# Patient Record
Sex: Male | Born: 1935 | Race: White | Hispanic: No | Smoking: Former smoker
Health system: Southern US, Community
[De-identification: ages and names within clinical notes are randomized; demographics above are authoritative.]

## PROBLEM LIST (undated history)

## (undated) DIAGNOSIS — I714 Abdominal aortic aneurysm, without rupture, unspecified: Secondary | ICD-10-CM

## (undated) DIAGNOSIS — I2510 Atherosclerotic heart disease of native coronary artery without angina pectoris: Secondary | ICD-10-CM

## (undated) DIAGNOSIS — M1990 Unspecified osteoarthritis, unspecified site: Secondary | ICD-10-CM

## (undated) DIAGNOSIS — E785 Hyperlipidemia, unspecified: Secondary | ICD-10-CM

## (undated) DIAGNOSIS — H3530 Unspecified macular degeneration: Secondary | ICD-10-CM

## (undated) DIAGNOSIS — K449 Diaphragmatic hernia without obstruction or gangrene: Secondary | ICD-10-CM

## (undated) DIAGNOSIS — I10 Essential (primary) hypertension: Secondary | ICD-10-CM

## (undated) DIAGNOSIS — D649 Anemia, unspecified: Secondary | ICD-10-CM

## (undated) DIAGNOSIS — R1310 Dysphagia, unspecified: Secondary | ICD-10-CM

## (undated) DIAGNOSIS — I739 Peripheral vascular disease, unspecified: Secondary | ICD-10-CM

## (undated) DIAGNOSIS — C801 Malignant (primary) neoplasm, unspecified: Secondary | ICD-10-CM

## (undated) DIAGNOSIS — K219 Gastro-esophageal reflux disease without esophagitis: Secondary | ICD-10-CM

## (undated) HISTORY — DX: Abdominal aortic aneurysm, without rupture, unspecified: I71.40

## (undated) HISTORY — DX: Atherosclerotic heart disease of native coronary artery without angina pectoris: I25.10

## (undated) HISTORY — DX: Peripheral vascular disease, unspecified: I73.9

## (undated) HISTORY — PX: SPINE SURGERY: SHX786

## (undated) HISTORY — DX: Unspecified osteoarthritis, unspecified site: M19.90

## (undated) HISTORY — DX: Unspecified macular degeneration: H35.30

## (undated) HISTORY — DX: Anemia, unspecified: D64.9

## (undated) HISTORY — DX: Gastro-esophageal reflux disease without esophagitis: K21.9

## (undated) HISTORY — PX: CORONARY ANGIOPLASTY WITH STENT PLACEMENT: SHX49

## (undated) HISTORY — DX: Essential (primary) hypertension: I10

## (undated) HISTORY — PX: TONSILLECTOMY: SUR1361

## (undated) HISTORY — DX: Diaphragmatic hernia without obstruction or gangrene: K44.9

## (undated) HISTORY — DX: Dysphagia, unspecified: R13.10

## (undated) HISTORY — DX: Abdominal aortic aneurysm, without rupture: I71.4

## (undated) HISTORY — DX: Malignant (primary) neoplasm, unspecified: C80.1

## (undated) HISTORY — PX: EUS: SHX5427

## (undated) HISTORY — DX: Hyperlipidemia, unspecified: E78.5

---

## 1999-04-23 ENCOUNTER — Encounter: Payer: Self-pay | Admitting: Emergency Medicine

## 1999-04-23 ENCOUNTER — Emergency Department (HOSPITAL_COMMUNITY): Admission: EM | Admit: 1999-04-23 | Discharge: 1999-04-23 | Payer: Self-pay | Admitting: Emergency Medicine

## 1999-11-07 ENCOUNTER — Encounter: Payer: Self-pay | Admitting: Internal Medicine

## 1999-11-07 ENCOUNTER — Inpatient Hospital Stay (HOSPITAL_COMMUNITY): Admission: EM | Admit: 1999-11-07 | Discharge: 1999-11-08 | Payer: Self-pay | Admitting: Emergency Medicine

## 2000-01-06 ENCOUNTER — Encounter: Admission: RE | Admit: 2000-01-06 | Discharge: 2000-01-06 | Payer: Self-pay | Admitting: Urology

## 2000-01-06 ENCOUNTER — Encounter: Payer: Self-pay | Admitting: Urology

## 2001-07-01 ENCOUNTER — Encounter: Admission: RE | Admit: 2001-07-01 | Discharge: 2001-07-01 | Payer: Self-pay | Admitting: Urology

## 2001-07-01 ENCOUNTER — Encounter: Payer: Self-pay | Admitting: Urology

## 2001-08-05 ENCOUNTER — Ambulatory Visit (HOSPITAL_COMMUNITY): Admission: RE | Admit: 2001-08-05 | Discharge: 2001-08-05 | Payer: Self-pay | Admitting: *Deleted

## 2001-11-30 ENCOUNTER — Encounter: Payer: Self-pay | Admitting: Emergency Medicine

## 2001-11-30 ENCOUNTER — Encounter: Payer: Self-pay | Admitting: Internal Medicine

## 2001-11-30 ENCOUNTER — Inpatient Hospital Stay (HOSPITAL_COMMUNITY): Admission: EM | Admit: 2001-11-30 | Discharge: 2001-12-06 | Payer: Self-pay | Admitting: Emergency Medicine

## 2001-12-01 ENCOUNTER — Encounter: Payer: Self-pay | Admitting: Internal Medicine

## 2001-12-02 ENCOUNTER — Encounter: Payer: Self-pay | Admitting: Internal Medicine

## 2003-04-20 ENCOUNTER — Inpatient Hospital Stay (HOSPITAL_COMMUNITY): Admission: EM | Admit: 2003-04-20 | Discharge: 2003-04-22 | Payer: Self-pay | Admitting: Emergency Medicine

## 2003-08-05 ENCOUNTER — Emergency Department (HOSPITAL_COMMUNITY): Admission: EM | Admit: 2003-08-05 | Discharge: 2003-08-05 | Payer: Self-pay | Admitting: Emergency Medicine

## 2003-08-05 ENCOUNTER — Encounter: Payer: Self-pay | Admitting: Emergency Medicine

## 2004-04-20 ENCOUNTER — Emergency Department (HOSPITAL_COMMUNITY): Admission: EM | Admit: 2004-04-20 | Discharge: 2004-04-20 | Payer: Self-pay | Admitting: Emergency Medicine

## 2004-08-30 ENCOUNTER — Ambulatory Visit (HOSPITAL_COMMUNITY): Admission: RE | Admit: 2004-08-30 | Discharge: 2004-08-30 | Payer: Self-pay | Admitting: Orthopedic Surgery

## 2004-09-28 ENCOUNTER — Emergency Department (HOSPITAL_COMMUNITY): Admission: EM | Admit: 2004-09-28 | Discharge: 2004-09-28 | Payer: Self-pay | Admitting: *Deleted

## 2005-02-01 ENCOUNTER — Emergency Department (HOSPITAL_COMMUNITY): Admission: EM | Admit: 2005-02-01 | Discharge: 2005-02-01 | Payer: Self-pay | Admitting: Emergency Medicine

## 2005-05-29 ENCOUNTER — Inpatient Hospital Stay (HOSPITAL_COMMUNITY): Admission: AD | Admit: 2005-05-29 | Discharge: 2005-06-01 | Payer: Self-pay | Admitting: Cardiovascular Disease

## 2005-05-29 ENCOUNTER — Emergency Department (HOSPITAL_COMMUNITY): Admission: EM | Admit: 2005-05-29 | Discharge: 2005-05-29 | Payer: Self-pay | Admitting: Emergency Medicine

## 2005-06-05 ENCOUNTER — Ambulatory Visit: Payer: Self-pay | Admitting: Internal Medicine

## 2005-06-07 HISTORY — PX: ESOPHAGOGASTRODUODENOSCOPY: SHX1529

## 2005-06-25 ENCOUNTER — Ambulatory Visit: Payer: Self-pay | Admitting: Internal Medicine

## 2005-06-25 ENCOUNTER — Encounter: Payer: Self-pay | Admitting: Internal Medicine

## 2005-06-25 ENCOUNTER — Ambulatory Visit (HOSPITAL_COMMUNITY): Admission: RE | Admit: 2005-06-25 | Discharge: 2005-06-25 | Payer: Self-pay | Admitting: Internal Medicine

## 2005-09-07 HISTORY — PX: ESOPHAGOGASTRODUODENOSCOPY: SHX1529

## 2005-09-08 ENCOUNTER — Ambulatory Visit (HOSPITAL_COMMUNITY): Admission: RE | Admit: 2005-09-08 | Discharge: 2005-09-08 | Payer: Self-pay | Admitting: Internal Medicine

## 2005-09-08 ENCOUNTER — Ambulatory Visit: Payer: Self-pay | Admitting: Internal Medicine

## 2006-12-08 HISTORY — PX: ESOPHAGOGASTRODUODENOSCOPY: SHX1529

## 2007-02-12 ENCOUNTER — Ambulatory Visit (HOSPITAL_COMMUNITY): Admission: RE | Admit: 2007-02-12 | Discharge: 2007-02-12 | Payer: Self-pay | Admitting: Cardiovascular Disease

## 2007-06-29 ENCOUNTER — Inpatient Hospital Stay (HOSPITAL_COMMUNITY): Admission: EM | Admit: 2007-06-29 | Discharge: 2007-07-02 | Payer: Self-pay | Admitting: Emergency Medicine

## 2007-07-07 ENCOUNTER — Encounter: Admission: RE | Admit: 2007-07-07 | Discharge: 2007-07-07 | Payer: Self-pay | Admitting: Internal Medicine

## 2007-07-15 ENCOUNTER — Ambulatory Visit: Payer: Self-pay | Admitting: Internal Medicine

## 2007-07-26 ENCOUNTER — Ambulatory Visit: Payer: Self-pay | Admitting: Internal Medicine

## 2007-07-26 ENCOUNTER — Ambulatory Visit (HOSPITAL_COMMUNITY): Admission: RE | Admit: 2007-07-26 | Discharge: 2007-07-26 | Payer: Self-pay | Admitting: Internal Medicine

## 2008-04-02 ENCOUNTER — Emergency Department (HOSPITAL_COMMUNITY): Admission: EM | Admit: 2008-04-02 | Discharge: 2008-04-02 | Payer: Self-pay | Admitting: Emergency Medicine

## 2008-08-16 ENCOUNTER — Emergency Department (HOSPITAL_COMMUNITY): Admission: EM | Admit: 2008-08-16 | Discharge: 2008-08-16 | Payer: Self-pay | Admitting: Emergency Medicine

## 2009-04-22 ENCOUNTER — Emergency Department (HOSPITAL_COMMUNITY): Admission: EM | Admit: 2009-04-22 | Discharge: 2009-04-22 | Payer: Self-pay | Admitting: Emergency Medicine

## 2009-07-30 ENCOUNTER — Encounter (INDEPENDENT_AMBULATORY_CARE_PROVIDER_SITE_OTHER): Payer: Self-pay | Admitting: *Deleted

## 2009-08-08 HISTORY — PX: COLONOSCOPY: SHX5424

## 2009-08-09 DIAGNOSIS — G459 Transient cerebral ischemic attack, unspecified: Secondary | ICD-10-CM | POA: Insufficient documentation

## 2009-08-09 DIAGNOSIS — Z9861 Coronary angioplasty status: Secondary | ICD-10-CM

## 2009-08-09 DIAGNOSIS — H3530 Unspecified macular degeneration: Secondary | ICD-10-CM | POA: Insufficient documentation

## 2009-08-09 DIAGNOSIS — E7800 Pure hypercholesterolemia, unspecified: Secondary | ICD-10-CM

## 2009-08-09 DIAGNOSIS — Z87898 Personal history of other specified conditions: Secondary | ICD-10-CM | POA: Insufficient documentation

## 2009-08-09 DIAGNOSIS — I2510 Atherosclerotic heart disease of native coronary artery without angina pectoris: Secondary | ICD-10-CM

## 2009-08-09 DIAGNOSIS — M129 Arthropathy, unspecified: Secondary | ICD-10-CM | POA: Insufficient documentation

## 2009-08-09 DIAGNOSIS — K219 Gastro-esophageal reflux disease without esophagitis: Secondary | ICD-10-CM | POA: Insufficient documentation

## 2009-08-09 DIAGNOSIS — I10 Essential (primary) hypertension: Secondary | ICD-10-CM

## 2009-08-10 ENCOUNTER — Ambulatory Visit: Payer: Self-pay | Admitting: Internal Medicine

## 2009-08-10 DIAGNOSIS — K5909 Other constipation: Secondary | ICD-10-CM | POA: Insufficient documentation

## 2009-08-10 DIAGNOSIS — K921 Melena: Secondary | ICD-10-CM | POA: Insufficient documentation

## 2009-08-14 ENCOUNTER — Encounter: Payer: Self-pay | Admitting: Internal Medicine

## 2009-08-17 ENCOUNTER — Ambulatory Visit: Payer: Self-pay | Admitting: Internal Medicine

## 2009-08-17 ENCOUNTER — Encounter: Payer: Self-pay | Admitting: Internal Medicine

## 2009-08-17 ENCOUNTER — Ambulatory Visit (HOSPITAL_COMMUNITY): Admission: RE | Admit: 2009-08-17 | Discharge: 2009-08-17 | Payer: Self-pay | Admitting: Internal Medicine

## 2009-08-20 ENCOUNTER — Encounter: Payer: Self-pay | Admitting: Internal Medicine

## 2009-11-07 HISTORY — PX: ESOPHAGOGASTRODUODENOSCOPY: SHX1529

## 2009-11-12 ENCOUNTER — Emergency Department (HOSPITAL_COMMUNITY): Admission: EM | Admit: 2009-11-12 | Discharge: 2009-11-12 | Payer: Self-pay | Admitting: Emergency Medicine

## 2009-11-13 ENCOUNTER — Ambulatory Visit: Payer: Self-pay | Admitting: Internal Medicine

## 2009-11-14 ENCOUNTER — Encounter: Payer: Self-pay | Admitting: Internal Medicine

## 2009-11-15 DIAGNOSIS — R1319 Other dysphagia: Secondary | ICD-10-CM | POA: Insufficient documentation

## 2009-11-21 ENCOUNTER — Ambulatory Visit (HOSPITAL_COMMUNITY): Admission: RE | Admit: 2009-11-21 | Discharge: 2009-11-21 | Payer: Self-pay | Admitting: Internal Medicine

## 2009-11-21 ENCOUNTER — Ambulatory Visit: Payer: Self-pay | Admitting: Internal Medicine

## 2010-04-05 ENCOUNTER — Emergency Department (HOSPITAL_COMMUNITY): Admission: EM | Admit: 2010-04-05 | Discharge: 2010-04-05 | Payer: Self-pay | Admitting: Emergency Medicine

## 2010-08-20 ENCOUNTER — Inpatient Hospital Stay (HOSPITAL_COMMUNITY): Admission: EM | Admit: 2010-08-20 | Discharge: 2010-08-23 | Payer: Self-pay | Admitting: Emergency Medicine

## 2010-08-26 ENCOUNTER — Emergency Department (HOSPITAL_COMMUNITY): Admission: EM | Admit: 2010-08-26 | Discharge: 2010-08-26 | Payer: Self-pay | Admitting: Emergency Medicine

## 2010-12-03 ENCOUNTER — Emergency Department (HOSPITAL_COMMUNITY)
Admission: EM | Admit: 2010-12-03 | Discharge: 2010-12-03 | Payer: Self-pay | Source: Home / Self Care | Admitting: Emergency Medicine

## 2010-12-17 ENCOUNTER — Ambulatory Visit
Admission: RE | Admit: 2010-12-17 | Discharge: 2010-12-17 | Payer: Self-pay | Source: Home / Self Care | Attending: Vascular Surgery | Admitting: Vascular Surgery

## 2010-12-29 ENCOUNTER — Encounter: Payer: Self-pay | Admitting: Internal Medicine

## 2010-12-29 ENCOUNTER — Encounter: Payer: Self-pay | Admitting: Cardiovascular Disease

## 2011-02-17 LAB — CBC
HCT: 34 % — ABNORMAL LOW (ref 39.0–52.0)
MCHC: 35 g/dL (ref 30.0–36.0)
Platelets: 125 10*3/uL — ABNORMAL LOW (ref 150–400)
RBC: 3.81 MIL/uL — ABNORMAL LOW (ref 4.22–5.81)
WBC: 5 10*3/uL (ref 4.0–10.5)

## 2011-02-17 LAB — BASIC METABOLIC PANEL
GFR calc Af Amer: >60 mL/min (ref >60)
Glucose, Bld: 115 mg/dL — ABNORMAL HIGH (ref 70–99)
Potassium: 4.4 mEq/L (ref 3.5–5.1)

## 2011-02-17 LAB — DIFFERENTIAL
Basophils Absolute: 0 10*3/uL (ref 0.0–0.1)
Basophils Relative: 1 % (ref 0–1)
Eosinophils Absolute: 0.1 10*3/uL (ref 0.0–0.7)
Neutrophils Relative %: 66 % (ref 43–77)

## 2011-02-20 ENCOUNTER — Inpatient Hospital Stay (HOSPITAL_COMMUNITY)
Admission: RE | Admit: 2011-02-20 | Discharge: 2011-02-22 | DRG: 238 | Disposition: A | Discharge status: Home / Self Care | Payer: Medicare Other | Source: Ambulatory Visit | Attending: Vascular Surgery | Admitting: Vascular Surgery

## 2011-02-20 ENCOUNTER — Ambulatory Visit (HOSPITAL_COMMUNITY): Payer: Medicare Other

## 2011-02-20 DIAGNOSIS — I739 Peripheral vascular disease, unspecified: Secondary | ICD-10-CM | POA: Diagnosis present

## 2011-02-20 DIAGNOSIS — Y9210 Unspecified residential institution as the place of occurrence of the external cause: Secondary | ICD-10-CM | POA: Diagnosis not present

## 2011-02-20 DIAGNOSIS — R339 Retention of urine, unspecified: Secondary | ICD-10-CM | POA: Diagnosis not present

## 2011-02-20 DIAGNOSIS — N401 Enlarged prostate with lower urinary tract symptoms: Secondary | ICD-10-CM | POA: Diagnosis not present

## 2011-02-20 DIAGNOSIS — I2510 Atherosclerotic heart disease of native coronary artery without angina pectoris: Secondary | ICD-10-CM | POA: Diagnosis present

## 2011-02-20 DIAGNOSIS — I723 Aneurysm of iliac artery: Principal | ICD-10-CM | POA: Diagnosis present

## 2011-02-20 DIAGNOSIS — Z7902 Long term (current) use of antithrombotics/antiplatelets: Secondary | ICD-10-CM

## 2011-02-20 DIAGNOSIS — Z7982 Long term (current) use of aspirin: Secondary | ICD-10-CM

## 2011-02-20 DIAGNOSIS — I714 Abdominal aortic aneurysm, without rupture: Secondary | ICD-10-CM

## 2011-02-20 DIAGNOSIS — T81509A Unspecified complication of foreign body accidentally left in body following unspecified procedure, initial encounter: Secondary | ICD-10-CM

## 2011-02-20 DIAGNOSIS — K219 Gastro-esophageal reflux disease without esophagitis: Secondary | ICD-10-CM | POA: Diagnosis present

## 2011-02-20 DIAGNOSIS — Y831 Surgical operation with implant of artificial internal device as the cause of abnormal reaction of the patient, or of later complication, without mention of misadventure at the time of the procedure: Secondary | ICD-10-CM | POA: Diagnosis not present

## 2011-02-20 DIAGNOSIS — IMO0002 Reserved for concepts with insufficient information to code with codable children: Secondary | ICD-10-CM | POA: Diagnosis not present

## 2011-02-20 DIAGNOSIS — N138 Other obstructive and reflux uropathy: Secondary | ICD-10-CM | POA: Diagnosis not present

## 2011-02-20 DIAGNOSIS — T82598A Other mechanical complication of other cardiac and vascular devices and implants, initial encounter: Secondary | ICD-10-CM | POA: Diagnosis not present

## 2011-02-20 LAB — BASIC METABOLIC PANEL
BUN: 10 mg/dL (ref 6–23)
BUN: 9 mg/dL (ref 6–23)
CO2: 27 mEq/L (ref 19–32)
Calcium: 8.3 mg/dL — ABNORMAL LOW (ref 8.4–10.5)
Chloride: 99 mEq/L (ref 96–112)
Chloride: 94 mEq/L — ABNORMAL LOW (ref 96–112)
Creatinine, Ser: 0.79 mg/dL (ref 0.4–1.5)
Creatinine, Ser: 0.94 mg/dL (ref 0.4–1.5)
GFR calc Af Amer: >60 mL/min (ref >60)
GFR calc Af Amer: >60 mL/min (ref >60)
GFR calc non Af Amer: >60 mL/min (ref >60)
Glucose, Bld: 102 mg/dL — ABNORMAL HIGH (ref 70–99)
Glucose, Bld: 115 mg/dL — ABNORMAL HIGH (ref 70–99)
Potassium: 3.7 mEq/L (ref 3.5–5.1)
Potassium: 3.8 mEq/L (ref 3.5–5.1)
Potassium: 4 mEq/L (ref 3.5–5.1)
Sodium: 129 mEq/L — ABNORMAL LOW (ref 135–145)

## 2011-02-20 LAB — CBC
HCT: 31.7 % — ABNORMAL LOW (ref 39.0–52.0)
HCT: 32.3 % — ABNORMAL LOW (ref 39.0–52.0)
Hemoglobin: 11.6 g/dL — ABNORMAL LOW (ref 13.0–17.0)
MCH: 32.7 pg (ref 26.0–34.0)
MCH: 32.8 pg (ref 26.0–34.0)
MCHC: 34.6 g/dL (ref 30.0–36.0)
MCV: 93.4 fL (ref 78.0–100.0)
Platelets: 146 10*3/uL — ABNORMAL LOW (ref 150–400)
Platelets: 173 10*3/uL (ref 150–400)
RBC: 3.46 MIL/uL — ABNORMAL LOW (ref 4.22–5.81)
RBC: 3.5 MIL/uL — ABNORMAL LOW (ref 4.22–5.81)
RDW: 14.1 % (ref 11.5–15.5)
WBC: 6.7 10*3/uL (ref 4.0–10.5)
WBC: 9.2 10*3/uL (ref 4.0–10.5)

## 2011-02-20 LAB — DIFFERENTIAL
Basophils Absolute: 0 10*3/uL (ref 0.0–0.1)
Basophils Relative: 0 % (ref 0–1)
Eosinophils Absolute: 0.1 10*3/uL (ref 0.0–0.7)
Eosinophils Absolute: 0 10*3/uL (ref 0.0–0.7)
Eosinophils Relative: 2 % (ref 0–5)
Eosinophils Relative: 0 % (ref 0–5)
Lymphocytes Relative: 16 % (ref 12–46)
Lymphocytes Relative: 17 % (ref 12–46)
Lymphs Abs: 1.1 10*3/uL (ref 0.7–4.0)
Lymphs Abs: 0.6 10*3/uL — ABNORMAL LOW (ref 0.7–4.0)
Monocytes Absolute: 0.9 10*3/uL (ref 0.1–1.0)
Monocytes Relative: 10 % (ref 3–12)
Monocytes Relative: 10 % (ref 3–12)
Monocytes Relative: 9 % (ref 3–12)
Neutro Abs: 4.8 10*3/uL (ref 1.7–7.7)
Neutro Abs: 6.6 10*3/uL (ref 1.7–7.7)
Neutrophils Relative %: 71 % (ref 43–77)

## 2011-02-20 LAB — POCT I-STAT, CHEM 8
Calcium, Ion: 1.15 mmol/L (ref 1.12–1.32)
Chloride: 96 mEq/L (ref 96–112)
Creatinine, Ser: 0.9 mg/dL (ref 0.4–1.5)
Glucose, Bld: 91 mg/dL (ref 70–99)
Potassium: 3.9 mEq/L (ref 3.5–5.1)
Sodium: 131 mEq/L — ABNORMAL LOW (ref 135–145)

## 2011-02-20 LAB — URINE CULTURE

## 2011-02-20 LAB — URINE MICROSCOPIC-ADD ON

## 2011-02-20 LAB — LIPID PANEL
Cholesterol: 118 mg/dL (ref 0–200)
Total CHOL/HDL Ratio: 2.5 RATIO
VLDL: 8 mg/dL (ref 0–40)

## 2011-02-20 LAB — URINALYSIS, ROUTINE W REFLEX MICROSCOPIC: Glucose, UA: NEGATIVE mg/dL

## 2011-02-21 LAB — BASIC METABOLIC PANEL
BUN: 9 mg/dL (ref 6–23)
CO2: 26 mEq/L (ref 19–32)
CO2: 25 mEq/L (ref 19–32)
Calcium: 8.2 mg/dL — ABNORMAL LOW (ref 8.4–10.5)
GFR calc non Af Amer: >60 mL/min (ref >60)
Glucose, Bld: 106 mg/dL — ABNORMAL HIGH (ref 70–99)
Glucose, Bld: 120 mg/dL — ABNORMAL HIGH (ref 70–99)
Potassium: 3.9 mEq/L (ref 3.5–5.1)
Sodium: 125 mEq/L — ABNORMAL LOW (ref 135–145)

## 2011-02-21 LAB — CBC
HCT: 33.6 % — ABNORMAL LOW (ref 39.0–52.0)
Hemoglobin: 11.6 g/dL — ABNORMAL LOW (ref 13.0–17.0)
MCH: 31 pg (ref 26.0–34.0)
MCHC: 34.5 g/dL (ref 30.0–36.0)

## 2011-02-22 LAB — URINALYSIS, ROUTINE W REFLEX MICROSCOPIC
Bilirubin Urine: NEGATIVE
Glucose, UA: NEGATIVE mg/dL
Ketones, ur: NEGATIVE mg/dL
Nitrite: NEGATIVE
pH: 6.5 (ref 5.0–8.0)

## 2011-02-23 LAB — URINE CULTURE
Colony Count: NO GROWTH
Culture  Setup Time: 201203171746
Culture: NO GROWTH

## 2011-02-23 NOTE — Consult Note (Addendum)
  [name], [name] NO.:  [id number]  MEDICAL RECORD NO.:  [id number]           PATIENT TYPE:  LOCATION:                                 FACILITY:  PHYSICIAN:  Jeremy Purpura, MD      DATE OF BIRTH:  09-29-1918  DATE OF CONSULTATION:  02/21/2011 DATE OF DISCHARGE:                                CONSULTATION   REASON FOR CONSULTATION:  Urinary retention.  PHYSICIAN REQUESTING CONSULTATION:  Jeremy Hertz, MD  HISTORY:  [name] is a 77 year old gentleman who has been followed by [name] for benign prostatic hyperplasia.  He chronically takes Uroxatral and finasteride for benign prostatic hyperplasia.  On February 20, 2011, he underwent a right internal iliac arterial embolization and subsequently underwent removal of a migrated coil which had migrated into the right common femoral artery.  He subsequently underwent a voiding trial which he failed.  His Foley catheter was replaced, and he was given a second voiding trial which he also failed requiring Foley catheter placement again.  PAST MEDICAL HISTORY: 1. Hypertension. 2. Peripheral vascular disease. 3. Dyslipidemia. 4. Coronary artery disease status post cardiac stent and angioplasty. 5. Gastroesophageal reflux disease. 6. Benign prostatic hyperplasia. 7. Macular degeneration. 8. History of transient ischemic attack.  PAST SURGICAL HISTORY: 1. Tonsillectomy. 2. History of cardiac stent placement.  MEDICATIONS:  Current medications include Uroxatral, Norvasc, aspirin, Plavix, ferrous sulfate, finasteride, metoprolol, omega-3 fatty acid, Protonix, Crestor.  ALLERGIES:  HYDROCODONE results in GI upset but no true drug allergy.  FAMILY HISTORY:  There is a maternal history of congestive heart failure.  The patient's father died at age 76 secondary to pneumonia.  SOCIAL HISTORY:  He is widowed.  He is retired.  He has a remote history of smoking.  He denies alcohol use.  REVIEW OF  SYSTEMS:  Complete review of systems was reviewed.  Pertinent positives are as included in the history of present illness.  PHYSICAL EXAMINATION:  CONSTITUTIONAL:  A well-nourished, well- developed, age-appropriate male, in no acute distress. VITAL SIGNS:  Temperature 99.5, heart rate 79, blood pressure 154/75. NECK:  Supple without JVD. CARDIOVASCULAR:  Regular rate and rhythm. LUNGS:  Normal respiratory effort. ABDOMEN:  No suprapubic distention. GENITOURINARY:  The patient has an indwelling Foley catheter draining grossly clear urine. EXTREMITIES:  Nontender. NEUROLOGIC:  Grossly intact.  IMPRESSION:  Postoperative urinary retention.  PLAN:  I think it would be appropriate to give him one more chance of voiding while hospitalized tomorrow morning.  If he fails this voiding trial, I would then recommend replacement of his Foley catheter and discharge home with an indwelling Foley catheter.  He should continue both his Uroxatral and finasteride.  If he does not pass his voiding trial, he should then follow up in the next 1-2 weeks for an outpatient voiding trial.     Jeremy Purpura, MD     LB/MEDQ  D:  02/21/2011  T:  02/22/2011  Job:  [id number]  cc:   Jeremy Hertz, MD  Electronically Signed by Jeremy Purpura MD on 02/23/2011 04:44:39 PM

## 2011-02-25 ENCOUNTER — Ambulatory Visit (INDEPENDENT_AMBULATORY_CARE_PROVIDER_SITE_OTHER): Payer: Medicare Other | Admitting: Vascular Surgery

## 2011-02-25 DIAGNOSIS — R109 Unspecified abdominal pain: Secondary | ICD-10-CM

## 2011-02-25 NOTE — Op Note (Signed)
[name], [name] NO.:  [id number]  MEDICAL RECORD NO.:  [id number]           PATIENT TYPE:  I  LOCATION:  2030                         FACILITY:  [facility]  PHYSICIAN:  Jeremy Hertz, MD       DATE OF BIRTH:  [birth year]  DATE OF PROCEDURE:  02/20/2011 DATE OF DISCHARGE:                              OPERATIVE REPORT   PROCEDURE:  Right common femoral artery exploration and removal of [name] coil from right common femoral artery.  PREOPERATIVE DIAGNOSIS:  Right common femoral artery coil migration.  POSTOPERATIVE DIAGNOSIS:  Right common femoral artery coil migration.  SURGEON:  Jeremy John L. Imogene Burn, MD  ASSISTANT:  Jeremy Earthly, MD  ANESTHESIA:  General.  FINDINGS:  [name] coil in right common femoral artery which was removed in its entirety and posterior plaquing in the right common femoral artery with an intact common femoral artery lumen.  SPECIMEN:  [name] coil.  Disposition of the specimen was to put pathology.  ESTIMATED BLOOD LOSS:  Minimal.  INDICATIONS:  This is a 77 year old gentleman that had undergone a right internal iliac artery embolization earlier today with inadvertent migration of a coil down into the right common femoral artery.  The patient was brought back to the operating room with a plan to remove this inadvertently migrant [name] coil to maintain perfusion in his right leg.  The patient is aware of the risks of this procedure including bleeding, infection, and possible need for additional surgical procedures depending on the findings intraoperatively.  DESCRIPTION OF OPERATION:  After full informed written consent had been obtained from the patient's family, the patient was brought back to the operating room and placed supine upon the operating table.  Prior to induction, he had received IV antibiotics.  He was then prepped and draped in standard fashion for right femoral artery cutdown.  I made a longitudinal incision over the  right common femoral artery and dissected down to the artery using blunt dissection with electrocautery.  Note, there was an extensive amount of inflammatory changes around the right common femoral artery.  I was able to dissect out a segment at the level of the [name] coil proximally and distally and gained control for vascular clamp placement.  I gave 5000 units of heparin intravenously and  then after waiting 3 minutes, I then made an arteriotomy with a #11 blade, extending it slightly with a Potts scissor.  Immediately, I could see the [name] coil.  The Potts scissor had cut part of the coil but I was able to extract all 3 pieces.  Under fluoroscopic guidance, we re- interrogated the right groin and there was no coil fragment present.  At this point, we also looked further down in the leg and there was no evidence of any embolized coil material.  At this point, I irrigated out this artery and then it was closed with a running stitch of 6-0 Prolene. The groin was then irrigated out.  I then repaired the femoral sheath with  a 2-0 Vicryl and then an additional layer of 2-0 Vicryl in the deep subcutaneous  tissue was placed.  Then the subcutaneous tissue was further reapproximated with  a double layer of 3-0 Vicryl and then the skin was cleaned, dried, and a 4-0 Monocryl  was used to reapproximate the skin.  The skin closure was then reinforced with Dermabond.  At this point, the patient was allowed to awaken without any difficulties.  The plan is to admit him for observation overnight and  then discharge in the morning.  There were no complications in this case.  The patient was in stable condition.       Jeremy Hertz, MD     BLC/MEDQ  D:  02/20/2011  T:  02/21/2011  Job:  [id number]  Electronically Signed by Jeremy Sake MD on 02/25/2011 10:55:07 AM

## 2011-02-25 NOTE — Op Note (Signed)
[name], [name] NO.:  [id number]  MEDICAL RECORD NO.:  [id number]           PATIENT TYPE:  I  LOCATION:  2030                         FACILITY:  [facility]  PHYSICIAN:  Fransisco Hertz, MD       DATE OF BIRTH:  Dec 25, 1917  DATE OF PROCEDURE:  02/20/2011 DATE OF DISCHARGE:                              OPERATIVE REPORT   PROCEDURES: 1. Left common femoral artery cannulation under ultrasound guidance. 2. Aortogram. 3. Right internal iliac artery embolization with Nestor coils x4.  PREOPERATIVE DIAGNOSIS:  Right common iliac artery aneurysm.  POSTOPERATIVE DIAGNOSIS:  Right common iliac artery aneurysm.  SURGEON:  Fransisco Hertz, MD  ANESTHESIA:  Conscious sedation.  ESTIMATED BLOOD LOSS:  Minimal.  CONTRAST:  175 mL.  FINDINGS:  The findings in this case included: 1. Patent aorta. 2. Patent superior mesenteric artery and iliac artery. 3. No inferior mesenteric artery was visualized. 4. The right common iliac artery aneurysm was visualized about 2 cm in     diameter intraluminally. 5. The right internal iliac artery was embolized with four Nestor     coils. 6. There was accidental migration, the last Nestor coil into the right     common femoral artery with the delivery catheter recoiled and     inadvertently pulled the coil back into the external iliac artery     dislodging the corneal and allowing it to drop down into the right     common femoral artery. 7. At the end of the case, there was decreased, but persistent flow in     the right internal iliac artery at the point of termination of this     case.  INDICATIONS:  This is a 77 year old gentleman with a greater than 4-cm right common iliac artery aneurysm that is in process of being scheduled for endovascular repair.  In order to facilitate an EVAR repair of his aneurysm, he needed the right internal iliac artery coiling procedure completed.  We discussed the risks of this procedure included  access, complications, bleeding, infection, possible embolization, possible need for emergent surgical intervention and possible renal failure due to dye load.  He is aware of these risks and agreed to proceed forward such.  DESCRIPTION OF THE OPERATION:  After full informed written consent was obtained, he was brought back to the angio suite and placed supine upon the angio table.  Prior to induction, he was connected to monitoring equipment and he was given conscious sedation in amounts of which were as documented in his electronic chart.  He was then prepped and draped in standard fashion for possible bilateral femoral access.  I turned my attention first to his left groin.  Under ultrasound guidance, I interrogated the artery on the left side.  There was a good bit of posterior plaque noted, but the common femoral artery appeared to be intact.  I then cannulated this artery with micropuncture needle and passed the microwire up into the aorta.  The needle was exchanged for a micro-sheath, through the micro-sheath a Bentson wire was advanced up into the aorta.  At  this point, the sheath was exchanged for regular 5-French sheath, which was advanced into the common femoral artery.  The dilator was removed and then a pigtail catheter was loaded and advanced to the level of L1.  Power injections were completed via this catheter in both anterior-posterior projection and also lateral projections, this demonstrated patency of the SMA and celiac artery, also identified the location of the renals and then demonstrated no inferior mesenteric artery, and then I pulled down the catheter to the level of the bifurcation and did oblique views on the pelvis to try to rotate the internal iliac artery of the common iliac artery.  In the deep right anterior oblique view, we were able visualize the internal.  I then exchanged the catheter out for a Kumpe catheter.  Using this, I was able to cross the  bifurcation and advanced my wire down into the internal iliac artery without any difficulties.  I was able to track the catheter down into the internal iliac artery.  I then started delivering 10-mm Nestor coils into this artery.  I placed total of 3 Nestors and then did another hand injection.  At this point, I noted that the flow within this internal iliac artery orifice was already decreased based on decreased ability to aspirate, but on the hand injections, there was decreased but persistent flow in this internal iliac artery, so at this point, I felt that an additional coil was going to be necessary.  We started delivering a 10 mm Nestor in the usual fashion as the previous three.  I tried to get better delivery of the coil by manipulating the direction of delivery of the coil.  With manipulation of the catheter, the catheter recoiled superiorly up toward the aortic bifurcation about 2-3 cm.  In this process, this pulled the coil that was being delivered out of the internal iliac artery.  At this point, the coil was already in the common iliac artery and upon releasing the coil, migrated down into the right common femoral artery.  At this point I felt that the priority would be to maintain profusion of his right leg and that there was already some evidence that the left internal iliac artery was in the process of clotting.  So at this point, I terminated the case, and pulled out the catheter and wire and then the left femoral sheath was pulled and after about a 15-minute pressure hold to this artery, the patient was then brought to the operating room emergently for exploration in the right common femoral artery and extraction of this migrant Nestor coil.  Complication was inadvertent migration of coil to right common femoral artery. The condition of the patient was stable.     Fransisco Hertz, MD     BLC/MEDQ  D:  02/20/2011  T:  02/21/2011  Job:  [id number]  Electronically  Signed by Leonides Sake MD on 02/25/2011 10:51:50 AM

## 2011-02-26 NOTE — Assessment & Plan Note (Signed)
OFFICE VISIT  [name], REEP DOB:  06/07/1918                                       02/25/2011 ZOXWR#:[id number]  The patient presents today for followup of recent coiling of his right internal iliac and removal of a coil from his right groin.  He had undergone coiling in preparation for stent graft repair of large right common iliac artery aneurysm.  He had an interim procedural complication of dislodgement of coil and this lodged in his common femoral artery. He was taken to the operating room by Dr. Leonides Sake and had uneventful removal of the coil from the common femoral artery.  He was discharged to home on postop day #2 following this.  He presents today for followup.  He does report some soreness in his groin incision but otherwise is doing fine.  We did discuss the procedure for stent graft repair of his aneurysm and have tentatively scheduled this for 2 weeks on April 2nd.  We will see him one further time between now and then to confirm that he is prepared for surgery.    [name], M.D. Electronically Signed  TFE/MEDQ  D:  02/25/2011  T:  02/26/2011  Job:  [id number]  cc:   Fransisco Hertz, MD

## 2011-03-06 ENCOUNTER — Encounter (HOSPITAL_COMMUNITY)
Admission: RE | Admit: 2011-03-06 | Discharge: 2011-03-06 | Disposition: A | Discharge status: Home / Self Care | Payer: Medicare Other | Source: Ambulatory Visit | Attending: Vascular Surgery | Admitting: Vascular Surgery

## 2011-03-06 ENCOUNTER — Other Ambulatory Visit (HOSPITAL_COMMUNITY): Payer: Medicare Other

## 2011-03-06 ENCOUNTER — Other Ambulatory Visit: Payer: Self-pay | Admitting: Vascular Surgery

## 2011-03-06 ENCOUNTER — Ambulatory Visit (HOSPITAL_COMMUNITY)
Admission: RE | Admit: 2011-03-06 | Discharge: 2011-03-06 | Disposition: A | Discharge status: Home / Self Care | Payer: Medicare Other | Source: Ambulatory Visit | Attending: Vascular Surgery | Admitting: Vascular Surgery

## 2011-03-06 DIAGNOSIS — I714 Abdominal aortic aneurysm, without rupture, unspecified: Secondary | ICD-10-CM | POA: Insufficient documentation

## 2011-03-06 DIAGNOSIS — J4489 Other specified chronic obstructive pulmonary disease: Secondary | ICD-10-CM | POA: Insufficient documentation

## 2011-03-06 DIAGNOSIS — Z01818 Encounter for other preprocedural examination: Secondary | ICD-10-CM | POA: Insufficient documentation

## 2011-03-06 DIAGNOSIS — J449 Chronic obstructive pulmonary disease, unspecified: Secondary | ICD-10-CM | POA: Insufficient documentation

## 2011-03-06 DIAGNOSIS — Z01811 Encounter for preprocedural respiratory examination: Secondary | ICD-10-CM | POA: Insufficient documentation

## 2011-03-06 DIAGNOSIS — Z01812 Encounter for preprocedural laboratory examination: Secondary | ICD-10-CM | POA: Insufficient documentation

## 2011-03-06 LAB — CBC
HCT: 33.3 % — ABNORMAL LOW (ref 39.0–52.0)
Hemoglobin: 11.6 g/dL — ABNORMAL LOW (ref 13.0–17.0)
MCH: 31.4 pg (ref 26.0–34.0)
MCHC: 34.8 g/dL (ref 30.0–36.0)
RBC: 3.7 MIL/uL — ABNORMAL LOW (ref 4.22–5.81)

## 2011-03-06 LAB — BLOOD GAS, ARTERIAL
Acid-Base Excess: 1.4 mmol/L (ref 0.0–2.0)
Bicarbonate: 25.3 mEq/L — ABNORMAL HIGH (ref 20.0–24.0)
O2 Saturation: 97.4 %
Patient temperature: 98.6
TCO2: 26.4 mmol/L (ref 0–100)

## 2011-03-06 LAB — TYPE AND SCREEN: ABO/RH(D): A NEG

## 2011-03-06 LAB — URINALYSIS, ROUTINE W REFLEX MICROSCOPIC
Bilirubin Urine: NEGATIVE
Glucose, UA: NEGATIVE mg/dL
Ketones, ur: NEGATIVE mg/dL
Specific Gravity, Urine: 1.013 (ref 1.005–1.030)
pH: 7 (ref 5.0–8.0)

## 2011-03-06 LAB — SURGICAL PCR SCREEN
MRSA, PCR: NEGATIVE
Staphylococcus aureus: NEGATIVE

## 2011-03-06 LAB — COMPREHENSIVE METABOLIC PANEL
ALT: 19 U/L (ref 0–53)
AST: 26 U/L (ref 0–37)
Alkaline Phosphatase: 63 U/L (ref 39–117)
Calcium: 8.8 mg/dL (ref 8.4–10.5)
GFR calc Af Amer: >60 mL/min (ref >60)
Glucose, Bld: 110 mg/dL — ABNORMAL HIGH (ref 70–99)
Potassium: 4.5 mEq/L (ref 3.5–5.1)
Sodium: 126 mEq/L — ABNORMAL LOW (ref 135–145)
Total Protein: 6.2 g/dL (ref 6.0–8.3)

## 2011-03-06 LAB — ABO/RH: ABO/RH(D): A NEG

## 2011-03-06 LAB — PROTIME-INR
INR: 1.03 (ref 0.00–1.49)
Prothrombin Time: 13.7 seconds (ref 11.6–15.2)

## 2011-03-09 HISTORY — PX: ANGIOPLASTY / STENTING ILIAC: SUR31

## 2011-03-10 ENCOUNTER — Inpatient Hospital Stay (HOSPITAL_COMMUNITY)
Admission: RE | Admit: 2011-03-10 | Discharge: 2011-03-12 | DRG: 238 | Disposition: A | Discharge status: Skilled Nursing Facility | Payer: Medicare Other | Source: Ambulatory Visit | Attending: Vascular Surgery | Admitting: Vascular Surgery

## 2011-03-10 ENCOUNTER — Inpatient Hospital Stay (HOSPITAL_COMMUNITY): Payer: Medicare Other

## 2011-03-10 DIAGNOSIS — Z7982 Long term (current) use of aspirin: Secondary | ICD-10-CM

## 2011-03-10 DIAGNOSIS — I714 Abdominal aortic aneurysm, without rupture, unspecified: Secondary | ICD-10-CM | POA: Diagnosis present

## 2011-03-10 DIAGNOSIS — I723 Aneurysm of iliac artery: Secondary | ICD-10-CM

## 2011-03-10 DIAGNOSIS — I10 Essential (primary) hypertension: Secondary | ICD-10-CM | POA: Diagnosis present

## 2011-03-10 DIAGNOSIS — N400 Enlarged prostate without lower urinary tract symptoms: Secondary | ICD-10-CM | POA: Diagnosis present

## 2011-03-10 DIAGNOSIS — Z8673 Personal history of transient ischemic attack (TIA), and cerebral infarction without residual deficits: Secondary | ICD-10-CM

## 2011-03-10 DIAGNOSIS — E785 Hyperlipidemia, unspecified: Secondary | ICD-10-CM | POA: Diagnosis present

## 2011-03-10 DIAGNOSIS — H3530 Unspecified macular degeneration: Secondary | ICD-10-CM | POA: Diagnosis present

## 2011-03-10 DIAGNOSIS — K219 Gastro-esophageal reflux disease without esophagitis: Secondary | ICD-10-CM | POA: Diagnosis present

## 2011-03-10 LAB — BASIC METABOLIC PANEL
Calcium: 8.7 mg/dL (ref 8.4–10.5)
GFR calc Af Amer: >60 mL/min (ref >60)
GFR calc non Af Amer: >60 mL/min (ref >60)
Potassium: 3.9 mEq/L (ref 3.5–5.1)
Sodium: 130 mEq/L — ABNORMAL LOW (ref 135–145)

## 2011-03-11 LAB — CBC
MCH: 31.3 pg (ref 26.0–34.0)
MCHC: 34.3 g/dL (ref 30.0–36.0)
MCV: 91.5 fL (ref 78.0–100.0)
Platelets: 143 10*3/uL — ABNORMAL LOW (ref 150–400)
RBC: 3.16 MIL/uL — ABNORMAL LOW (ref 4.22–5.81)
RDW: 14.4 % (ref 11.5–15.5)

## 2011-03-11 LAB — BASIC METABOLIC PANEL
BUN: 9 mg/dL (ref 6–23)
Calcium: 8.2 mg/dL — ABNORMAL LOW (ref 8.4–10.5)
Chloride: 97 mEq/L (ref 96–112)
Creatinine, Ser: 0.8 mg/dL (ref 0.4–1.5)
GFR calc Af Amer: >60 mL/min (ref >60)

## 2011-03-12 LAB — CBC
MCV: 89.7 fL (ref 78.0–100.0)
Platelets: 119 10*3/uL — ABNORMAL LOW (ref 150–400)
RDW: 14.4 % (ref 11.5–15.5)
WBC: 6.1 10*3/uL (ref 4.0–10.5)

## 2011-03-12 LAB — BASIC METABOLIC PANEL
BUN: 9 mg/dL (ref 6–23)
Creatinine, Ser: 0.73 mg/dL (ref 0.4–1.5)
GFR calc Af Amer: >60 mL/min (ref >60)
GFR calc non Af Amer: >60 mL/min (ref >60)
Potassium: 3.9 mEq/L (ref 3.5–5.1)

## 2011-03-14 NOTE — Discharge Summary (Addendum)
NAMELAVOY, Jeremy NO.:  [id number]  MEDICAL RECORD NO.:  [id number]           PATIENT TYPE:  I  LOCATION:  2034                         FACILITY:  [facility]  PHYSICIAN:  [name], M.D.    DATE OF BIRTH:  1918-01-17  DATE OF ADMISSION:  03/10/2011 DATE OF DISCHARGE:  03/12/2011                              DISCHARGE SUMMARY   CHIEF COMPLAINT:  Right common iliac artery aneurysm.  HISTORY OF PRESENT ILLNESS:  [name] is a 77 year old gentleman with greater than 4-cm right common iliac artery aneurysm as well as an abdominal aortic aneurysm.  He is being scheduled for an endovascular repair and previous to this he had an right internal iliac artery coil procedure.  The patient was admitted for endovascular repair of these aneurysms.  PAST MEDICAL HISTORY.: 1. Hypertension. 2. Hyperlipidemia. 3. Large right common iliac artery aneurysm status post right internal     iliac artery embolization with coils and right common femoral     artery exploration with removal of [name] coil. 4. BPH. 5. Gastroesophageal reflex 6. Macular degeneration 7. History of TIA.  HOSPITAL COURSE:  The patient was taken to the operating room on March 10, 2011 for a Gore stent graft of right iliac artery aneurysm and coiling of right internal iliac artery.  The patient's main body device was 31 x 14 x 15 cm.  He had 3 Nestor coils, 14 x 10 mm coils, in the internal iliac artery.  Contralateral limb of the graft was 20 x 9.5 cm and an extension on the other side was 10 x 7 cm.  Postoperatively, the patient did well.  Vital signs were stable.  His hemoglobin and hematocrit were 10 and 29.  He was begun on ambulation.  He had some issues with urinary retention but was able to void in 100 mL amounts. He will be discharged on March 12, 2011.  FINAL DIAGNOSIS: 1. Large right iliac artery aneurysm with abdominal aortic aneurysm     status post Gore Excluder graft. 2. Benign  prostatic hypertrophy.  Some retention issues which resolved     prior to discharge. All his other medical issues were stable while in-house.  DISCHARGE MEDICATIONS: 1. Oxycodone 5 mg 1-2 tablets every 6 hours as needed for pain. 2. Amlodipine 5 mg daily. 3. Aspirin 81 mg daily. 4. Finasteride 5 mg daily. 5. Fish oil 1000 mg daily. 6. Eye vitamins twice daily. 7. Iron 65 mg daily. 8. Lipitor 20 mg daily. 9. Metamucil powder every morning. 10.Metoprolol 12.5 mg twice daily. 11.MiraLax 1 scoop at bedtime. 12.Multivitamins daily. 13.Plavix 75 mg daily. 14.Prevacid 30 mg daily. 15.Tylenol Extra Strength as needed for headache or fever. 16.Alfuzosin 10 mg daily bedtime.  DISPOSITION:  The patient will be discharged to home.  He will follow up with Dr. Arbie Cookey in 4 weeks with a CTA of the abdomen and pelvis.     [name], PA-C   ______________________________ [name], M.D.    RR/MEDQ  D:  03/12/2011  T:  03/12/2011  Job:  [id number]  Electronically Signed by [name]  PA on 03/14/2011 09:41:43 AM Electronically Signed by [name] M.D. on 03/17/2011 12:55:39 PM

## 2011-03-14 NOTE — Discharge Summary (Addendum)
NAMEGERRITT, GALENTINE NO.:  [id number]  MEDICAL RECORD NO.:  [id number]           PATIENT TYPE:  O  LOCATION:  XRAY                         FACILITY:  [facility]  PHYSICIAN:  Fransisco Hertz, MD       DATE OF BIRTH:  04-18-1918  DATE OF ADMISSION:  02/20/2011 DATE OF DISCHARGE:  02/22/2011                              DISCHARGE SUMMARY   DIAGNOSIS:  Infrarenal abdominal aortic aneurysm and large right common iliac artery aneurysm.  PAST MEDICAL HISTORY AND DISCHARGE DIAGNOSES: 1. Hypertension. 2. Hyperlipidemia. 3. Large right common iliac artery aneurysm status post right internal     iliac artery embolization with Nestor coils and status post right     common femoral artery exploration, removal of Nestor coils. 4. Postoperative urinary retention resolved. 5. BPH  BRIEF HISTORY:  The patient is a 77 year old male with a greater than 4 cm right common iliac artery aneurysm as well as abdominal aortic aneurysm that is in the process of being scheduled for endovascular repair.  In order to facilitate the repair of his aneurysm, he had to proceed with a right internal iliac artery coil procedure before.  HOSPITAL COURSE:  The patient was admitted and taken to peripheral vascular lab on February 22, 2011 for a left common femoral artery cannulation under ultrasound guidance, aortogram, and right internal iliac artery embolization with Nestor coils x4.  There was accidental migration of the last Nestor coil into the right common femoral artery with the delivery catheter recoiled and inadvertently pulling the coil back into the external iliac artery.  The coil could not be removed in the peripheral vascular lab.  Therefore, the patient was taken to the OR on February 22, 2011 for right common femoral artery exploration and removal of the Nestor coil from the right common femoral artery.  The patient tolerated both procedures well and was hemodynamically stable immediately  postoperatively.  The patient was transferred from the OR to the Post Anesthesia Care Unit in stable condition.  The patient was extubated without complication and woke up from anesthesia neurologically intact.  The patient's postoperative course was initially complicated by some nausea and vomiting.  This was controlled with medications and subsequently resolved.  He was then able to tolerate regular diet without difficulty.  He also developed a right groin hematoma that appeared to evolve secondary to straining secondary to urinary retention.  The patient's BPH medications were restarted.  The right groin hematoma stabilized and presented no further issue.  The patient did have acute urinary retention after the Foley was discontinued.  His BPH medications were restarted as previously stated, however, he had a Foley replaced twice.  On February 21, 2011, a urology consult was obtained.  Dr. Laverle Patter suggested that the patient undergo an additional void trial and if he is still unable to void, he can be sent home with Foley catheter in place for an outpatient void trial.   On February 22, 2011 the patient was without complaint.  He had a low-grade fever during the night which had resolved.  A UA was checked which was  negative.  On physical exam, cardiac is regular rate and rhythm.  Lungs are clear to auscultation.  The abdomen was benign.  The right groin was soft and ecchymotic and the bilateral lower extremities were warm and well perfused.  The patient passed a void trial and was able to urinate without difficulty.  His fevers resolved and he was ambulating also without difficulty.  The patient continued to do well and was felt stable for discharge home at that time.  LABORATORY DATA:  CBC on February 21, 2011, white count 7.1, hemoglobin 11.6, hematocrit 33.6, platelets 143,000.  BMP on February 21, 2011 sodium 129, potassium 3.9, BUN 9, creatinine 0.74.  DISCHARGE INSTRUCTIONS:  The patient  was given specific written discharge instructions regarding diet, activity, wound care. He was to follow  up with Dr. Imogene Burn in approx 2 weeks.  DISCHARGE MEDICATIONS: 1. Oxycodone 5 mg 1 p.o. q.4 h. p.r.n. pain. 2. Amlodipine 5 mg daily. 3. Aspirin 81 mg daily. 4. Benazepril 5 mg at bedtime. 5. Fish old daily. 6. ICaps, vitamin, and mineral supplements OTC b.i.d. 7. Iron 65 mg OTC daily. 8. Lipitor 20 mg daily. 9. Metamucil once q.a.m. 10.Metoprolol 12.5 mg b.i.d. 11.MiraLax 1 p.o. at bedtime. 12.Multivitamin daily. 13.Plavix 75 mg daily. 14.Pepcid 30 mg today. 15.Tylenol Extra Strength 500 mg 1 p.o. q.4 h. p.r.n. pain. 16.Uroxatral 10 mg at bedtime.     Pecola Leisure, PA   ______________________________ Fransisco Hertz, MD    AY/MEDQ  D:  03/07/2011  T:  03/08/2011  Job:  [id number]  Electronically Signed by Pecola Leisure PA on 03/25/2011 10:31:38 AM Electronically Signed by Leonides Sake MD on 03/31/2011 10:12:19 AM

## 2011-03-17 NOTE — Op Note (Signed)
[name], [name] NO.:  [id number]  MEDICAL RECORD NO.:  [id number]           PATIENT TYPE:  I  LOCATION:  3307                         FACILITY:  [facility]  PHYSICIAN:  [name], M.D.    DATE OF BIRTH:  11-24-1918  DATE OF PROCEDURE:  03/10/2011 DATE OF DISCHARGE:                              OPERATIVE REPORT   PREOPERATIVE DIAGNOSIS:  Right common iliac artery aneurysm.  POSTOPERATIVE DIAGNOSIS:  Right common iliac artery aneurysm.  PROCEDURE: 1. Gore stent graft repair of right iliac artery aneurysm. 2. Coiling of right internal iliac artery.  SURGEON:  [name], MD  ASSISTANT:  Di Kindle. Edilia Bo, MD and Pecola Leisure, PA-C  ANESTHESIA:  General endotracheal.  COMPLICATIONS:  None.  DISPOSITION:  To recovery room stable.  PROCEDURE IN DETAIL:  The patient was taken to the operating room, placed in a supine position where the right and left groin was prepped and draped in usual sterile fashion.  Abdomen was prepped as well.  The patient had surgery on his right groin on February 20, 2011, when he had dislodgement of coil, was being placed to coil as internal iliac artery on the right preoperatively.  This incision was reopened and carried down to isolate the common femoral artery.  The left common femoral artery was exposed through an oblique incision just above the inguinal crease.  Using Seldinger technique, an 18-gauge needle, guidewire was passed through the right groin up to the level of the suprarenal aorta. An 8-French sheath was passed over this.  Next, a pigtail catheter was positioned over the J-wire, the J-wire was removed and Amplatz super stiff wire was placed through this artery.  Next, the left common femoral artery was accessed with Seldinger technique and a guidewire was passed up the level of suprarenal aorta.  There was some tortuosity in the left iliac.  An 8-French sheath and a pigtail catheter was positioned.   The 22-French sheath was then exchanged in the right groin and positioned up the level of the renal arteries.  The main body device of the graft was a 31 x 14 x 15 cm device.  This was positioned through the 22-French sheath at the level of the renal artery.  Contrast injection at the site revealed the level of renal arteries.  There was persistent flow in the hypogastric artery on the right despite the preoperative coiling.  Decision was made to recoil this.  The pigtail catheter to the left groin was exchanged for a crossover catheter and a guidewire was passed down to the level of the internal iliac artery. With a Comfy guiding catheter and angled glide catheter, the proximal portion of the hypogastric artery on the right was accessed.  The wire continued to roll a proximal branch of this.  The Comfy catheter was exchanged for an end-hole catheter and it was successful in getting the wire down into the internal iliac artery where the prior coils have been placed.  Three Nestor coils, 14 cm x 10 mm coils were placed in the internal iliac artery.  Repeat injection showed markedly  diminished flow through this.  The catheter was removed and the Amplatz wire was replaced up to the level of suprarenal aorta.  The sheath was then placed in the left groin for preparation of placement in the contralateral limb.  The main body of the device was again positioned with the proximal portion just below the takeoff of the renal arteries and the main body was deployed.  Next using a Comfy catheter and angled Glidewire, the contralateral gate was cannulated and this was confirmed by rotating a pigtail catheter in the main body of the device. Retrograde injection through the sheath showed the level of the hypogastric artery.  The contralateral limb was a 20 x 9.5 cm length and this was deployed just above the level of the hypogastric artery.  Next, the iliac extender was positioned in the right to give  covers past the level of the takeoff of the hypogastric artery to exclude the right common femoral artery aneurysm.  The excluder was a 10 x 7 cm length extension.  After placement of these, the dilating balloon was positioned at the level of the proximal and distal attachment in all overlapping extension zones.  Pigtail catheter was repositioned above the level of the renal arteries and a completion catheter injection showed excellent positioning with good flow into the left internal iliac artery and no evidence of endoleak.  The guidewires and sheaths were all removed, the common femoral arteries were occluded and were controlled bilaterally and closed with 5-0 Prolene sutures.  The patient had been given 5000 units of intravenous heparin prior to placement of the large sheath.  This was reversed with 50 mg of protamine.  The wounds were irrigated with saline.  Hemostasis with electrocautery.  Wounds were closed with 3-0 Vicryl in the subcutaneous tissue.  Skin was closed with 3-0 subcuticular Vicryl stitch.  Sterile dressing was applied.  The patient was taken to the recovery room in stable condition.     [name], M.D.     TFE/MEDQ  D:  03/10/2011  T:  03/11/2011  Job:  [id number]  cc:   Gerlene Burdock A. Alanda Amass, M.D.  Electronically Signed by Tawanna Cooler Lum Stillinger M.D. on 03/17/2011 12:55:35 PM

## 2011-03-18 LAB — BASIC METABOLIC PANEL
BUN: 20 mg/dL (ref 6–23)
CO2: 27 mEq/L (ref 19–32)
Chloride: 98 mEq/L (ref 96–112)
Creatinine, Ser: 0.99 mg/dL (ref 0.4–1.5)
Glucose, Bld: 120 mg/dL — ABNORMAL HIGH (ref 70–99)

## 2011-03-18 LAB — URINALYSIS, ROUTINE W REFLEX MICROSCOPIC
Ketones, ur: NEGATIVE mg/dL
Leukocytes, UA: NEGATIVE
Nitrite: NEGATIVE
Protein, ur: NEGATIVE mg/dL
pH: 6 (ref 5.0–8.0)

## 2011-03-18 LAB — DIFFERENTIAL
Basophils Relative: 0 % (ref 0–1)
Eosinophils Absolute: 0 10*3/uL (ref 0.0–0.7)
Monocytes Relative: 6 % (ref 3–12)
Neutrophils Relative %: 88 % — ABNORMAL HIGH (ref 43–77)

## 2011-03-18 LAB — URINE CULTURE

## 2011-03-18 LAB — CBC
MCHC: 35.4 g/dL (ref 30.0–36.0)
MCV: 89.6 fL (ref 78.0–100.0)
Platelets: 153 10*3/uL (ref 150–400)
RDW: 14.9 % (ref 11.5–15.5)

## 2011-03-20 ENCOUNTER — Other Ambulatory Visit: Payer: Self-pay | Admitting: Vascular Surgery

## 2011-03-20 ENCOUNTER — Ambulatory Visit: Payer: Medicare Other

## 2011-03-20 DIAGNOSIS — I714 Abdominal aortic aneurysm, without rupture: Secondary | ICD-10-CM

## 2011-04-06 ENCOUNTER — Emergency Department (HOSPITAL_COMMUNITY): Payer: Medicare Other

## 2011-04-06 ENCOUNTER — Emergency Department (HOSPITAL_COMMUNITY)
Admission: EM | Admit: 2011-04-06 | Discharge: 2011-04-06 | Disposition: A | Discharge status: Home / Self Care | Payer: Medicare Other | Attending: Emergency Medicine | Admitting: Emergency Medicine

## 2011-04-06 DIAGNOSIS — Y839 Surgical procedure, unspecified as the cause of abnormal reaction of the patient, or of later complication, without mention of misadventure at the time of the procedure: Secondary | ICD-10-CM | POA: Insufficient documentation

## 2011-04-06 DIAGNOSIS — K219 Gastro-esophageal reflux disease without esophagitis: Secondary | ICD-10-CM | POA: Insufficient documentation

## 2011-04-06 DIAGNOSIS — I2510 Atherosclerotic heart disease of native coronary artery without angina pectoris: Secondary | ICD-10-CM | POA: Insufficient documentation

## 2011-04-06 DIAGNOSIS — IMO0002 Reserved for concepts with insufficient information to code with codable children: Secondary | ICD-10-CM | POA: Insufficient documentation

## 2011-04-06 DIAGNOSIS — I739 Peripheral vascular disease, unspecified: Secondary | ICD-10-CM | POA: Insufficient documentation

## 2011-04-06 DIAGNOSIS — I10 Essential (primary) hypertension: Secondary | ICD-10-CM | POA: Insufficient documentation

## 2011-04-06 LAB — URINALYSIS, ROUTINE W REFLEX MICROSCOPIC
Bilirubin Urine: NEGATIVE
Leukocytes, UA: NEGATIVE
Nitrite: NEGATIVE
Specific Gravity, Urine: 1.01 (ref 1.005–1.030)
pH: 6.5 (ref 5.0–8.0)

## 2011-04-06 LAB — POCT CARDIAC MARKERS
CKMB, poc: <1 ng/mL — ABNORMAL LOW (ref 1.0–8.0)
Myoglobin, poc: 75.4 ng/mL (ref 12–200)
Troponin i, poc: <0.05 ng/mL (ref 0.00–0.09)

## 2011-04-06 LAB — CBC
HCT: 34 % — ABNORMAL LOW (ref 39.0–52.0)
Hemoglobin: 11.6 g/dL — ABNORMAL LOW (ref 13.0–17.0)
MCH: 31.4 pg (ref 26.0–34.0)
MCHC: 34.1 g/dL (ref 30.0–36.0)
MCV: 92.1 fL (ref 78.0–100.0)
Platelets: 142 K/uL — ABNORMAL LOW (ref 150–400)
RBC: 3.69 MIL/uL — ABNORMAL LOW (ref 4.22–5.81)
RDW: 14.1 % (ref 11.5–15.5)
WBC: 4.8 K/uL (ref 4.0–10.5)

## 2011-04-06 LAB — COMPREHENSIVE METABOLIC PANEL
AST: 28 U/L (ref 0–37)
Albumin: 3.7 g/dL (ref 3.5–5.2)
Alkaline Phosphatase: 67 U/L (ref 39–117)
Chloride: 93 mEq/L — ABNORMAL LOW (ref 96–112)
GFR calc Af Amer: >60 mL/min (ref >60)
Potassium: 4.6 mEq/L (ref 3.5–5.1)
Sodium: 128 mEq/L — ABNORMAL LOW (ref 135–145)
Total Bilirubin: 0.9 mg/dL (ref 0.3–1.2)

## 2011-04-06 LAB — PROTIME-INR
INR: 0.99 (ref 0.00–1.49)
Prothrombin Time: 13.3 s (ref 11.6–15.2)

## 2011-04-06 LAB — URINE MICROSCOPIC-ADD ON

## 2011-04-06 LAB — LACTIC ACID, PLASMA: Lactic Acid, Venous: 0.8 mmol/L (ref 0.5–2.2)

## 2011-04-06 LAB — DIFFERENTIAL
Eosinophils Absolute: 0.2 10*3/uL (ref 0.0–0.7)
Lymphs Abs: 1.3 10*3/uL (ref 0.7–4.0)
Monocytes Relative: 10 % (ref 3–12)
Neutrophils Relative %: 60 % (ref 43–77)

## 2011-04-06 MED ORDER — IOHEXOL 300 MG/ML  SOLN
100.0000 mL | Freq: Once | INTRAMUSCULAR | Status: AC | PRN
  Administered 2011-04-06: 100 mL via INTRAVENOUS

## 2011-04-08 ENCOUNTER — Encounter (INDEPENDENT_AMBULATORY_CARE_PROVIDER_SITE_OTHER): Payer: Medicare Other

## 2011-04-08 ENCOUNTER — Ambulatory Visit (INDEPENDENT_AMBULATORY_CARE_PROVIDER_SITE_OTHER): Payer: Medicare Other | Admitting: Vascular Surgery

## 2011-04-08 ENCOUNTER — Other Ambulatory Visit: Payer: Medicare Other

## 2011-04-08 DIAGNOSIS — Z48812 Encounter for surgical aftercare following surgery on the circulatory system: Secondary | ICD-10-CM

## 2011-04-08 DIAGNOSIS — I714 Abdominal aortic aneurysm, without rupture, unspecified: Secondary | ICD-10-CM

## 2011-04-08 LAB — URINE CULTURE
Colony Count: NO GROWTH
Culture: NO GROWTH

## 2011-04-09 NOTE — Assessment & Plan Note (Signed)
OFFICE VISIT  GIL, INGWERSEN DOB:  06/29/1918                                       04/08/2011 YQMVH#:[id number]  Patient presents today for follow-up of stent graft repair of his large right common iliac artery aneurysm.  He underwent aortobiiliac stent grafting with a Gore graft and coiling of his right internal iliac artery on 03/10/11.  He has done quite well since discharge.  He did have some inguinal pain and presented to [facility] ER, where he underwent a CT scan within the last week.  I have reviewed his scan.  This does show excellent positioning of his aortoiliac stent with no evidence of endoleak.  He has good flow through both limbs.  He does have some thickening in the spermatic cord by CT scan.  He reports this abdominal pain has continued to improve.  His groin incisions are well healed bilaterally.  He has palpable femoral and palpable posterior tibial pulses bilaterally.  He underwent noninvasive vascular laboratory studies in our office, and this reveals triphasic waveforms bilaterally with normal ankle-arm indices.  I am quite pleased with his early result, as is patient. Plan to see him again in 6 months with repeat CT scan.    [name], M.D. Electronically Signed  TFE/MEDQ  D:  04/08/2011  T:  04/09/2011  Job:  [id number]  cc:   Gerlene Burdock A. Alanda Amass, M.D.

## 2011-04-22 NOTE — Consult Note (Signed)
NEW PATIENT CONSULTATION   DEKKER, VERGA  DOB:  [birth year]                                       12/17/2010  EAVWU#:[id number]   The patient presents today for evaluation of infrarenal abdominal aortic  aneurysm and large right common iliac artery aneurysm.  This has been  followed for a number of years and has progressed in size.  I have  ultrasound studies for review from Dr. Kandis Cocking office dating back  several years.  Maximum diameter in June of 4.1 cm.  He does have  history of prior coronary artery stenting in the right coronary system.  He does have asymptomatic carotid disease which has been documented in  the past with ultrasound as well.  He has no symptoms referable to his  aneurysm and is extremely active at his age of 20.  He is retired from  the [employer].   PAST MEDICAL HISTORY:  Significant for hypertension, elevated  cholesterol.   SOCIAL HISTORY:  He is widowed with 1 child.  He is retired.  He is here  today with his daughter and son-in-law.  He quit smoking 20 years ago.  Does not drink alcohol.   FAMILY HISTORY:  Negative for premature atherosclerotic disease.   REVIEW OF SYSTEMS:  His weight is reported at 163 pounds.  He is 5 feet  10 inches tall.  He denies weight loss or gain.  VASCULAR:  Negative.  CARDIAC:  Negative except for HPI.  GI:  Positive reflux, hiatal hernia, difficulty swallowing,  constipation.  NEUROLOGIC:  Negative.  PULMONARY:  Negative.  HEMATOLOGIC:  Positive for anemia.  URINARY:  Positive for frequent urinary difficulties with urination.  ENT:  Positive change in eyesight and hearing.  MUSCULOSKELETAL:  Positive arthritis and joint pain.  PSYCHIATRIC:  Negative.  SKIN:  Negative.   PHYSICAL EXAMINATION:  General:  Well-developed, well-nourished white  male appearing younger than stated age of 66.  Vital signs:  Blood  pressure of 124/73, pulse 68, respirations 18, in no acute distress.  HEENT:  Normal.  Chest:  Clear bilaterally without rales, rhonchi or  wheezes.  Heart:  Regular rate and rhythm.  I do not appreciate a  murmur.  His radial, femoral, popliteal, and posterior tibial pulses are  2+ bilaterally.  I do not palpate any peripheral aneurysms.  Abdomen:  Soft, nontender.  He does have palpable aortic aneurysm and no  tenderness over the aneurysm.  Musculoskeletal:  Shows no major  deformities or cyanosis.  Neurologic:  No focal weaknesses or  paresthesias.  Skin:  Without ulcers or rashes.   He did undergo a CT scan at [facility].  I have independently  reviewed his films and discussed them with the patient and his family  present.  This actually shows a proximal right iliac artery aneurysm at  the bifurcation with a maximal diameter of 4.6 cm.  This extends down to  the iliac bifurcation on the right, left iliac maximal diameter is 1.7  cm.   I discussed this at length with the patient and his family present.  I  explained that with the large iliac artery aneurysm this certainly puts  him at significantly increased risk for rupture, particularly with the  increase in size over the past 6 months.  I discussed options to include  open resection  of his aneurysm and stent graft repair.  I feel that he  is a potential candidate for stent grafting.  He would require coiling  of his right internal iliac artery and extension of the stent graft into  the right external iliac artery.  I will discuss this with Dr.  Alanda Amass.  He reports that he is to have a cardiac stress test by Dr.  Alanda Amass later in the month.  Assuming this is negative, I would  recommend that we proceed with arteriography and coiling of his right  internal iliac artery followed by stent graft repair.  We will discuss  this further with the patient after I have had an opportunity to discuss  this with Dr. Alanda Amass.     [name], M.D.  Electronically Signed   TFE/MEDQ  D:   12/17/2010  T:  12/17/2010  Job:  [id number]   cc:   Gerlene Burdock A. Alanda Amass, M.D.  Dr. Amalia Greenhouse. _________

## 2011-04-22 NOTE — H&P (Signed)
NAMEREINHART, SAULTERS                  ACCOUNT NO.:  [id number]   MEDICAL RECORD NO.:  [id number]          PATIENT TYPE:  AMB   LOCATION:                                FACILITY:  [facility]   PHYSICIAN:  R. Roetta Sessions, M.D. DATE OF BIRTH:  1918/10/13   DATE OF ADMISSION:  07/15/2007  DATE OF DISCHARGE:  LH                              HISTORY & PHYSICAL   CHIEF COMPLAINT:  Difficulty swallowing.   HISTORY OF PRESENT ILLNESS:  The patient is an 77 year old Caucasian  gentleman well known to our practice with prior history of dysphagia.  He presents stating he is having difficulty swallowing food again.  His  history is significant for abnormal EGD in 2006.  Initial EGD on June 25, 2005 revealed a furrowed area of esophageal mucosa, a solitary  lesion in the mid esophagus which still could be nothing more than  erosive reflux esophagitis.  However, it looked  usual.  There was also  a biopsy.  Biopsy came back low-grade squamous dysplasia.  He also had a  prominent Schatzki ring with some eroded overlying mucosa straddling the  EG junction.  He had a large hiatal hernia.  He had a followup EGD in  October 2006 which revealed an S-shaped distal esophageal erosion  (middle of the distal third) with an unusual appearance which was  biopsied multiple times.  Biopsy came back negative with no dysplasia  identified.  Because of previous biopsy results, he was sent for an EUS  for further evaluation of this lesion.  EUS revealed benign-appearing  wall layers of esophagus and benign mediastinal adenopathy.  Biopsies  revealed atypical squamous cells of undetermined significance favoring  reactive changes due to inflammation.   The patient states he has had dysphagia which has progressively worsened  over the last month or so.  He had difficulty swallowing his food.  According to his daughter, he is chewing tobacco again.  He denies any  abdominal pain.  He does have postprandial heartburn.  He is  taking his  Prevacid rarely.  Bowel movements are regular.  No melena or rectal  bleeding.  He was recently hospitalized for a hyponatremia, according to  the daughter it was due to vomiting.   CURRENT MEDICATIONS:  1. Toprol 20 mg daily.  2. ICaps b.i.d.  3. Plavix 75 mg daily.  4. Prevacid 30 mg p.r.n.  5. Aspirin 81 mg daily.  6. Uroxatral once daily.  7. Altace 10 mg daily.  8. Lipitor 20 mg daily.  9. Tylenol p.r.n.  10.Zyrtec p.r.n.  11.Darvocet-N 100 p.r.n.  12.Finasteride daily.   ALLERGIES:  No known drug allergies.   PAST MEDICAL HISTORY:  1. Hypertension.  2. Hypercholesterolemia.  3. Coronary artery disease status post stent and angioplasty.  4. Remote TIA.  5. Arthritis.  6. Macular degeneration.  7. Multiple skin cancers.  8. Gastroesophageal reflux disease.  9. Benign prostatic hypertrophy.  10.Previous tonsillectomy.  11.He reports an unremarkable colonoscopy around 2005.   FAMILY HISTORY:  Mother deceased age 43, possible CHF.  Father deceased  age  29 due to pneumonia.  Mother also had renal failure.   SOCIAL HISTORY:  He is married and has one daughter.  He is retired from  [employer].  He quit smoking 10-12  years ago.  He intermittently chews  tobacco; the last time was about a month ago.  No alcohol use.   REVIEW OF SYSTEMS:  See HPI for GI.  CONSTITUTIONAL:  With no weight  loss.  CARDIOPULMONARY: No chest pain or shortness of breath.   PHYSICAL EXAMINATION:  VITAL SIGNS:  Weight 153.  Height 5 feet 10  inches.  Temperature 98.4.  Blood pressure 120/70.  Pulse 60.  GENERAL:  Pleasant elderly, somewhat hard of hearing Caucasian male in  no acute distress.  SKIN:  Warm and dry.  No jaundice.  HEENT:  Sclerae nonicteric.  Oropharynx:  Mucosa is moist and pink.  No  lesions, erythema, or exudate.  No lymphadenopathy.  CHEST:  Lungs are clear to auscultation.  CARDIAC:  Exam reveals regular rate and rhythm; no murmurs, rubs, or  gallops.  ABDOMEN:   Positive bowel sounds; soft, nontender,  nondistended; no organomegaly or masses; no rebound tenderness or  guarding; no abdominal bruits or hernias.  EXTREMITIES:  No edema.   IMPRESSION:  Mr. Lech is an 77 year old gentleman with recurrent  dysphagia.  He has had abnormal esophagogastroduodenoscopy in 2006 as  outlined above.  Final consensus of the endoscopic ultrasonography  revealed no esophageal tumor or cancer.  Given recurrent dysphagia, he  needs further evaluation not only to exclude carcinoma but for  therapeutic measures.   PLAN:  1. EGD in the near future with Dr. Jena Gauss.  2. We will hold his Plavix and aspirin for 4 days prior to procedure.  3. I encouraged him to take Prevacid 30 mg daily.      Tana Coast, P.AJonathon Bellows, M.D.  Electronically Signed    LL/MEDQ  D:  07/15/2007  T:  07/16/2007  Job:  [id number]

## 2011-04-22 NOTE — H&P (Signed)
NAMEERIQ, HUFFORD NO.:  [id number]   MEDICAL RECORD NO.:  [id number]          PATIENT TYPE:  INP   LOCATION:  6727                         FACILITY:  [facility]   PHYSICIAN:  Fleet Contras, M.D.    DATE OF BIRTH:  Apr 01, 1918   DATE OF ADMISSION:  06/29/2007  DATE OF DISCHARGE:                              HISTORY & PHYSICAL   PRESENTING COMPLAINTS:  Weakness and vomiting.   HISTORY OF PRESENT ILLNESS:  Mr. Kallal is an 77 year old Caucasian  gentleman with multiple medical problems including coronary artery  disease, hypertension, dyslipidemia and benign prostatic hypertrophy.  He was admitted via the emergency room at [facility] after he  presented with a two-day history of progressive weakness and fatigue.  He stated that over the weekend he had some soreness in his throat and  he was seen at an urgent care center where he was treated for a possible  sinus infection with amoxicillin.  Since he started taking the  medication, he started having nausea and vomiting and upset stomach.  He  could not keep his food or drinks down.  He progressively became more  weak and fatigued and, therefore, came to the emergency room.  He denied  having any chest pain, shortness of breath, orthopnea, PND or  palpitations.  He had some lightheadedness.  He did not have any  syncope, seizures or falls.  He had no weakness of his extremities.  He  had been passing urine okay and had normal bowel movements without any  blood.  The vomitus did not have any blood in it.  He did have some  discomfort in his throat and some nasal congestion and postnasal drip.  He had no fevers an no chills.  No headaches.  He also had a slight  cough with no sputum production.  He had no shortness of breath, chest  tightness or dyspnea on exertion.   In the emergency room, he was noted to be dehydrated and lethargic.  He  is vital signs essentially were stable.  Initial laboratory data showed  sodium of 111 and chloride of 82.  He was, therefore, rehydrated in the  emergency room and admitted for further therapy.   PAST MEDICAL HISTORY:  Significant for:  1. Coronary artery disease status post angioplasty.  2. 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  3. Hypertension.  4. Dyslipidemia.  5. Gastroesophageal reflux disease and hiatal hernia.  6. Benign prostatic hypertrophy.  7. Macular degeneration of the eyes.   MEDICATION HISTORY:  1. Toprol XL 25 mg daily.  2. Aspirin 325 mg daily.  3. Prevacid 30 mg daily.  4. Uroxatral 10 mg daily.  5. Altace 5 mg daily.  6. Lipitor 20 mg daily.  7. Darvocet-N 100 one p.o. every 12 p.r.n.  8. Proscar 5 mg daily.  9. Multivitamin one daily.   ALLERGIES:  He has no known drug allergies.   FAMILY HISTORY AND SOCIAL HISTORY:  He is married.  He lives with his  elderly  wife, who is actually chronically ill, and he more or less cares  for her.  He has one daughter, who is living nearby and checks regularly  on both of them.  He quit smoking about 15 years ago.  He intermittently  chews tobacco.  He denies any use of alcohol or illicit drugs.   REVIEW OF SYSTEMS:  Essentially as above.   PHYSICAL EXAMINATION:  GENERAL APPEARANCE:  He is lying comfortably on  the hospital bed not in acute respiratory or painful distress.  VITAL SIGNS:  His vital signs shows a blood pressure of 164/77, heart  rate of 67 and regular, respiratory rate of 24, temperature 98 and O2  sats on room air is 99%.  HEENT:  He is not pale.  He is not icteric.  He is not cyanosed.  He has  dry oral tongue and mucosa.  NECK:  Supple with no elevated JVD.  No cervical lymphadenopathy.  No  carotid bruit.  CHEST:  Shows good air entry bilaterally with no rales, rhonchi or  wheezes.  ABDOMEN:  Soft and nontender.  No masses.  Bowel sounds are present.  There are no hernias or bruits.  EXTREMITIES:  Shows no edema and no  calf tenderness or swelling.  CNS: He is alert and oriented x3 with no focal neurological deficits.   INITIAL LABORATORY DATA:  Sodium 111, chloride 80, potassium 4.1,  bicarbonate of 24, BUN is 12, creatinine 0.75 and glucose of 110.  His  hemoglobin on the i-STAT is 12.6 and hematocrit is 37.  Chest x-ray  shows COPD changes with no acute infiltrates.  EKG shows normal sinus  rhythm with sinus bradycardia at 53 beats per minute.  There are no  acute ST-T wave changes.  Urinalysis is negative for leukocytes or  nitrites.   ASSESSMENT:  Mr. Brandonlee Navis knees is an 77 year old Caucasian gentleman  with multiple medical problems who presented to the emergency room with  weakness and fatigue after a couple of days of nausea and vomiting and  upset stomach following the use of oral antibiotics for a possible upper  respiratory tract infection.  He is slightly dehydrated and he will be  admitted for correction of his electrolyte abnormalities and close  monitoring.   ADMISSION DIAGNOSES:  1. Hyponatremia.  2. Hypochloremia.  3. Dehydration.  4. Upper respiratory tract infection, likely viral.  5. History of degenerative joint disease of the lumbar spine, cervical      spine, hips and knees.  6. History of coronary artery disease, currently stable.  7. History of hypertension, poorly controlled.   PLAN OF CARE:  He will be admitted to a telemetry bed.  He will be on a  2 gm sodium diet.  Initially, clear liquids and then advance as  tolerated.  Vital signs every 4 hours.  His input and output will be  monitored closely.  He will be started on IV Protonix 40 mg daily, IV  Zofran 4 mg every 4 hours p.r.n., IV fluids with normal saline bolus 150  mL STAT followed by 75 mL an hour.  His BMET will be monitored every 6  hours until stable.  An MRI scan of  the lumbar spine will be performed to evaluate his degenerative joint  disease of the lumbar spine as he has been having severe pain as  an  outpatient.  No further antibiotics are indicated unless he runs a fever  or develops leukocytosis.  This plan of care  has been discussed with him  and his questions were answered.      Fleet Contras, M.D.  Electronically Signed     EA/MEDQ  D:  06/30/2007  T:  06/30/2007  Job:  [id number]

## 2011-04-22 NOTE — Op Note (Signed)
NAMEKAREM, TOMASO                  ACCOUNT NO.:  [id number]   MEDICAL RECORD NO.:  [id number]          PATIENT TYPE:  AMB   LOCATION:  DAY                           FACILITY:  [facility]   PHYSICIAN:  R. Roetta Sessions, M.D. DATE OF BIRTH:  10-19-1918   DATE OF PROCEDURE:  07/26/2007  DATE OF DISCHARGE:                               OPERATIVE REPORT   PROCEDURE:  EGD with Elease Hashimoto dilation.   INDICATIONS FOR PROCEDURE:  An 77 year old gentleman with recurrent  esophageal dysphagia.  He has a history of a benign lesion in his  esophagus for which he ultimately underwent serial biopsies and  endoscopic ultrasound without ever having a significant diagnosis being  made. He has gastroesophageal reflux disease and takes Prevacid 30 mg  orally daily only intermittently. EGD is now being done with plans for  esophageal dilation. He has a history of a Schatzki's ring dilated  previously. This  approach has been discussed with the patient at  length. The potential risks, benefits and alternatives have been  reviewed, questions answered, he is agreeable.  Please see documentation  in the medical record.   PROCEDURE NOTE:  O2 saturation, blood pressure, pulse, and respirations  were monitored throughout the entire procedure.  Conscious sedation  Versed 4 mg IV, Demerol 100 mg IV in divided doses.   INSTRUMENT:  Pentax video chip system.   FINDINGS:  Examination of the tubular esophagus revealed a rather tight  Schatzki's ring that initially would not admit the scope but with some  gentle pressure the EG junction was traversed.  The stomach was entered,  the ring was partially traumatically dilated. The esophageal mucosa  otherwise appeared normal.   STOMACH:  The gastric cavity was empty and insufflated well with air. A  thorough examination of the gastric mucosa including a retroflexed view  of the proximal stomach and esophagogastric junction demonstrated only a  small hiatal hernia.  The  pylorus was patent and easily traversed.  Examination of the bulb and second portion revealed no abnormalities.   THERAPEUTIC/DIAGNOSTIC MANEUVERS PERFORMED:  The scope was withdrawn.  A  56-French Maloney dilator was passed to full insertion. Look back  revealed the ring had been ruptured without apparent complication.  The  patient tolerated the procedure well and was reacted in endoscopy.   IMPRESSION:  Critical Schatzki's ring, otherwise normal esophagus status  post dilation disruption as described above.  Small hiatal hernia  otherwise normal stomach, D1, S2.   RECOMMENDATIONS:  1. Gastroesophageal reflux disease literature provided to Mr. Schwinn.      He is to take Prevacid 30 mg orally every      day.  2. He is to call if he has any future difficulties with swallowing.  3. He is to resume his Plavix and aspirin tomorrow.      Jonathon Bellows, M.D.  Electronically Signed     RMR/MEDQ  D:  07/26/2007  T:  07/26/2007  Job:  [id number]   cc:   Fleet Contras, M.D.  Fax: [phone]

## 2011-04-25 NOTE — Consult Note (Signed)
. [facility]  Patient:    [name], [name] Visit Number: [id number] MRN: [id number]          Service Type: MED Location: [phone] Attending Physician:  Phifer, Trinna Post Dictated by:   Kelli Hope, M.D. Proc. Date: 12/02/01 Admit Date:  11/30/2001                            Consultation Report  DATE OF BIRTH:  10-Jul-1918  REQUESTING PHYSICIAN:  Alvester Morin, M.D.  REASON FOR EVALUATION:  Altered mental status.  HISTORY OF PRESENT ILLNESS:  This is the initial inpatient consultation evaluation of this 77 year old man with little past medical history who on the morning of November 24 developed an acute alteration in his mental status. The patient reports that he got up that morning feeling fine, had a cup of coffee, and went to [employer] to get a biscuit.  He subsequently remembers feeling "bad" later that morning.  He notes he had a headache, but just cannot describe his feelings any further, although it sounds like he had some malaise.  His wife notes that as soon as he came home from [employer] he came to the bed to lie down claiming that he did not feel good and that he had a headache.  He also mentioned that he might need to go to the emergency room. About a half hour later after his daughter had arrived he was noted to be acting confused, trying to drink directly out of the coffee pot, and seeming not to understand how to take a drink from a glass placed in his hand.  He was subsequently taken to the emergency room for evaluation where on admission he was afebrile and was found to have a serum sodium of 123.  CT of the head was negative at that time.  Patient was admitted and subsequently became febrile and his confusion persisted.  He underwent a lumbar puncture yesterday which in tube one revealed 21 white cells and 920 red blood cells with 38% polys and in tube four revealed 24 white blood cells, 25 red blood cells with  44% polys. He was subsequently treated with ampicillin, vancomycin, and Rocephin.  Serum sodium remained low yesterday at 122, but this morning is better at 131.  The patient defervesced yesterday evening and is much better by all accounts today.  He says that he has some memory of yesterday, but not really of anything prior to that in his hospitalization.  His only complaint at present is that his chest feels a little bit sore.  PAST MEDICAL HISTORY:  He had a coronary angioplasty by Dr. Alanda Amass about 10 years ago.  Has had no further problems with that.  He also had BPH.  FAMILY HISTORY:  Noncontributory.  SOCIAL HISTORY:  He is married and lives with his wife.  He is normally independent in his activities of daily living.  He is a 50 pack year smoking history.  Quit smoking 10 years ago.  Denies any history of alcohol use.  MEDICATIONS:  Prior to admission he was taking aspirin, Toprol, and Hytrin. Presently, he is also on Protonix, ampicillin, Rocephin, and vancomycin.  PHYSICAL EXAMINATION  VITAL SIGNS:  Temperature 98.5, blood pressure 130/60, pulse 65, respirations 14.  GENERAL/MENTAL STATUS:  He is awake, alert, and in no evident distress.  He is completely oriented to place except that he misses the exact date.  He identifies the President as Danae [place], the immediate past President as Danae [place], and the President prior to that as [place]  He has a poor short-term memory recalling one of three objects after distraction and seems to have some difficulty with concentration, although could perform arithmetic adequately. He has no defects to confrontational naming.  Mood is euthymic and affect appropriate.  NECK:  Supple and painless.  Full range of motion.  HEART:  Regular rate and rhythm without murmurs.  NEUROLOGIC:  Cranial nerves:  Pupils are equal and briskly reactive. Extraocular movements are normal without nystagmus.  Visual fields full to confrontation.  Face, tongue,  and palate move normally and symmetrically.  He is a little hard of hearing.  Motor examination:  Normal bulk and tone. Normal strength in all tested extremity muscles.  Sensation is intact to light touch and vibration in all extremities.  Reflexes are 2+ throughout.  Toes are downgoing.  Finger-to-nose is performed well.  He has a little difficulty understanding heel-to-shin, but can perform the maneuver.  Gait examination is deferred.  LABORATORIES:  CT and LP as above.  MRI of the brain with MRA Is personally reviewed today with Dr. Constance Goltz.  It demonstrates no abnormalities in the brain. There is some mild sinusitis and right mastoiditis, but no clear parameningeal focus.  Serum sodium is improved as above.  IMPRESSION: 1. Altered mental status with headache.  On the basis of his studies I would    favor the diagnosis of a partially treated bacterial meningitis due to the    presence of PMNs in the CSF and improvement on antibiotics.  Viral    meningitis is possible, but explains neither the PMNs nor his alteration in    mental status as well, although the alteration in mental status could    possibly be due to hyponatremia.  I doubt that he has a subarachnoid    hemorrhage here basically on the basis of normal initial CT scan, absence    of xanthochromia in the CSF, and negative MRA.  This, however, cannot be    absolutely excluded without cerebral angiogram. 2. Hyponatremia.  I am not sure how or if this is related to his CNS event.    It is possible, though unlikely, that this also compromised his mental    status.  RECOMMENDATIONS: 1. Would encourage ambulation. 2. ______ infectious disease consult.  I think that he is probably committed    to a course of empiric antibiotics but some opinion should be given on how    long this should last. 3. I do not favor angiography at this time, although if he has any recurrent    symptoms this should probably be undertaken.  Thank you  for the consult. Dictated by:   Kelli Hope, M.D. Attending Physician:  Phifer, Trinna Post DD:  12/02/01 TD:  12/03/01  Job: [id number]

## 2011-04-25 NOTE — Op Note (Signed)
[name], [name]                  ACCOUNT NO.:  [id number]   MEDICAL RECORD NO.:  [id number]          PATIENT TYPE:  AMB   LOCATION:  DAY                           FACILITY:  [facility]   PHYSICIAN:  R. Roetta Sessions, M.D. DATE OF BIRTH:  Oct 16, 1918   DATE OF PROCEDURE:  09/08/2005  DATE OF DISCHARGE:                                 OPERATIVE REPORT   PROCEDURE:  Esophagogastroduodenoscopy with biopsy.   INDICATIONS FOR PROCEDURE:  The patient is an 77 year old gentleman with  history of esophageal dysphagia to solids, long standing oral tobacco  exposure who was seen on June 25, 2005 by me at which time he underwent EGD.  EGD revealed ____________ esophageal mucosa mid of distal third of the  esophagus. Biopsies revealed low-grade squamous dysplasia. Schatzki's ring  was dilated. I asked him to bump his Prevacid up to 30 mg orally b.i.d. He  returns now for repeat biopsy. He is having no dysphagia or other upper GI  symptoms. Potential risks, benefits, and alternatives have been reviewed and  questions answered. He is agreeable. Please see documentation in the medical  record.   PROCEDURE NOTE:  O2 saturation, blood pressure, pulse, and respirations were  monitored throughout the entire procedure. Conscious sedation with Versed 2  mg IV and Demerol 50 mg IV. Cetacaine spray for topical oropharyngeal  anesthesia.   INSTRUMENT:  Olympus video chip system.   FINDINGS:  Examination of tubular esophagus again revealed S-shaped somewhat  wide erosion in the middle of the distal third of the esophagus. There was  at least 3 to 4 cm of intervening normal appearing esophageal mucosa from  the distal aspect of this eroded area to the EG junction. This did not  clearly appear to be a neoplastic lesion although it did not at all appear  to be a typical erosion seen with acid reflux. He had a very noncritical  Schatzki's ring. EG junction was easily traversed.   Stomach:  Gastric cavity was  empty and insufflated well with air. Thorough  examination of gastric mucosa including retroflexed view of the proximal  stomach and esophagogastric junction demonstrated only a hiatal hernia.  Mucosa appeared normal otherwise. Pylorus patent and easily traversed.  Examination of bulb and second portion revealed no abnormalities.   THERAPEUTIC/DIAGNOSTIC MANEUVERS:  The area in question of the esophagus was  biopsied multiple times for histology. The patient tolerated the procedure  well and was reactive to endoscopy.   IMPRESSION:  1.  S-shaped distal esophageal erosion (middle of distal third), unusual      appearance, biopsied multiple times. Noncritical Schatzki's ring not      manipulated.  2.  Hiatal hernia. Otherwise normal gastric mucosa. Normal D1 and D2.   [name] is devoid of any upper GI tract symptoms. Would agree dysphagia  previously concerning. We will see what the pathologist has to say about the  latest tissues submitted for histology.   I suspect this could be a pill-induced injury, but again it had an unusual  appearance for that entity as well. Will make further recommendations  in the  very near future.      Jonathon Bellows, M.D.  Electronically Signed     RMR/MEDQ  D:  09/08/2005  T:  09/08/2005  Job:  [id number]   cc:   Gerlene Burdock A. Alanda Amass, M.D.  Fax: [phone]   Fleet Contras, M.D.  Fax: [phone]

## 2011-04-25 NOTE — Op Note (Signed)
[name], [name]                  ACCOUNT NO.:  [id number]   MEDICAL RECORD NO.:  [id number]          PATIENT TYPE:  AMB   LOCATION:  DAY                           FACILITY:  [facility]   PHYSICIAN:  R. Roetta Sessions, M.D. DATE OF BIRTH:  [birth year]   DATE OF PROCEDURE:  06/25/2005  DATE OF DISCHARGE:                                 OPERATIVE REPORT   PROCEDURE:  Esophagogastroduodenoscopy with Elease Hashimoto dilation followed by  biopsy.   INDICATIONS FOR PROCEDURE:  The patient is an 77 year old gentleman with  several-month history of esophageal dysphagia to solids.  He does use  chewing tobacco.  He takes Prevacid, but only on a p.r.n. basis.  He really  denies any significant reflux symptoms.  EGD is now being done.  This  approach has been discussed with the patient at length.  The potential  risks, benefits and alternatives have been reviewed, and questions answered.  He was agreeable to proceed.  Please see documentation in medical record.   PROCEDURE NOTE:  O2 saturation, blood pressure and pulse and respirations  were monitored throughout the entire procedure.   CONSCIOUS SEDATION:  Versed 3 mg IV, Demerol 50 mg IV in divided doses.   INSTRUMENT:  Olympus video chip system.   FINDINGS:  Examination of the tubular esophagus revealed a furrowed, wide, S-  shaped area of eroded mucosa up in the mid body of the esophagus.  Please  see photos.  Between this area and the esophagogastric junction, the  esophageal mucosa looked good.  At the EG junction was a prominent  Schatzki's ring.  No evidence of Barrett's esophagus.  There was a little  eroded mucosa overlying the Schatzki's ring.  The EG junction was easily  traversed with a scope.   Stomach:  The gastric cavity was empty and insufflated well with air.  A  thorough examination of the gastric mucosa including a retroflexed view of  the proximal stomach and esophagogastric junction demonstrated a large  hiatal hernia, otherwise  gastric mucosa appeared normal.  Pylorus patent and  easily traversed. Examination of the bulb and second portion revealed no  abnormalities.   Therapeutic/diagnostic maneuvers performed:  1. A 56 French Maloney dilator  was passed fully with ease.  A look back revealed that the ring had been  ruptured without apparent complications.  The area of furrowed mucosa at the  mid esophagus was biopsied for histologic study.  The patient tolerated the  procedure well, was reacted endoscopy.   IMPRESSION:  1.  Furrowed area of esophageal mucosa, solitary lesion mid esophagus, may      represent nothing more than erosive reflux esophagitis; however, it was      notably higher up than we usually see with normal intervening mucosa all      the way down to the esophagogastric junction, biopsied.  2.  Prominent Schatzki's ring with some eroded overlying mucosa straddling      the esophagogastric junction.  Otherwise normal-appearing esophagus,      status post Maloney dilation as described above.  3.  Large hiatal hernia,  otherwise normal stomach, normal D1, D2.   RECOMMENDATIONS:  1.  The patient was urged to stop tobacco use entirely.  2.  Begin Prevacid 30 mg, one capsule each and every morning.  3.  Antireflux literature provided to [name].  4.  Followup on pathology.  5.  Further recommendations to follow.       RMR/MEDQ  D:  06/25/2005  T:  06/25/2005  Job:  [id number]   cc:   Gerlene Burdock A. Alanda Amass, M.D.  [phone] [address], [address]  [place]  [place] [address]  Fax: [phone]

## 2011-04-25 NOTE — Consult Note (Signed)
NAMEKIRSTEN, MCKONE                  ACCOUNT NO.:  [id number]   MEDICAL RECORD NO.:  [id number]          PATIENT TYPE:  AMB   LOCATION:  DAY                           FACILITY:  [facility]   PHYSICIAN:  R. Roetta Sessions, M.D. DATE OF BIRTH:  1918-02-02   DATE OF CONSULTATION:  06/05/2005  DATE OF DISCHARGE:                                   CONSULTATION   CHIEF COMPLAINT:  Problem swallowing.   HISTORY OF PRESENT ILLNESS:  The patient is an 77 year old Caucasian  gentleman who presents today for further evaluation of dysphagia at the  request of Dr. Alanda Amass.  Over the past several months he has had  difficulty swallowing solid foods and occasionally liquids.  He feels the  food gets stuck in his lower esophagus and sometimes he is able to wash it  down with hot coffee or other fluids.  If he coughs the food might come back  up.  He denies any choking or coughing related to these symptoms.  He has  only occasional heartburn symptoms and takes Prevacid on a p.r.n. basis.  Denies any odynophagia, nausea or vomiting, abdominal pain, melena or rectal  bleeding, diarrhea.  Occasionally he has some constipation, but never goes  more than a couple of days without a bowel movement.  He was hospitalized  from May 29, 2005 to June 01, 2005, at [facility].  He had  unstable angina and underwent cardiac catheterization and was found to have  three-vessel coronary artery disease, the culprit lesion being a high-grade  mid right coronary artery stenosis which was treated with initially an  angioplasty and then stenting.  He was started on Plavix and aspirin.  He  denies any further chest pain.  No shortness of breath.   CURRENT MEDICATIONS:  1.  Toprol 20 mg daily.  2.  E-Caps daily.  3.  Plavix 75 mg daily.  4.  Prevacid 30 mg p.r.n.  5.  Aspirin 325 mg daily.  6.  UroXatral 1 daily.  7.  Altace 5 mg b.i.d.  8.  Lipitor 20 mg daily.  9.  Tylenol p.r.n.  10. Zyrtec p.r.n.   ALLERGIES:   NO KNOWN DRUG ALLERGIES.   PAST MEDICAL HISTORY:  1.  Coronary artery disease status post stenting as outlined above.  He has      had angioplasty at least twice in the remote past as well.  He has never      had an MI.  2.  History of arthritis.  3.  Macular degeneration.  4.  Multiple skin cancers.  5.  Hypertension.  6.  Gastroesophageal reflux disease.  7.  Hypercholesterolemia.  8.  Benign prostatic hypertrophy.  9.  Tonsillectomy.  10. He had a colonoscopy about a year and a half ago.  Reports it to be      unremarkable.   FAMILY HISTORY:  Mother had renal failure died at age 86, father died of  pneumonia at age 56.   SOCIAL HISTORY:  He is married and one daughter, he is retired from  [employer].  He quit smoking 10-12 years ago.  He intermittently chews  tobacco.   REVIEW OF SYSTEMS:  See HPI for GI and Cardiopulmonary.  Constitutional  denies any weight loss.   PHYSICAL EXAMINATION:  Weight 158 pounds, height 5 foot 10 inches,  temperature 98.3, blood pressure 132/58, pulse 64.  GENERAL:  Pleasant, well-nourished, well-developed Caucasian male in no  acute distress.  He appears younger than his stated age.  SKIN:  Warm and dry and no jaundice.  HEENT:  Conjunctivae are pink, sclerae are nonicteric, oropharyngeal mucosa  moist and pink.  No lesions, erythema, or exudate.  No lymphadenopathy,  thyromegaly.  CHEST:  Lungs are clear to auscultation.  CARDIAC:  Exam reveals a regular rate and rhythm, normal S1, S2, no murmurs,  rubs or gallops.  ABDOMEN:  Positive bowel sounds, soft, nontender, nondistended, no  organomegaly or masses, no rebound tenderness or guarding.  No abdominal  bruits or hernias.  EXTREMITIES:  No edema.   IMPRESSION:  Mr. Imran is an 77 year old gentleman with several months  history of dysphagia primarily to solid foods and sometimes liquids.  He has  a long history of intermittent use of chewing tobacco.  He needs to undergo  an upper  endoscopy to rule out esophageal cancer as well as to evaluate for  esophageal ring, web or stricture.  I discussed risks, alternatives, and  benefits of the procedure with the patient and he is agreeable to proceed.  I have also discussed this case with Dr. Jena Gauss.  The patient will remain on  his Plavix and aspirin as recommended by his Cardiologist.   PLAN:  1.  Esophagogastroduodenoscopy with esophageal dilatation in approximately 2-      3 weeks.  2.  Prevacid 30 mg daily, #20 samples given.   I would like to thank Dr. Susa Griffins for allowing [place] to take part in  the care of this patient.       LL/MEDQ  D:  06/05/2005  T:  06/05/2005  Job:  [id number]   cc:   Gerlene Burdock A. Alanda Amass, M.D.  [phone] [address]  [place]  [place] [address]  Fax: [phone]   R. Roetta Sessions, M.D.  [address]  [place]  [place] [address]

## 2011-04-25 NOTE — Discharge Summary (Signed)
NAMEWILLIARD, KELLER NO.:  [id number]   MEDICAL RECORD NO.:  [id number]          PATIENT TYPE:  INP   LOCATION:  6727                         FACILITY:  [facility]   PHYSICIAN:  Fleet Contras, M.D.    DATE OF BIRTH:  21-Nov-1918   DATE OF ADMISSION:  06/29/2007  DATE OF DISCHARGE:  07/02/2007                               DISCHARGE SUMMARY   HISTORY OF PRESENTING ILLNESS:  Mr. Goodley is an 77 year old Caucasian  gentleman with multiple medical problems including coronary artery  disease status post angioplasty, hypertension, dyslipidemia,  gastroesophageal reflux disease, benign prostatic hypertrophy, macular  degeneration to the retina, who presented to the emergency room at [facility] with a 2-day history of progressive weakness and fatigue.  He said that he has swollen throat at the [facility] care [facility],  possiblesinus infection and after that he developed nausea, upset  stomach and vomiting.  He could not keep food or drinks down.  He  progressively became more weak and fatigued he came to the emergency  room.  No lightheadedness, no falls, seizures, or syncope.  He had no  weakness of his extremities, some slurring of speech.  He vomitus did  not contain any blood.  He has been passing urine okay.  He had no  diarrhea or constipation.  In the emergency room, he was noted to be  dehydrated, lethargic.  Vital signs were stable, but his laboratory data  showed a sodium of 111 and chloride of 82.   HOSPITAL COURSE:  On admission, his blood pressure was 164/77, heart  rate was 67, respiratory rate of 24, temperature 98, O2 sats of 99%.  He  was not pale, was not icteric, was not cyanosed.  He was mildly  dehydrated.  His abdomen was benign.   LABORATORY DATA:  Showed sodium of 111, chloride 80, potassium 4.0,  bicarbonate 24, BUN 12, creatinine 0.75, and glucose of 110.  Hemoglobin  was 12.6, hematocrit 37.  Chest x-ray showed COPD changes without acute  infiltrates.  EKG showed sinus rhythm, bradycardia of 53 beats per  minute.  There were no acute ST-T-wave changes.  Urinalysis was  negative.  He was started on IV fluids, normal saline, IV Protonix, IV  Zofran, as well as a 2-g sodium diet.  He was able to tolerate clear  liquids, and this was advanced as tolerated.  An MRI scan of the lumbar  spine was performed, and this showed significant degenerative joint  disease, multiple levels of the lumbar spine.  Sodium was monitored  closely and improved on a daily basis up to  July 02, 2007, he was  feeling much better.  He was eating foods without trouble swallowing.  No further nausea or vomiting.  Blood pressure was 150/74, heart rate  was 68, temperature 98.1, respiratory rate of 18.  His abdomen was  benign.  His laboratory showed a sodium of 127, chloride of 92.  He was,  therefore, considered stable for discharge home.   DISCHARGE DIAGNOSES:  1. Hyponatremia.  2. Dehydration.  3. Hypertension.  4.  Degenerative joint disease of the lumbar spine.   He is to follow up with me in 1 week in our office, as well as to follow  up with his orthopedist, Dr. Jena Gauss, as an outpatient for his lumbar  spine disease.   MEDICATIONS ON DISCHARGE:  1. Toprol XL 25 mg daily.  2. Aspirin 325 mg daily.  3. Prevacid 30 mg daily.  4. Uroxatral 10 mg daily.  5. Altace 10 mg daily.  6. Lipitor 10 mg daily.  7. Darvocet-N 100 one p.o. every 12 p.r.n.  8. Proscar 5 mg daily.  9. Multivitamin once a day.   DISPOSITION:  Home   CONDITION ON DISCHARGE:  Stable.      Fleet Contras, M.D.  Electronically Signed     EA/MEDQ  D:  08/12/2007  T:  08/12/2007  Job:  [id number]

## 2011-04-25 NOTE — Discharge Summary (Signed)
NAMEQUINTIN, HJORT NO.:  [id number]   MEDICAL RECORD NO.:  [id number]          PATIENT TYPE:  INP   LOCATION:  2003                         FACILITY:  [facility]   PHYSICIAN:  Richard A. Alanda Amass, M.D.DATE OF BIRTH:  1918-01-29   DATE OF ADMISSION:  05/29/2005  DATE OF DISCHARGE:  06/01/2005                                 DISCHARGE SUMMARY   DISCHARGE DIAGNOSES:  1.  Unstable angina pectoris.  2.  Coronary artery disease, status post intervention during this admission.  3.  Hypertension.  4.  Hyperlipidemia.  5.  Remote cerebrovascular accident.  6.  Dysphagia. Outpatient evaluation with Dr. Karilyn Cota in [place] is      pending.   HISTORY OF PRESENT ILLNESS AND HOSPITAL COURSE:  This is an 77 year old  Caucasian gentleman who developed an episode of chest pressure while walking  that started around 6:30 or 7:00 on the morning of his presentation to the  emergency room at [facility]. He was on his way to his house when  he developed squeezing pressure in the chest like a sensation of something  caught in his chest and thought that he had a tightness and pressure that  was off and on. He still continued his walk and symptoms were intermittent,  but when he came back home he was still not feeling all right. There were  some problems with intermittent shortness of breath. He decided that he  would be better off he came to the [facility] emergency room to be checked.   After visiting [facility] emergency room and assessment there he was  transferred for [facility] for full cardiac evaluation.   We admitted him to a telemetry unit, cycled his enzymes, and three sets of  cardiac enzymes were normal. He was scheduled for cardiac catheterization on  the next day of his admission.   HOSPITAL PROCEDURES:  Coronary angiography performed by Dr. Alanda Amass on  May 30, 2005, which showed three-vessel coronary artery disease. The  culprit lesion was in  the mid RCA. This stenosis was 85%. Dr. Alanda Amass  implanted a [place] stent after angioplasty with reduction of the lesion from  85% to 0%. He was also given __________ for 18 hours. The patient tolerated  the procedure well and was transferred to the unit in stable condition.   HOSPITAL COMPLICATIONS:  None.   HOSPITAL LABORATORIES:  His CBC showed hemoglobin 12.12, hematocrit 35.3,  white blood cell count 5.4, platelet count 175,000. Sodium 135, potassium  4.2, chloride 99, CO2 30, BUN 14, creatinine 1.1, glucose 105. Cholesterol  was 146, triglycerides 59, LDL 93, HDL 41.   On the morning of his discharge he was assessed by Dr. Jenne Campus. His groin  site was stable without any complications of bleeding, oozing, or  ecchymosis. He had stable vital signs. No complaints of shortness of breath  or chest pain. The patient was discharged home in stable condition.   DISCHARGE INSTRUCTIONS:  No driving. No lifting greater than 5 pounds. No  strenuous activities for three days post catheterization. He was advised to  continue with a low-fat, low-cholesterol diet. The office will contact the  patient to schedule him for an appointment with Dr. Alanda Amass to be seen in  two to three weeks.   DISCHARGE MEDICATIONS:  1.  Lipitor 20 mg daily.  2.  Aspirin 825 mg daily.  3.  Protonix 40 mg b.i.d.  4.  Plavix 75 mg daily.  5.  Toprol-XL 25 mg daily.  6.  UroXatral 5 mg daily.       MK/MEDQ  D:  06/01/2005  T:  06/01/2005  Job:  [id number]

## 2011-04-25 NOTE — Cardiovascular Report (Signed)
[name], [name] NO.:  [id number]   MEDICAL RECORD NO.:  [id number]          PATIENT TYPE:  INP   LOCATION:  2931                         FACILITY:  [facility]   PHYSICIAN:  Richard A. Alanda Amass, M.D.DATE OF BIRTH:  09-Nov-1918   DATE OF PROCEDURE:  05/30/2005  DATE OF DISCHARGE:                              CARDIAC CATHETERIZATION   PROCEDURE:  Retrograde central aortic catheterization, selective coronary  angiography, pre and post intracoronary nitroglycerin administration, left  ventricular angiogram, right anterior oblique/left anterior oblique  projection, abdominal angiogram, midstream posteroanterior projection,  subselective left internal mammary artery/right internal mammary artery,  weight-adjusted heparin, Plavix 300 mg p.o. additional, Aggrastat bolus plus  infusion, heparin, total of 2500 units, intravascular ultrasound  interrogation, mid right coronary artery, pre and post percutaneous coronary  intervention, intracoronary nitroglycerin administration, cutting balloon  atherectomy, mid right coronary artery, DES Scimed TAXUS 3.5/16 stent, high-  pressure inflation, mid right coronary artery, subsequent intravascular  ultrasound.   BRIEF HISTORY:  [name] is a long-term patient of mine who is  married,  lives with his wife (she was recently hospitalized), has known coronary  disease with remote PTCA (POBA) in the late 1980s or early '90s.  He has 1  child and no grandchildren, is a nonsmoker and a retired Visual merchandiser.  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  He has done well long-term  for many years.  He appears much younger than his stated age of 37 and he  remains very active.  He was last seen as an outpatient May '06 and is doing  well clinically, had a negative Cardiolite for ischemia, 2004, and has known  asymptomatic carotid disease followed on surveillance and abdominal aortic  aneurysm, 3 cm, on ultrasound October  '05 with  followup pending.  He was  admitted to the hospital on May 29, 2005 with new-onset exertional angina,  severe substernal chest pain with radiation with exertion and at rest.  Myocardial infarction was ruled out by serial enzymes and EKGs on medical  therapy in the hospital and the patient is referred for catheterization and  possible PCI.  He was in stable condition.  Informed consent was obtained.  Renal function was normal.  He was hydrated preoperatively and premedicated  with 5 mg of Valium p.o. premedication.  During the procedure, he was given  Versed 1 mg, fentanyl 25 mcg IV for sedation.   The RCFA was entered with a single anterior puncture using 18 thin-wall  needle and a 6-French short Daig sidearm sheath was inserted without  difficulty.  Diagnostic coronary angiography was done 6-French 4-cm tapered  preformed coronary and pigtail catheters.  Subselective LIMA and RIMA were  done with the right coronary catheter.  LV angiogram was done in the RAO and  LAO projections at 25 mL, 14 mL per second, 20 mL, 12 mL per second.  Pullback pressure in the CA was performed and it showed no gradient across  the aortic valve.  Abdominal angiogram was done above the level of the renal  arteries in the  PA projection at 25 mL, 20 mL per second, with visualization  to the mid-ilium.  A second injection, because of aneurysmal iliac  dilatation, was done above the iliac bifurcation at 20 mL, 20 mL per second.  Catheter was remove, sidearm sheath was flushed.  The patient tolerated the  diagnostic procedure well.  He was given 200 mcg of IC NTG into the RCA with  repeat injections obtained.   PRESSURES:  LV: 190/0; LVEDP 18 mmHg.   CA: 190/90 mmHg.   Pressure came down to 154-160 after IC nitroglycerin.  There was no gradient  across the aortic valve on catheter pullback.   LV angiogram demonstrated a vigorously and normally contracting LV with no  segmental wall motion abnormality, EF  approximately 60% and angiographic  LVH.  There was no significant MR present.   The renal arteries were single with no significant stenosis bilaterally.  SMA and celiac access were patent.  IMA was patent.  There was fusiform  dilatation of the distal third of the abdominal aorta extending into the  iliacs bilaterally.  There was aneurysmal dilatation, particularly of the  right common iliac to least a moderate degree and a mild-to-moderate degree  in the left common iliac.  The hypogastrics were intact.  There was no  significant common or external iliac stenosis and SFA-profunda junctions  were patent bilaterally.  The iliacs were tortuous and calcific.   The right brachiocephalic was patent.  The RIMA was patent.  The left  subclavian had calcification with no significant stenosis or gradient, was  patent and vertebrals were antegrade bilaterally.  There was approximately  70% left vertebral narrowing, but good antegrade flow.   Fluoroscopy showed 3 to 4+ calcification of the proximal LAD circumflex and  3+ calcification of the proximal and mid RCA.   The main left coronary artery was short, but there was no significant  stenosis.   The LAD had less than 30% narrowing proximally with good flow across the  apex of the heart, where it bifurcated.  There was irregularity with about  30% segmental narrowing in the proximal third; the remainder of the vessel  had no significant stenosis with good flow.  There was a small DX-1 from the  proximal third that was normal and moderate-sized DX-2 from the mid-portion  that was normal and a small-to-moderate DX-3 from the distal third that had  no significant stenosis.   The circumflex was nondominant, but gave off a large second marginal branch.  The first marginal branch was small and normal, followed by a PAVG branch.  The circumflex at the posterior bend had smooth 50% to 60% narrowing with good residual lumen and flow.  Beyond this, the  marginal branch bifurcated  and another PAVG branch was given off with no significant stenosis.   The right coronary is a large dominant vessel.  There were irregularities  with 20% to 30% narrowing throughout the proximal third and mid-portion with  good residual lumen.  There was calcium and high-grade focal web-like  hypodense stenosis at the acute margin estimated to be approximately 80% to  85% with sounding 50% to 70% narrowing, mildly segmental.  Beyond this, it  was fairly small, but lesser caliber and about 30% narrowing.  The PDA was  large and normal.  The PLA bifurcated and was normal.   IMPRESSION:  The patient's history is as outlined above.  Angiographically,  he has a problem with high-grade stenosis, fairly focal, in the right  coronary artery at the acute margin.   I went ahead and proceeded with IVUS interrogation in this setting.  This  was done with an Atlantis Pro Scimed IVUS.  The patient given an additional  300 mg of Plavix, Aggrastat bolus plus infusion, heparin 2500 units,  monitoring ACTs.  IC NTG 200 was administered.  The right coronary was  intubated with a Sci-Med JR4 guiding catheter and the lesion was crossed  with an ASAHI 0.014-inch soft guidewire.  An Atlantis Pro IVUS was then  positioned across the stenosis and pullback was recorded.   There was high-grade calcification and stenosis in the area visualized  fluoroscopically and the lumen was approximately 1.5 x 1.7 mm, eccentric and  hazy.  The reference vessel from media-to-media was 3.5 to 4.0 mm proximal  to this and slightly less distal to this.  It was elected to proceed with  PCI in this setting.  The IVUS was pulled back and the lesion was crossed  with a 3.0/10 Scimed cutting balloon.  The lesion was dilated at 7-30 and 8-  32 for cutting balloon atherectomy.  There was good balloon expansion and  the balloon was then exchanged for a 3.5/16 DES Scimed TAXUS stent, which  was positioned  fluoroscopically, deployed a 14-33 and post-dilated at 18-42.  The balloon was pulled back.  Angiography showed good result.  The balloon  was then exchanged for the IVUS catheter and final IVUS interrogation was  done.  There was circumferential calcium and approximately 30% to 40%  narrowing beyond the stented area with a residual lumen of 2.7 x 2.6 mm with  good flow and no dissection.  The stented area had a stented lumen of 3.5 x  3.4 mm with good stent approximation.  The IVUS was removed, final injection  done in the RAO projection, confirming good result.  Dilatation system was  removed.  Final ACT was 288.  Sidearm sheath was flushed, secured to the  skin and the patient was brought to the holding area for postoperative care  in stable condition.  We plan to continue Aggrastat for 18 hours, aspirin,  Plavix, statin, ACE inhibitor for his hypertension.  He will also be put on a PPI.  We will get followup abdominal and iliac ultrasound to assess his  aneurysmal dilatation of the iliacs and distal abdominal fusiform  dilatation.  He will also be on surveillance for his known asymptomatic  carotid disease.   CATHETERIZATION DIAGNOSES:  1.  Arteriosclerotic heart disease, remote POBA in 1980s, medical therapy      subsequent, negative Cardiolite, 2004.  2.  New-onset unstable angina without myocardial infarction.  3.  Culprit lesion high-grade mid right coronary artery stenosis treated      with cutting balloon atherectomy and subsequent DES 3.5/16 high-pressure      TAXUS stenting with intravascular ultrasound interrogation, pre and post      percutaneous coronary intervention.  4.  Noncritical left anterior descending disease.  5.  Fifty percent to 60% smooth mid-circumflex disease.  6.  Normal systolic function, left ventricular hypertrophy, ejection      fraction 60%.  7.  Hypertension with patent renal arteries.  8.  Hyperlipidemia.       RAW/MEDQ  D:  05/30/2005  T:   05/31/2005  Job:  [id number]   cc:   Fleet Contras, M.D.  [address]  [place]  [place] [address]  Fax: [phone]  Email: [email or web address] .[facility]

## 2011-04-25 NOTE — Discharge Summary (Signed)
. [facility]  Patient:    [name], [name] Visit Number: [id number] MRN: [id number]          Service Type: MED Location: [phone] Attending Physician:  Edwyna Perfect Dictated by:   Iline Oven. McKenzie, M.D. Admit Date:  11/30/2001 Discharge Date: 12/06/2001   CC:         [name], M.D.  Kelli Hope, M.D.  Fransisco Hertz, M.D.  Richard A. Alanda Amass, M.D.   Discharge Summary  DISCHARGE DIAGNOSES: 1. Acute mental status changes, cause unknown. 2. Hyponatremia. 3. Hypokalemia. 4. History of coronary artery disease, status post angioplasty 10 years ago. 5. Gastroesophageal reflux disease. 6. Benign prostatic hypertrophy, history of transurethral resection of    prostate greater than 10 years ago.  DISCHARGE MEDICATIONS: 1. Lopressor 25 mg b.i.d. 2. Aspirin 325 mg q.d. 3. Hytrin 1 mg q.h.s.  FOLLOW-UP:  With [name], M.D. in one to two weeks.  PROCEDURE:  Lumbar puncture performed on December 25.  CONSULTING PHYSICIANS:  Neurology with Kelli Hope, M.D., IP with Fransisco Hertz, M.D.  HISTORY OF PRESENT ILLNESS:  [name] is an 77 year old white male with past history of coronary artery disease who complains of confusion, nausea and vomiting, and headache.  His family said that he was fine the morning of admission. He then started having a headache and vomited three times.  He never complained of any abdominal pain.  On arrival to the ED, he was drowsy and not responding to commands.  He had complaints of decreased urine output this morning, but no dysuria.  He was treated two weeks ago for UTI with Cipro per his urologist.  He denies any fever, cough, or trauma.  At baseline, he was able to perform all of his ADLs.  MEDICATIONS: 1. Aspirin 325 mg q.d. 2. Toprol. 3. Hytrin.  ALLERGIES:  No known drug allergies.  FAMILY HISTORY:  His mother died, not sure what age.  She had a history of MI in the  past.  SOCIAL HISTORY:  He is retired.  He worked in a tobacco company.  He smoked tobacco up until 10 years ago.  He has one-pack-per-day x50 years history. Denies alcohol or drugs.  PHYSICAL EXAMINATION:  VITAL SIGNS: Temperature 98.7, blood pressure 149/64, heart rate 67, respirations 30, saturation 98% on 2 liters.  HEENT: Normocephalic and atraumatic.  Pupils are equally round and reactive to light and accommodation.  No dolls eyes.   Oropharynx and nasopharynx dry without lesion.  NEUROLOGICAL: No JVD, bruits, or lymphadenopathy.  CHEST: Clear to auscultation bilaterally.  HEART: Regular rate and rhythm with no murmurs, rubs, or gallops.  ABDOMEN: Soft.  Mild tenderness in the right upper quadrant.  No rebound or guarding.  Normal active bowel sounds.  EXTREMITIES: No edema, cyanosis, or clubbing.  NEUROLOGICAL: No focal neurologic deficits.  LABORATORY DATA:  WBC 6.6, hemoglobin 13.6, platelets 143, sodium 124, potassium 3.1, chloride 88, CO2 33, BUN 13, creatinine 1.1, glucose 132. Calcium 8.8.  Liver enzymes are all normal.  CT of the head showed no acute changes.  Right upper quadrant ultrasound shows no gallstones.  Chest x-ray shows no acute disease.  HOSPITAL COURSE:  #1 - Acute mental status changes.  [name] story was suspicious for an acute bleed.  CT of the head showed no bleed, but since his story was still quite concerning for a bleed, a lumbar puncture was performed.  The lumbar puncture showed some blood  in the CSF on the first two, but a significant decrease in blood by the time the last tube was drawn, signifying that the blood present was probably from traumatic tap.  It also showed 24 white blood cells in it.  Since these results were equivocal, neurology consult was called and they brought up the possibility of a partially treated meningitis.  This was considered because [name] had had recent antibiotic exposure.  Of note, is that he also spiked a  fever of 101.8 at one point during his hospitalization.  An ID consult was called to follow up this line of reasoning. The ID consult did not think that this was the case and asked if migraine headaches may have been the cause.  A neurology consult did not have a very high suspicion for migraines being the cause as [name] had not had a migraine in several decades.  A Harold bleed in the subarachnoid space could not be completely ruled out even with the LP and the CT of the head.  It was decided that [name] would require angiography if he had anymore episodes like this, but angiography was not considered necessary at this time.  His mental status recovered quite quickly upon admission to the hospital and at the time of discharge, he was thinking clearly again and able to perform all of his ADLs.  #2 - Hyponatremia.  [name] had a sodium level of 124 at the time of admission.  His urine sodium at the same time was greater than 100.  Of note, he has had a history of hyponatremia in the past that has never been satisfactorily explained.  He was not currently on diuretics.  Low sodium can also be associated with subarachnoid hemorrhage, although, this would not explain why he had had low sodium in the past.  [name] was given normal saline to try to replace his sodium and at the time of discharge, his sodium was at a level of 131.  It was considered that his low sodium may have been the cause of his mental status changes, although, his value we saw was not normally low enough to do this.  DISCHARGE LABORATORY DATA:  Sodium 131, potassium 4.0, chloride 95, CO2 30, glucose 98, BUN 14, creatinine 1.1, calcium 8.8, WBC 3.9, hemoglobin 12.8, platelets 139. Dictated by:   Iline Oven. McKenzie, M.D.  Attending Physician:  Edwyna Perfect DD:  03/05/02 TD:  03/06/02 Job: [id number]

## 2011-04-25 NOTE — Discharge Summary (Signed)
NAME:  [name], [name]                            ACCOUNT NO.:  [id number]   MEDICAL RECORD NO.:  [id number]                   PATIENT TYPE:  INP   LOCATION:  0444                                 FACILITY:  [facility]   PHYSICIAN:  Fleet Contras, M.D.                 DATE OF BIRTH:  [birth year]   DATE OF ADMISSION:  04/20/2003  DATE OF DISCHARGE:  04/22/2003                                 DISCHARGE SUMMARY   PRESENTING COMPLAINTS:  1. Nausea and vomiting.  2. Lower abdominal pain.  3. Dribbling of the urine.   HISTORY OF PRESENT ILLNESS:  The patient is an 77 year old Caucasian  gentleman who was admitted via the emergency room at [facility]  where he presented with progressively worsening urinary strain for the last  few weeks.  This was associated with dribbling of urine with lower abdominal  pain.  On the day of admission, the patient was seen in the office by the  urologist, Dr. Earlene Plater, where apparently he was having some nausea and  vomiting while there.  The patient was therefore transferred to the  emergency room at [facility] for further evaluation.  In relation  to the emergency room, the patient was noted not to have any diarrhea or  constipation, no dysuria or hematuria.  He had no shortness of breath, chest  pain, palpitations, or dizziness.  He had no fevers or chills.  He did have  some mild dehydration with a distended bladder.  Foley catheterization was  performed and this released about 500 mL of urine.  Initial laboratory  studies showed sodium of 119.  The patient was felt to have urinary  retention and hyponatremia and therefore was admitted to the hospital for  further evaluation and appropriate therapy.   HOSPITAL COURSE:  On admission to the hospital, the patient was started on  intravenous fluids with normal saline for gentle hydration as well as sodium  replenishment.  Sodium levels were monitored and input and output also  monitored.  His  indwelling Foley catheter was left in place.  Twenty-four  hours after admission, the patient continued to feel much better, had no  further nausea or vomiting, and no fevers.  His vital signs were stable.  His sodium had improved to 130.  The case was discussed with Dr. Earlene Plater, his  urologist, who felt that the patient was fairly stable for a discharge home  and will follow up with him in the office the following week.  On Apr 22, 2003, the patient was seen with no new complaints, no nausea or vomiting,  was afebrile with normal vital signs.  His abdomen was soft and nontender.  His Foley catheter was draining clear urine.  The patient was thought to  have had the possibility of acute viral syndrome and that the hyponatremia  was due to excessive water intake __________  improve his urinary function.  Was therefore advised to stop hydrochlorothiazide which he was on for his  blood pressure.   ADMISSION DIAGNOSES:  1. Hyponatremia.  2. Urinary retention.  3. History of hypertension.  4. History of hyperlipidemia.  5. History of coronary artery disease.  6. Acute viral syndrome.   DISCHARGE DIAGNOSES:  1. Hyponatremia.  2. Urinary retention.  3. History of hypertension.  4. History of hyperlipidemia.  5. History of coronary artery disease.  6. Acute viral syndrome.   DISCHARGE MEDICATIONS:  1. Lipitor 40 mg once a day.  2. Zantac 150 mg b.i.d.  3. Hytrin 5 mg daily.  4. Toprol-XL 25 mg daily.  5. Altace 2.5 mg daily.  6. Hydrochlorothiazide was put on hold.   FOLLOW UP:  The patient was to follow up with Dr. Earlene Plater Apr 24, 2003 and  with Dr. Concepcion Elk in two weeks.                                               Fleet Contras, M.D.    EA/MEDQ  D:  04/30/2003  T:  04/30/2003  Job:  [id number]

## 2011-08-25 ENCOUNTER — Encounter: Payer: Self-pay | Admitting: Vascular Surgery

## 2011-08-29 ENCOUNTER — Encounter (HOSPITAL_COMMUNITY)
Admission: RE | Admit: 2011-08-29 | Discharge: 2011-08-29 | Disposition: A | Discharge status: Home / Self Care | Payer: Medicare Other | Source: Ambulatory Visit | Attending: Specialist | Admitting: Specialist

## 2011-08-29 LAB — CBC
HCT: 35.4 % — ABNORMAL LOW (ref 39.0–52.0)
Hemoglobin: 12.3 g/dL — ABNORMAL LOW (ref 13.0–17.0)
RDW: 13.7 % (ref 11.5–15.5)
WBC: 5.6 10*3/uL (ref 4.0–10.5)

## 2011-08-29 LAB — URINALYSIS, ROUTINE W REFLEX MICROSCOPIC
Hgb urine dipstick: NEGATIVE
Leukocytes, UA: NEGATIVE
Nitrite: NEGATIVE
Specific Gravity, Urine: 1.017 (ref 1.005–1.030)
Urobilinogen, UA: 0.2 mg/dL (ref 0.0–1.0)

## 2011-08-29 LAB — SURGICAL PCR SCREEN
MRSA, PCR: NEGATIVE
Staphylococcus aureus: NEGATIVE

## 2011-08-29 LAB — COMPREHENSIVE METABOLIC PANEL
Albumin: 3.4 g/dL — ABNORMAL LOW (ref 3.5–5.2)
BUN: 18 mg/dL (ref 6–23)
Calcium: 9.2 mg/dL (ref 8.4–10.5)
Creatinine, Ser: 0.8 mg/dL (ref 0.50–1.35)
GFR calc Af Amer: >60 mL/min (ref >60)
Glucose, Bld: 89 mg/dL (ref 70–99)
Potassium: 4 mEq/L (ref 3.5–5.1)
Total Protein: 6.3 g/dL (ref 6.0–8.3)

## 2011-08-29 LAB — PROTIME-INR
INR: 1.07 (ref 0.00–1.49)
Prothrombin Time: 14.1 seconds (ref 11.6–15.2)

## 2011-08-29 LAB — DIFFERENTIAL
Basophils Absolute: 0 10*3/uL (ref 0.0–0.1)
Basophils Relative: 1 % (ref 0–1)
Eosinophils Relative: 2 % (ref 0–5)
Lymphocytes Relative: 21 % (ref 12–46)
Neutro Abs: 3.6 10*3/uL (ref 1.7–7.7)

## 2011-09-02 LAB — DIFFERENTIAL
Eosinophils Absolute: 0.1
Eosinophils Relative: 3
Lymphocytes Relative: 18
Lymphs Abs: 0.8
Monocytes Absolute: 0.5

## 2011-09-02 LAB — URINALYSIS, ROUTINE W REFLEX MICROSCOPIC
Nitrite: NEGATIVE
Protein, ur: NEGATIVE
Specific Gravity, Urine: 1.015
Urobilinogen, UA: 0.2

## 2011-09-02 LAB — COMPREHENSIVE METABOLIC PANEL
ALT: 15
AST: 23
Albumin: 3.3 — ABNORMAL LOW
CO2: 28
Calcium: 8.8
Chloride: 100
Creatinine, Ser: 1.13
GFR calc Af Amer: >60
GFR calc non Af Amer: >60
Sodium: 133 — ABNORMAL LOW
Total Bilirubin: 0.4

## 2011-09-02 LAB — POCT CARDIAC MARKERS
CKMB, poc: 1.1
Myoglobin, poc: 94.2
Operator id: 166561
Troponin i, poc: <0.05

## 2011-09-02 LAB — CBC
MCV: 89
Platelets: 140 — ABNORMAL LOW
RBC: 3.65 — ABNORMAL LOW
WBC: 4.4

## 2011-09-02 LAB — B-NATRIURETIC PEPTIDE (CONVERTED LAB): Pro B Natriuretic peptide (BNP): <30

## 2011-09-05 ENCOUNTER — Inpatient Hospital Stay (HOSPITAL_COMMUNITY): Payer: Medicare Other

## 2011-09-05 ENCOUNTER — Inpatient Hospital Stay (HOSPITAL_COMMUNITY)
Admission: RE | Admit: 2011-09-05 | Discharge: 2011-09-09 | DRG: 490 | Disposition: A | Discharge status: Skilled Nursing Facility | Payer: Medicare Other | Source: Ambulatory Visit | Attending: Specialist | Admitting: Specialist

## 2011-09-05 DIAGNOSIS — F17200 Nicotine dependence, unspecified, uncomplicated: Secondary | ICD-10-CM | POA: Diagnosis present

## 2011-09-05 DIAGNOSIS — E871 Hypo-osmolality and hyponatremia: Secondary | ICD-10-CM | POA: Diagnosis not present

## 2011-09-05 DIAGNOSIS — I10 Essential (primary) hypertension: Secondary | ICD-10-CM | POA: Diagnosis present

## 2011-09-05 DIAGNOSIS — Z79899 Other long term (current) drug therapy: Secondary | ICD-10-CM

## 2011-09-05 DIAGNOSIS — Z9861 Coronary angioplasty status: Secondary | ICD-10-CM

## 2011-09-05 DIAGNOSIS — E785 Hyperlipidemia, unspecified: Secondary | ICD-10-CM | POA: Diagnosis present

## 2011-09-05 DIAGNOSIS — K449 Diaphragmatic hernia without obstruction or gangrene: Secondary | ICD-10-CM | POA: Diagnosis present

## 2011-09-05 DIAGNOSIS — N400 Enlarged prostate without lower urinary tract symptoms: Secondary | ICD-10-CM | POA: Diagnosis present

## 2011-09-05 DIAGNOSIS — Z7902 Long term (current) use of antithrombotics/antiplatelets: Secondary | ICD-10-CM

## 2011-09-05 DIAGNOSIS — Z7982 Long term (current) use of aspirin: Secondary | ICD-10-CM

## 2011-09-05 DIAGNOSIS — K219 Gastro-esophageal reflux disease without esophagitis: Secondary | ICD-10-CM | POA: Diagnosis present

## 2011-09-05 DIAGNOSIS — F329 Major depressive disorder, single episode, unspecified: Secondary | ICD-10-CM | POA: Diagnosis present

## 2011-09-05 DIAGNOSIS — D62 Acute posthemorrhagic anemia: Secondary | ICD-10-CM | POA: Diagnosis not present

## 2011-09-05 DIAGNOSIS — M4120 Other idiopathic scoliosis, site unspecified: Secondary | ICD-10-CM | POA: Diagnosis present

## 2011-09-05 DIAGNOSIS — H3530 Unspecified macular degeneration: Secondary | ICD-10-CM | POA: Diagnosis present

## 2011-09-05 DIAGNOSIS — F3289 Other specified depressive episodes: Secondary | ICD-10-CM | POA: Diagnosis present

## 2011-09-05 DIAGNOSIS — I2510 Atherosclerotic heart disease of native coronary artery without angina pectoris: Secondary | ICD-10-CM | POA: Diagnosis present

## 2011-09-05 DIAGNOSIS — Z8673 Personal history of transient ischemic attack (TIA), and cerebral infarction without residual deficits: Secondary | ICD-10-CM

## 2011-09-05 DIAGNOSIS — Z01812 Encounter for preprocedural laboratory examination: Secondary | ICD-10-CM

## 2011-09-05 DIAGNOSIS — M48062 Spinal stenosis, lumbar region with neurogenic claudication: Principal | ICD-10-CM | POA: Diagnosis present

## 2011-09-05 HISTORY — PX: BACK SURGERY: SHX140

## 2011-09-06 LAB — HEMOGLOBIN AND HEMATOCRIT, BLOOD
HCT: 24.8 % — ABNORMAL LOW (ref 39.0–52.0)
Hemoglobin: 8.5 g/dL — ABNORMAL LOW (ref 13.0–17.0)

## 2011-09-06 LAB — BASIC METABOLIC PANEL
Chloride: 98 mEq/L (ref 96–112)
Creatinine, Ser: 0.84 mg/dL (ref 0.50–1.35)
GFR calc Af Amer: >60 mL/min (ref >60)
Potassium: 3.8 mEq/L (ref 3.5–5.1)
Sodium: 130 mEq/L — ABNORMAL LOW (ref 135–145)

## 2011-09-07 LAB — HEMOGLOBIN AND HEMATOCRIT, BLOOD
HCT: 22.4 % — ABNORMAL LOW (ref 39.0–52.0)
Hemoglobin: 7.6 g/dL — ABNORMAL LOW (ref 13.0–17.0)

## 2011-09-07 LAB — BASIC METABOLIC PANEL
BUN: 18 mg/dL (ref 6–23)
GFR calc Af Amer: >60 mL/min (ref >60)
GFR calc non Af Amer: >60 mL/min (ref >60)
Potassium: 3.8 mEq/L (ref 3.5–5.1)

## 2011-09-07 LAB — PREPARE RBC (CROSSMATCH)

## 2011-09-08 LAB — TYPE AND SCREEN
ABO/RH(D): A NEG
Unit division: 0

## 2011-09-08 LAB — BASIC METABOLIC PANEL
CO2: 27 mEq/L (ref 19–32)
Calcium: 8 mg/dL — ABNORMAL LOW (ref 8.4–10.5)
GFR calc non Af Amer: 79 mL/min — ABNORMAL LOW (ref >90)
Sodium: 129 mEq/L — ABNORMAL LOW (ref 135–145)

## 2011-09-08 LAB — CBC
MCH: 30.7 pg (ref 26.0–34.0)
MCHC: 33.9 g/dL (ref 30.0–36.0)
Platelets: 101 10*3/uL — ABNORMAL LOW (ref 150–400)
RBC: 3.03 MIL/uL — ABNORMAL LOW (ref 4.22–5.81)

## 2011-09-09 LAB — BASIC METABOLIC PANEL
GFR calc Af Amer: 87 mL/min — ABNORMAL LOW (ref >90)
GFR calc non Af Amer: 75 mL/min — ABNORMAL LOW (ref >90)
Potassium: 3.6 mEq/L (ref 3.5–5.1)
Sodium: 129 mEq/L — ABNORMAL LOW (ref 135–145)

## 2011-09-09 NOTE — Op Note (Signed)
[name], [name] NO.:  [id number]  MEDICAL RECORD NO.:  [id number]  LOCATION:  5002                         FACILITY:  [facility]  PHYSICIAN:  Kerrin Champagne, M.D.   DATE OF BIRTH:  December 12, 1917  DATE OF PROCEDURE:  09/05/2011 DATE OF DISCHARGE:                              OPERATIVE REPORT   PREOPERATIVE DIAGNOSES:  Lumbar spinal stenosis with significant collapsing degenerative kyphosis and scoliosis, lumbar spine, most severe central stenosis at L4-L5, bilateral lateral recess stenosis L5- S1, and moderate spinal stenosis involving lateral recess right L2-L3.  POSTOPERATIVE DIAGNOSES:  Lumbar spinal stenosis with significant collapsing degenerative kyphosis and scoliosis, lumbar spine, most severe central stenosis at L4-L5, bilateral lateral recess stenosis L5- S1, and moderate spinal stenosis involving lateral recess right L2-L3.  PROCEDURE:  Central decompressive laminectomy L4-L5 and L5-S1, right L2- L3 lateral recess decompression with partial hemilaminectomy. Microscope was used during this procedure.  SURGEON:  Kerrin Champagne, MD  ASSISTANT:  Wende Neighbors, PA-C  ANESTHESIA:  General via orotracheal intubation, Dr. Diamantina Monks, supplemented with local infiltration with Marcaine 0.5% with 1:200,000 epinephrine, 10 mL.  ESTIMATED BLOOD LOSS:  200 mL.  CELL SAVER BLOOD RETURNED:  Zero milliliters.  DRAINS:  Hemovac x1 medium, right lower lumbar and Foley to straight drain.  BRIEF CLINICAL HISTORY:  The patient is a 77 year old male with progressive increasing neurogenic claudication, who has undergone a surgical procedure for abdominal aortic aneurysm almost 7 months ago. He has had persistent weakness in his legs with standing and ambulation which did not improve without changing position, either bending, stooping, or sitting.  The patient has found difficulty with any prolonged standing or walking of any distance greater than 50 feet.   He notes that while at grocery store, he has to stoop over cart in order to make it round.  He has had pain primarily in the right side, right knee greater than left with bilateral leg pain that is worse with standing and ambulation, improves with sitting and lying down.  He has undergone MRI study which demonstrated severe lumbar spinal stenosis centrally at L4-L5, bilateral lateral recess stenosis at L5-S1 with foraminal stenosis as well and right-sided moderate L2-L3 lateral recess stenosis. He has failed attempts at epidural steroid injections.  He is worsening to the point where he is not capable of performing his usual activities of daily living and standing, walking, going to the mailbox.  The patient understands the risks and benefits of procedure which were discussed in the office and signed informed consent.  DESCRIPTION OF PROCEDURE:  This patient was seen in the preoperative holding area.  He had marking of his back right side L2-L3 and central L4-L5 and L5-S1.  I had discussion regarding his surgery as initially he was scheduled just for central laminectomy at the L4-L5 and L5-S1 level. As a predominant area, pain has been in his right leg.  The findings on this study which indicate only mild stenosis in the lateral recess on the right side at L2-L3 become more significant and the study itself shows rather severe foraminal entrapment that would affect the right L3 nerve root.  With discussion,  it is apparent that he should undergo decompression at the same time of central laminectomy for the superior stenosis at L4-L5 and L5-S1.  He understands the risks and benefits of extending the surgery to the right side at L2-L3 and this will be done as an approach to the right side for a hemilaminectomy procedure to decompress the lateral recess.  Risks and benefits were further described.  I modified the consent form with him and we both initialed and signed.  All questions were  answered.  He received standard preoperative antibiotics of Ancef.  He was transported to the OR via a stretcher.  OR room #15 at [facility] was used for the procedure.  He underwent induction of general anesthesia on the stretcher atraumatically, had a Foley catheter placed and after stabilization, he was then turned to a prone position.  A Wilson frame was used with sliding table and all pressure points well padded and the arms on the side with shoulder rolls and PAS hose to prevent DVT.  All pressure points were padded.  He had standard prep, lower dorsal spine to mid sacrum with DuraPrep solution, was draped in the usual manner. Iodine Vi-Drape was used.  Standard time-out protocol identifying the patient, procedure to be done, expected length of time, estimated blood loss.  With this then, incision was marked midline expected L1-S1 incision.  The skin and subcu layers were infiltrated with Marcaine 0.5% 1:200,000 epinephrine.  Incision was then made extending from L2 through S1 in the midline through the skin and subcu layers using a 10 blade scalpel down to the spinous processes.  The L3 spinous process was the most prominent which gave [place] deformity over the mid lumbar spine. Incision was then carried lateral to the spinous processes in L2, L3, L4, L5, and S1.  Cobb was used to elevate the paralumbar muscles centrally at the lower levels L4, L3, L5, and S1 and bleeders controlled using monopolar electrocautery and bipolar electrocautery. Intraoperative radiograph obtained with a clamp under the spinous process of S1 and above S1 spinous process so that this was marked as interval between the L5 and S1 spinous process.  From here then, the L4 spinous process was identified, L3 was marked and then L2 identified. Note that upper clamp noted to be at about the L3 upper level.  With this then, dissection was carried along the right side at the L2 and L3 level and the lamina of  L3 carefully identified as it tracked lateral. Interval between the L2 lamina and L3 lamina was identified and carefully exposed using Cobb elevation and electrocautery.  Loupe magnification and headlamp was used during this portion of the procedure.  This area was then packed and attention turned to the central laminotomy region from L3 to sacrum.  Then, [employer] retractor was inserted to obtain excellent exposure.  Leksell rongeur was then used to remove the spinous process of L5 and of L4 centrally, inferior half of the L3 spinous process and superior third of the spinous process of S1.  Central portions of the lamina at L4 and L5 were then pinned using a Leksell rongeur.  Soft tissue between the lamina was also carefully debrided using Leksell rongeur, facet capsules preserved. Osteotomes were then used to resect a portion of the medial aspect of the inferior articular process of L5 bilaterally, exposing the superior articular process of S1 both sides.  Rongeur was used to further thin the lamina residual and then two 3-mm Kerrison used  to resect central portions of the lamina at the L5 level.  This was done without difficulty.  Continuation of dissection superiorly, cottonoids were passed beneath the neural arch of the superior extent of the lamina of L5 and then this was divided using 3-mm and 2-mm Kerrison.  The laminectomy was continued superiorly where the bony lamina at the L4 level was resected centrally using 3-mm and 2-mm Kerrison up to the L3- L4 level.  Osteotomes were then used to osteotomize the lateral aspects of the central laminotomy, the medial 15%-20% facets of the L5-S1 and L4- L5 levels were osteotomized and then resected using pituitary rongeurs or 2-mm Kerrison to decompress the thecal sac here.  Foraminotomy was performed over the S1 nerve roots bilaterally and the medial portion of the superior articular process of S1 was carefully osteotomized  and resected, removing the reflected portion of ligamentum flavum at the L5- S1 level, decompressing lateral recesses.  Continuing superiorly similarly, the osteotomy of the superior articular process of L5 bilaterally allowed for lateral recess to be well decompressed. Beginning then at the L3-L4 level, the lateral recess of L3-L4 was carefully decompressed using 2 and 3-mm Kerrison, removing hypertrophic flavum over the lateral aspect of the thecal sac from the L4-L5 facets bilaterally.  This completed then the final decompression phase was carried out from cranial caudal on the left side first excising hypertrophic flavum at the L3-L4, L4-L5, L5-S1 levels, passing a hockey stick neural probe and out the neural foramen on the left at L4, L5, and S1 demonstrating the patency of these neural foramen and excising any reflected flavum along the medial facet at this area to allow for the nerve roots to exit without further impingement.  Similarly, this was done on the right side again beginning from cranial to caudal and carefully retracted the nerve root thecal sac to excise hypertrophic flavum, pressing on the thecal sac and this was done at the L3-L4, L4- L5, L5-S1 performing foraminotomy over the L3, L4, L5, and S1 levels both sides.  Bleeders controlled using bone wax applied to the bleeding cancellous bone surfaces, thrombin-soaked Gelfoam and bipolar where necessary.  With this central laminectomy completed, attention was turned to the hemilaminectomy along the right-sided L2-L3.  Here, exposure was obtained and the [facility] retractor replaced at this upper level for exposure purposes.  A high-speed bur was used to remove bone from the lateral aspect of the spinous process and lamina to allow for examination of the laminectomy region.  Note that the distance between the medial aspect of the facet and the spinous process was quite narrowed, so 3-mm Kerrison could not be passed  into this area.  Once the interlaminar area was exposed, operating room microscope was carefully draped sterilely and brought into the field and the operating room microscope and high-speed bur was used to carefully bur and drill residual 20% right L2-L3 facet inferior articular process down to the superior articular process.  Ligamentum flavum was redundant posteriorly and this was resected using both pituitaries, then 2-mm Kerrison was used to resect the remaining portion of this flavum that was reflected along the medial aspect of the L2-L3 placenta.  Foraminotomy was then performed over the L3 nerve root on the right side and then the decompression further carried out by resecting the medial aspect of the superior articular process of L3, decompressing the lateral recess and the neural foramen for the left L2 nerve root.  After this was completed, then a hockey stick neural  probe could be passed out over the L3 nerve root upon the left side and out the neural foramen for L2. This completed the decompression phase of the operation.  Bone wax was applied to the bleeding cancellus bone surfaces.  Gelfoam where it had been applied was carefully removed, thrombin soaked in these areas.  Excess bone wax was removed.  The patient showed gentle oozing throughout the case, however, we felt that there was only 200 mL of blood loss so that Cell Saver return blood could not be performed. After further irrigation then, removal of all Gelfoam, a medium Hemovac drain was placed in the depth of the incision exiting out over the right lower lumbar area and placed within the central laminectomy, then carried up over the right side of the L3 and then L2-L3 level.  The paralumbar muscles were then carefully approximated in the midline loosely with interrupted #1 Vicryl sutures.  The patient then had the lumbodorsal fascia reapproximated with interrupted simple sutures of #1 Vicryl, reattaching to the  spinous process of S1, S2 inferiorly and superiorly at L3 and then to itself above the L3 level and below L3 using #1 Vicryl.  The deep subcu layers were approximated with interrupted 0 Vicryl sutures.  Skin was closed with a running subcu stitch of 4-0 Vicryl following approximation of the subcu layers with 2- 0 Vicryl.  Dermabond was then applied and Mepilex bandage.  Drain was charged.  The patient was then returned to a stretcher, reactivated, extubated, then returned to recovery room in satisfactory condition. All instrument and sponge counts were correct.  PHYSICIAN ASSISTANT'S RESPONSIBILITIES:  Maud Deed performed the duties of Designer, television/film set.  She was present from the very beginning of the case to the very end of the case, assisted in transfer of the patient to the OR table from a stretcher, assisted then in the exposure with cauterization of blood vessels and careful retraction.  During the procedure, she performed careful retraction and suctioning of neural elements during the central decompression and right-sided L2-L3 semi- hemilaminectomy.  She operated under the operating room microscope and at the end of the case, she performed closure of the fascia to the skin and application of dressings.    Kerrin Champagne, M.D.    JEN/MEDQ  D:  09/05/2011  T:  09/06/2011  Job:  [id number]  Electronically Signed by Vira Browns M.D. on 09/09/2011 04:54:09 PM

## 2011-09-09 NOTE — Discharge Summary (Signed)
NAMEMEKHAI, VENUTO NO.:  [id number]  MEDICAL RECORD NO.:  [id number]  LOCATION:  5002                         FACILITY:  [facility]  PHYSICIAN:  Kerrin Champagne, M.D.   DATE OF BIRTH:  August 04, 1918  DATE OF ADMISSION:  09/05/2011 DATE OF DISCHARGE:  09/09/2011                              DISCHARGE SUMMARY   ADMISSION DIAGNOSES: 1. Lumbar spinal stenosis with significant collapsing degenerative     kyphosis and scoliosis of the lumbar spine most severe with central     stenosis at L4-5, bilateral lateral recess stenosis L5-S1, and     moderate spinal stenosis involving lateral recess right L2-3. 2. Gastroesophageal reflux disease with history of esophageal     dilatation. 3. Coronary artery disease status post cardiac stent and angioplasty. 4. Hypertension. 5. Depression 6. Benign prostatic hypertrophy status post TURP in the 1980s. 7. Bilateral cataract removal and lens implants. 8. History of hyperlipidemia. 9. History of transient ischemic attack. 10.Macular degeneration. 11.Status post right common iliac artery aneurysms with stent graft     repair and coiling April 2012.  DISCHARGE DIAGNOSES: 1. Lumbar spinal stenosis with significant collapsing degenerative     kyphosis and scoliosis of the lumbar spine most severe with central     stenosis at L4-5, bilateral lateral recess stenosis L5-S1, and     moderate spinal stenosis involving lateral recess right L2-3. 2. Gastroesophageal reflux disease with history of esophageal     dilatation. 3. Coronary artery disease status post cardiac stent and angioplasty. 4. Hypertension. 5. Depression 6. Benign prostatic hypertrophy status post TURP in the 1980s. 7. Bilateral cataract removal and lens implants. 8. History of hyperlipidemia. 9. History of transient ischemic attack. 10.Macular degeneration. 11.Status post right common iliac artery aneurysms with stent graft     repair and coiling April  2012. 12.Postoperative hyponatremia. 13.Posthemorrhagic anemia requiring blood transfusion.  PROCEDURE:  On September 05, 2011, the patient underwent central decompressive laminectomy L4-5 and L5-S1, right L2-3 lateral recess decompression with partial hemilaminectomy.  Microscope used during the procedure.  This was performed by Dr. Otelia Sergeant, assisted by Maud Deed, [facility] under general anesthesia.  CONSULTATIONS:  None.  BRIEF HISTORY:  The patient is a 77 year old male with progressive increasing neurogenic claudication.  He has had persistent weakness in bilateral lower extremities with standing and ambulation with no improvement and positional changes.  He has difficulty with walking greater than 50 feet and is unable to carry out some of his activities of daily living.  His leg pain is primarily in the right greater than the left.  He has undergone MRI study showing severe lumbar spinal stenosis centrally at L4-5, bilateral lateral recess stenosis at L5-S1 as well as foraminal stenosis, and also right-sided moderate L2-3 lateral recess stenosis.  He has had epidural steroid injections which failed to improve his symptoms.  He has undergone activity modifications.  He continues to have significant difficulty with performing his activities of daily living.  He does live alone with family support.  It was felt that he would benefit from surgical intervention to help regain his ability for activity.  He was admitted for the procedure as stated  above.  BRIEF HOSPITAL COURSE:  The patient tolerated the procedure under general anesthesia without complications.  Postoperatively neurovascular motor function of the lower extremities was noted to be intact.  Hemovac drain was discontinued on the first postoperative day and his wound was found to be healing well without drainage or edema during the hospital stay.  The patient's hemoglobin dropped to 7.6 with hematocrit 22.4 on the second  postoperative day and he did receive 2 units of packed red blood cells.  Following the transfusion, hemoglobin and hematocrit returned at 9.3 and 27.4.  The patient's diet was held until bowel sounds were present and the patient was having flatus.  He was then able to be advanced to a regular diet and tolerated this without nausea or vomiting.  His abdomen was soft, nontender with bowel sounds.  He continued to have large amounts of flatus but at the time of discharge only had a small bowel movement.  The patient was able to void independently after his Foley catheter was discontinued.  He does have history of BPH and was monitored closely for urinary retention, however, did not show any signs of this and seemed to be emptying his bladder completely.  The patient was afebrile and vital signs were stable throughout the remainder of the hospital stay.  Physical therapy was initiated and eventually he was able to ambulate 150 feet using a rolling walker.  He was noted to tend to keep the rolling walker away from his body and required verbal cuing to assist with back precautions. The patient received occupational therapy for ADLs and it was recommended that he be discharged to skilled nursing facility for prolonged postoperative care and rehabilitation.  The patient was on p.o. analgesics at the time of discharge with good relief of his pain. He did have hyponatremia with values as low as 129.  In reviewing his previous records, he did have history of hyponatremia previously.  He remained stable for 3 days at 129 without symptoms from the hyponatremia.  On September 09, 2011, the patient was stable for discharge. The patient and family had made previous arrangements to utilize the facilities at [facility] for his rehabilitation.  He was transferred there in stable condition.  PLAN:  At the facility the patient should continue to receive physical therapy on a daily basis.  He should be  monitored closely for back precautions.  He should have progressive ambulation using a rolling walker.  He may graduate from the rolling walker once he is stable.  He has a corset brace to utilize when he is ambulatory.  The brace may be donned and doffed in the sitting or standing position.  It is not necessary for times other than when he is ambulating.  Occupational Therapy to assist with ADLs.  The patient does plan on returning to his home at an independent level.  Dressing changes should be done daily or as needed.  The wound will be covered for a total of 2 weeks postoperatively.  The patient may shower with the wound being wet for hygiene purposes only.  The wound is covered with Dermabond and there are no staples or stitches to be removed.  At the end of 2 weeks, the dressings may be discontinued.  The patient should continue on a regular diet.  He is to follow up with Dr. Otelia Sergeant 2 weeks from the date of surgery.  MEDICATIONS:  He is given a prescription for Percocet 5/325, 1 every 4 hours as needed  for pain.  His home medications will remain the same and these include: 1. Amlodipine 5 mg daily. 2. Aspirin 81 mg daily. 3. Finasteride 5 mg daily at bedtime. 4. Fish oil daily. 5. ICaps vitamins twice daily. 6. Iron 65 mg daily. 7. Lipitor 20 mg daily. 8. Lyrica 50 mg daily at bedtime. 9. Metamucil q. morning. 10.Metoprolol XL 25 mg 1/2 tablet b.i.d. 11.MiraLax at bedtime. 12.Multivitamin daily. 13.Plavix 75 mg daily. 14.Prevacid 30 mg daily. 15.Tylenol Extra Strength 1 every 4 hours as needed for mild-to-     moderate pain. 16.Uroxatral 10 mg daily at bedtime.  CONDITION ON DISCHARGE:  Stable.  All questions encouraged and answered.     Wende Neighbors, P.A.   ______________________________ Kerrin Champagne, M.D.    SMV/MEDQ  D:  09/09/2011  T:  09/09/2011  Job:  [id number]  cc:   Kerrin Champagne, M.D.  Electronically Signed by Dorna Mai. on 09/09/2011  04:17:31 PM Electronically Signed by Vira Browns M.D. on 09/09/2011 04:54:09 PM

## 2011-09-22 LAB — BASIC METABOLIC PANEL
BUN: 12
BUN: 9
CO2: 24
CO2: 26
CO2: 27
Calcium: 7.8 — ABNORMAL LOW
Calcium: 8.1 — ABNORMAL LOW
Calcium: 8.6
Chloride: 80 — ABNORMAL LOW
Chloride: 84 — ABNORMAL LOW
Chloride: 92 — ABNORMAL LOW
Chloride: 92 — ABNORMAL LOW
Creatinine, Ser: 0.83
Creatinine, Ser: 0.9
GFR calc Af Amer: >60
GFR calc Af Amer: >60
GFR calc Af Amer: >60
GFR calc Af Amer: >60
GFR calc non Af Amer: >60
GFR calc non Af Amer: >60
GFR calc non Af Amer: >60
Glucose, Bld: 89
Potassium: 3.9
Potassium: 4
Potassium: 4.2
Sodium: 111 — CL
Sodium: 124 — ABNORMAL LOW
Sodium: 124 — ABNORMAL LOW
Sodium: 127 — ABNORMAL LOW

## 2011-09-22 LAB — OSMOLALITY: Osmolality: 234 — ABNORMAL LOW

## 2011-09-22 LAB — URINALYSIS, ROUTINE W REFLEX MICROSCOPIC
Hgb urine dipstick: NEGATIVE
Specific Gravity, Urine: 1.011
Urobilinogen, UA: 0.2

## 2011-09-22 LAB — I-STAT 8, (EC8 V) (CONVERTED LAB)
BUN: 13
Chloride: 82 — ABNORMAL LOW
Hemoglobin: 12.6 — ABNORMAL LOW
pCO2, Ven: 42.6 — ABNORMAL LOW
pH, Ven: 7.384 — ABNORMAL HIGH

## 2011-09-22 LAB — CBC
HCT: 39
Hemoglobin: 13.2
RBC: 4.24
WBC: 4.8

## 2011-09-22 LAB — POCT CARDIAC MARKERS
CKMB, poc: 1.5
Myoglobin, poc: 135

## 2011-10-10 ENCOUNTER — Other Ambulatory Visit: Payer: Self-pay | Admitting: Vascular Surgery

## 2011-10-10 LAB — CREATININE, SERUM: Creat: 0.92 mg/dL (ref 0.50–1.35)

## 2011-10-10 LAB — BUN: BUN: 13 mg/dL (ref 6–23)

## 2011-10-13 ENCOUNTER — Encounter: Payer: Self-pay | Admitting: Vascular Surgery

## 2011-10-14 ENCOUNTER — Ambulatory Visit
Admission: RE | Admit: 2011-10-14 | Discharge: 2011-10-14 | Disposition: A | Discharge status: Home / Self Care | Payer: Medicare Other | Source: Ambulatory Visit | Attending: Vascular Surgery | Admitting: Vascular Surgery

## 2011-10-14 ENCOUNTER — Encounter: Payer: Self-pay | Admitting: Vascular Surgery

## 2011-10-14 ENCOUNTER — Ambulatory Visit (INDEPENDENT_AMBULATORY_CARE_PROVIDER_SITE_OTHER): Payer: Medicare Other | Admitting: Vascular Surgery

## 2011-10-14 VITALS — BP 177/72 | HR 73 | Resp 16 | Ht 69.0 in | Wt 159.5 lb

## 2011-10-14 DIAGNOSIS — I714 Abdominal aortic aneurysm, without rupture: Secondary | ICD-10-CM

## 2011-10-14 MED ORDER — IOHEXOL 350 MG/ML SOLN
100.0000 mL | Freq: Once | INTRAVENOUS | Status: AC | PRN
  Administered 2011-10-14: 100 mL via INTRAVENOUS

## 2011-10-14 NOTE — Progress Notes (Signed)
The patient presents today for followup of his stent graft repair of the common iliac artery aneurysm in April of 2012. My last visit with him he has had back surgery for lumbar disc disease. He is recovering nicely from this reports that he no longer has a numbness that he used to have in his lower extremities. He has no symptoms careful to his aneurysm.  Past Medical History  Diagnosis Date  . Hypertension   . Hyperlipidemia   . Arthritis   . GERD (gastroesophageal reflux disease)   . Anemia   . Hiatal hernia   . Trouble swallowing   . AAA (abdominal aortic aneurysm)   . Macular degeneration   . Peripheral vascular disease   . CAD (coronary artery disease)     History  Substance Use Topics  . Smoking status: Former Smoker    Types: Cigarettes    Quit date: 12/08/1990  . Smokeless tobacco: Not on file  . Alcohol Use: No    Family History  Problem Relation Age of Onset  . Heart failure Mother   . Pneumonia Father     Allergies  Allergen Reactions  . Hydrocodone Nausea And Vomiting    Current outpatient prescriptions:acetaminophen (TYLENOL) 500 MG tablet, Take 500 mg by mouth every 6 (six) hours as needed.  , Disp: , Rfl: ;  alfuzosin (UROXATRAL) 10 MG 24 hr tablet, Take 10 mg by mouth daily.  , Disp: , Rfl: ;  amLODipine (NORVASC) 5 MG tablet, Take 5 mg by mouth daily.  , Disp: , Rfl: ;  aspirin EC 81 MG tablet, Take 81 mg by mouth daily.  , Disp: , Rfl:  atorvastatin (LIPITOR) 20 MG tablet, Take 20 mg by mouth daily.  , Disp: , Rfl: ;  clopidogrel (PLAVIX) 75 MG tablet, Take 75 mg by mouth daily.  , Disp: , Rfl: ;  Ferrous Sulfate (IRON CR PO), Take by mouth daily.  , Disp: , Rfl: ;  finasteride (PROSCAR) 5 MG tablet, Take 5 mg by mouth daily.  , Disp: , Rfl: ;  fish oil-omega-3 fatty acids 1000 MG capsule, Take 2 g by mouth daily.  , Disp: , Rfl:  lansoprazole (PREVACID) 30 MG capsule, Take 30 mg by mouth daily.  , Disp: , Rfl: ;  METOPROLOL SUCCINATE PO, Take 12.5 mg by  mouth 2 (two) times daily.  , Disp: , Rfl: ;  Multiple Vitamin (MULTIVITAMIN PO), Take by mouth daily.  , Disp: , Rfl: ;  Multiple Vitamins-Minerals (ICAPS MV PO), Take by mouth.  , Disp: , Rfl: ;  Polyethylene Glycol 3350 (MIRALAX PO), Take by mouth.  , Disp: , Rfl:  Pregabalin (LYRICA PO), Take by mouth at bedtime.  , Disp: , Rfl: ;  Psyllium (METAMUCIL PO), Take by mouth.  , Disp: , Rfl:  No current facility-administered medications for this visit. Facility-Administered Medications Ordered in Other Visits: iohexol (OMNIPAQUE) 350 MG/ML injection 100 mL, 100 mL, Intravenous, Once PRN, Medication Radiologist, 100 mL at 10/14/11 1151  BP 177/72  Pulse 73  Resp 16  Ht 5\' 9"  (1.753 m)  Wt 159 lb 8 oz (72.349 kg)  BMI 23.55 kg/m2  Body mass index is 23.55 kg/(m^2).       Physical exam well-developed well-nourished white male appearing younger than stated age of 62 area in 2+ femoral and 2+ to [place] pedis pulses bilaterally. He does have a back brace on therefore it abdomen is not examined. He is grossly intact neurologically.  CT  scan followup: Stable type II endoleak with continued shrinking size of his right common iliac artery aneurysm now maximal diameter 3.7 cm.  Impression and plan: Stable repair of right common iliac artery aneurysm. The patient be seen again in one year with repeat CT scan for continued followup

## 2011-10-14 NOTE — Progress Notes (Signed)
Addended by: Sharee Pimple on: 10/14/2011 04:53 PM   Modules accepted: Orders

## 2011-10-15 NOTE — Progress Notes (Signed)
Addended by: Sharee Pimple on: 10/15/2011 10:15 AM   Modules accepted: Orders

## 2011-11-07 IMAGING — US US SCROTUM
1 series · 14 of 25 positions shown · non-contrast
Comparison: None available.

CLINICAL DATA: [AGE] male with right scrotal pain and
swelling.

SCROTAL ULTRASOUND
DOPPLER ULTRASOUND OF THE TESTICLES
TECHNIQUE: Complete ultrasound examination of the testicles,
epididymis, and other scrotal structures was performed.  Color and
spectral Doppler ultrasound were also utilized to evaluate blood
flow to the testicles.

[Series 1: us scrotum · 0.07mm/px · 14 of 56 slices shown]
[im 1/56]
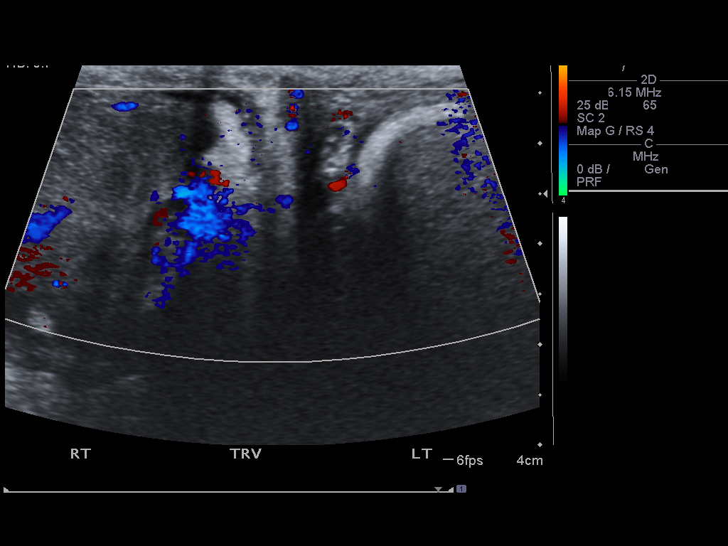
[im 5/56]
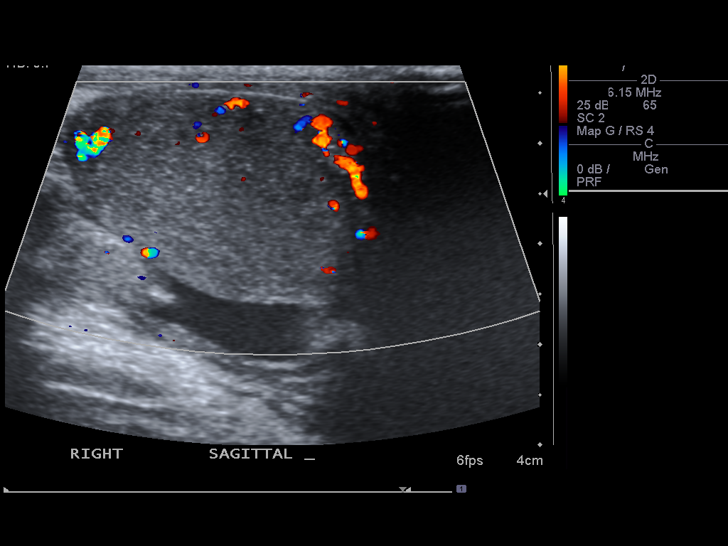
[im 10/56]
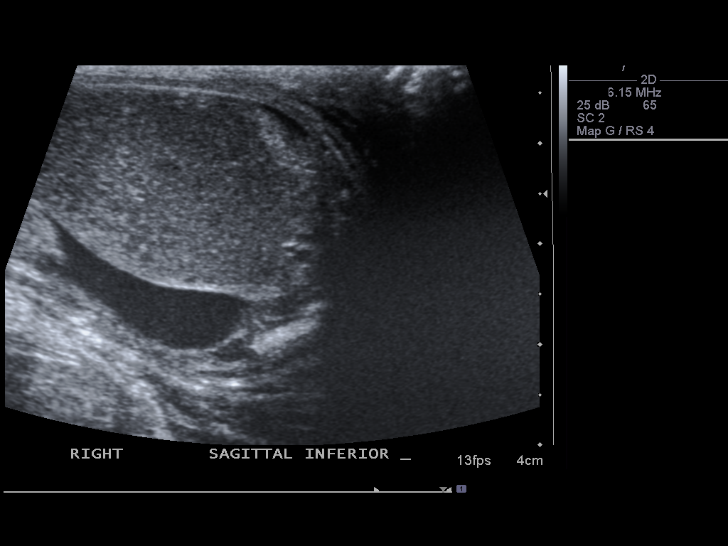
[im 14/56]
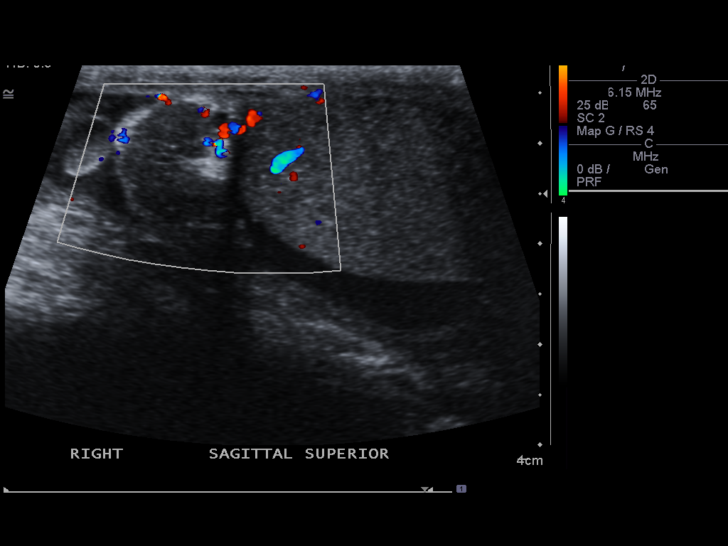
[im 19/56]
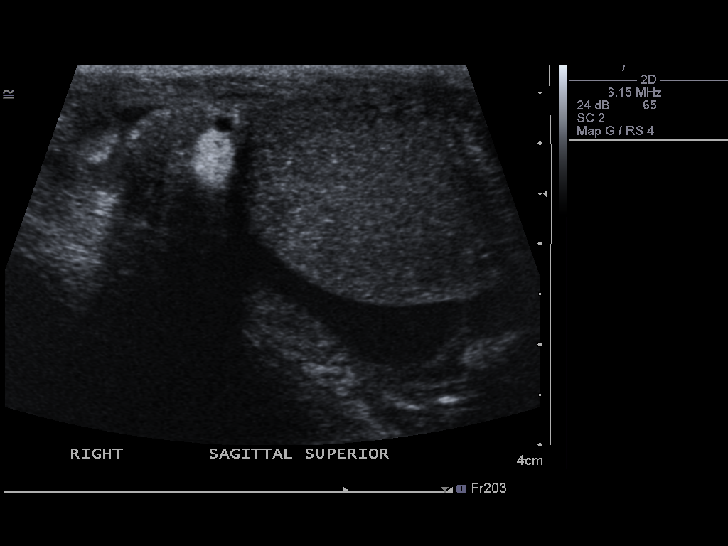
[im 21/56]
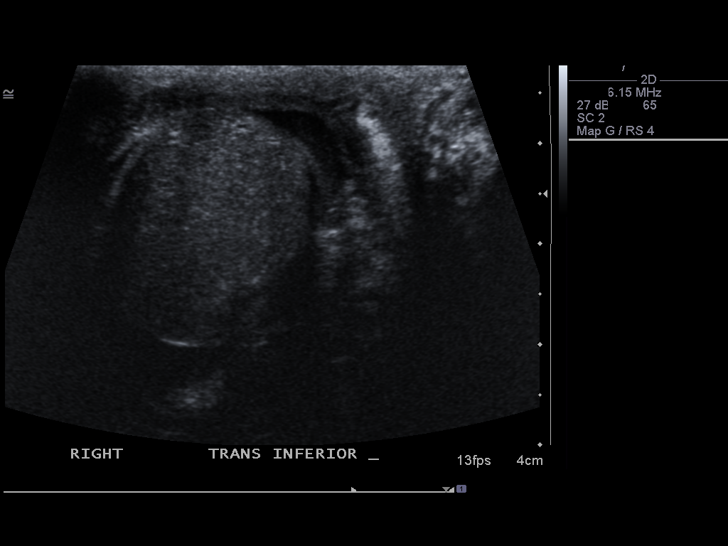
[im 26/56]
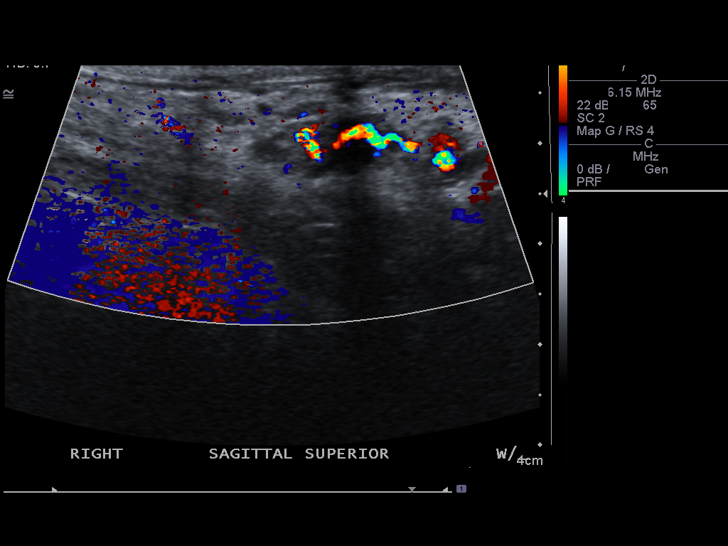
[im 30/56]
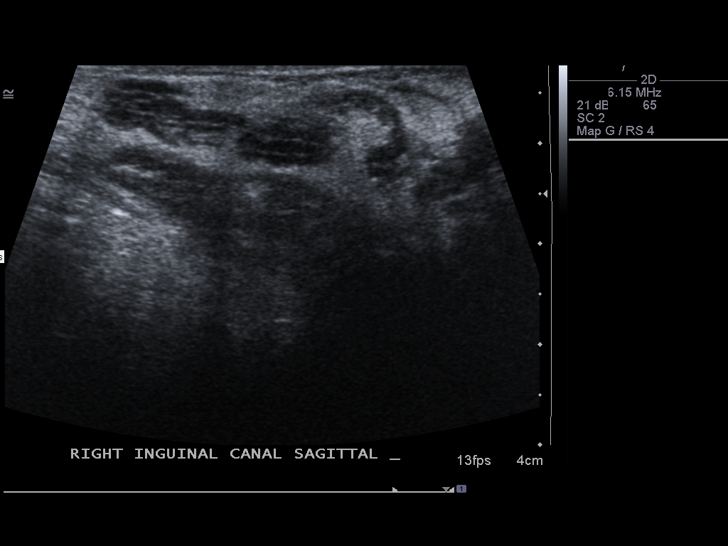
[im 35/56]
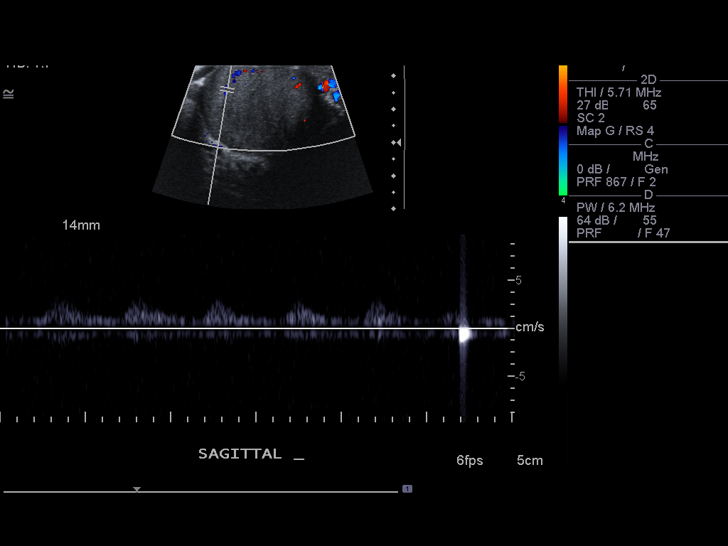
[im 37/56]
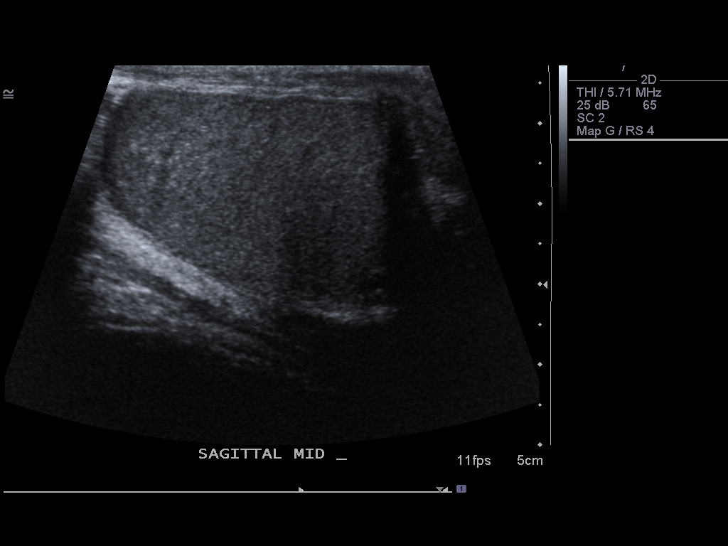
[im 42/56]
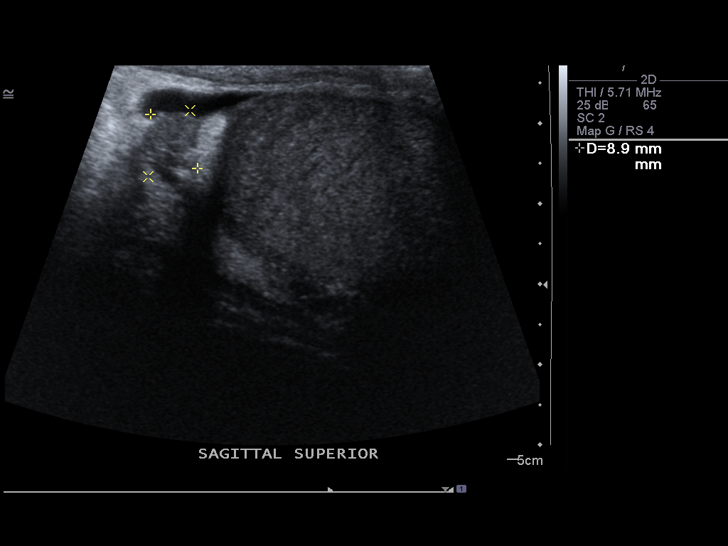
[im 46/56]
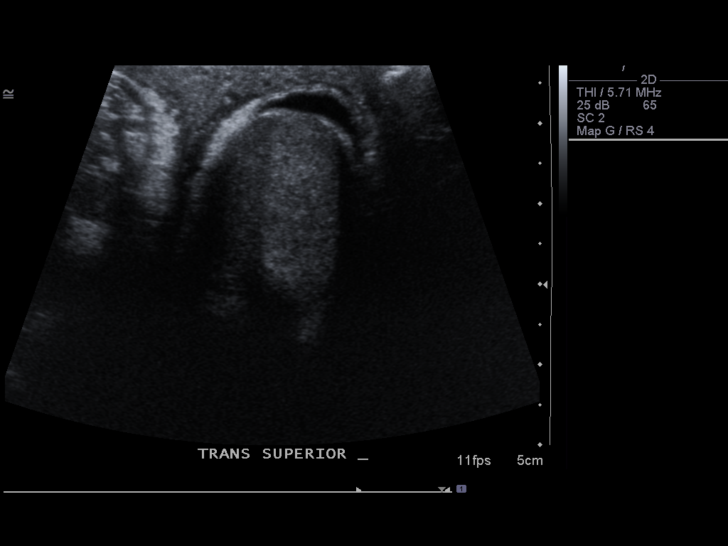
[im 51/56]
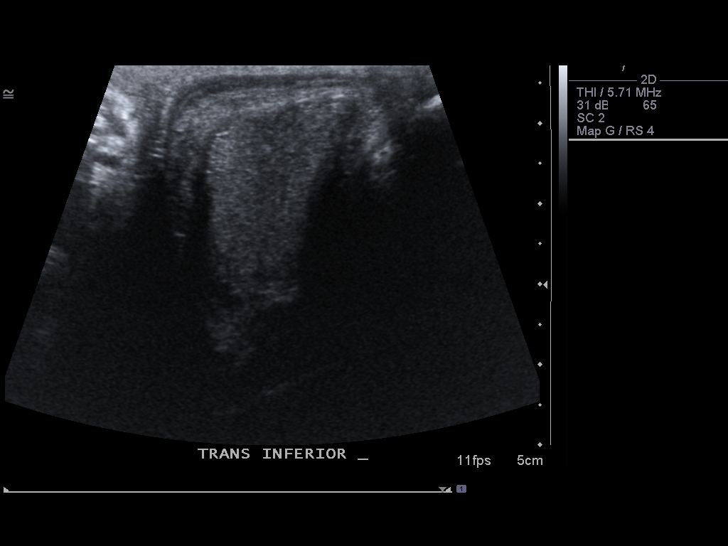
[im 56/56]
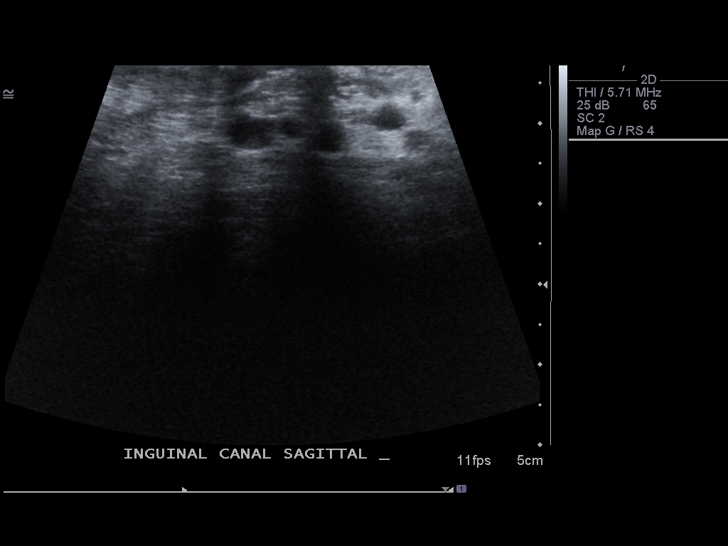

[14 of 25 positions shown; findings below may reference images not displayed]

FINDINGS: The testicles bilaterally are normal in size and
echogenicity.
There is no evidence of focal testicular masses.
Normal symmetric color Doppler flow and arterial/venous waveforms
within the testicles are noted.

A 3 mm right epididymal cyst/spermatocele is noted.
The left epididymis is unremarkable.

There is no evidence of hydrocele or varicocele.

A probable right inguinal hernia containing fat is noted.
IMPRESSION: Normal testicles - no evidence of testicular torsion.

Probable right inguinal hernia containing fat.

## 2012-10-08 ENCOUNTER — Other Ambulatory Visit: Payer: Self-pay | Admitting: Vascular Surgery

## 2012-10-11 ENCOUNTER — Encounter: Payer: Self-pay | Admitting: Vascular Surgery

## 2012-10-12 ENCOUNTER — Ambulatory Visit
Admission: RE | Admit: 2012-10-12 | Discharge: 2012-10-12 | Disposition: A | Discharge status: Home / Self Care | Payer: Medicare Other | Source: Ambulatory Visit | Attending: Vascular Surgery | Admitting: Vascular Surgery

## 2012-10-12 ENCOUNTER — Encounter: Payer: Self-pay | Admitting: Vascular Surgery

## 2012-10-12 ENCOUNTER — Ambulatory Visit (INDEPENDENT_AMBULATORY_CARE_PROVIDER_SITE_OTHER): Payer: Medicare Other | Admitting: Vascular Surgery

## 2012-10-12 VITALS — BP 138/59 | HR 72 | Ht 69.0 in | Wt 161.5 lb

## 2012-10-12 DIAGNOSIS — Z48812 Encounter for surgical aftercare following surgery on the circulatory system: Secondary | ICD-10-CM

## 2012-10-12 DIAGNOSIS — I714 Abdominal aortic aneurysm, without rupture: Secondary | ICD-10-CM

## 2012-10-12 DIAGNOSIS — I723 Aneurysm of iliac artery: Secondary | ICD-10-CM

## 2012-10-12 MED ORDER — IOHEXOL 350 MG/ML SOLN
100.0000 mL | Freq: Once | INTRAVENOUS | Status: AC | PRN
  Administered 2012-10-12: 100 mL via INTRAVENOUS

## 2012-10-12 NOTE — Addendum Note (Signed)
Addended by: Sharee Pimple on: 10/12/2012 11:42 AM   Modules accepted: Orders

## 2012-10-12 NOTE — Progress Notes (Signed)
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 He looks quite good and is his usual animated itself. He is here today with his daughter. He reports some night cramps in his calves but otherwise is doing well. He did have a disc treatment and has recovered nicely from this as well. He is stable from a cardiac standpoint. He has no symptoms referable to his aneurysm.  Past Medical History  Diagnosis Date  . Hypertension   . Hyperlipidemia   . Arthritis   . GERD (gastroesophageal reflux disease)   . Anemia   . Hiatal hernia   . Trouble swallowing   . AAA (abdominal aortic aneurysm)   . Macular degeneration   . Peripheral vascular disease   . CAD (coronary artery disease)     History  Substance Use Topics  . Smoking status: Former Smoker    Types: Cigarettes    Quit date: 12/08/1990  . Smokeless tobacco: Current User    Types: Chew  . Alcohol Use: No    Family History  Problem Relation Age of Onset  . Heart failure Mother   . Diabetes Mother   . Heart disease Mother   . Pneumonia Father   . Cancer Brother   . Diabetes Brother   . Heart disease Brother     Allergies  Allergen Reactions  . Hydrocodone Nausea And Vomiting    Current outpatient prescriptions:acetaminophen (TYLENOL) 500 MG tablet, Take 500 mg by mouth every 6 (six) hours as needed.  , Disp: , Rfl: ;  alfuzosin (UROXATRAL) 10 MG 24 hr tablet, Take 10 mg by mouth daily.  , Disp: , Rfl: ;  amLODipine (NORVASC) 5 MG tablet, Take 5 mg by mouth daily.  , Disp: , Rfl: ;  aspirin EC 81 MG tablet, Take 81 mg by mouth daily.  , Disp: , Rfl:  atorvastatin (LIPITOR) 20 MG tablet, Take 20 mg by mouth daily.  , Disp: , Rfl: ;  cetirizine (ZYRTEC) 5 MG tablet, Take 5 mg by mouth as needed., Disp: , Rfl: ;  clopidogrel (PLAVIX) 75 MG tablet, Take 75 mg by mouth daily.  , Disp: , Rfl: ;  Ferrous Sulfate (IRON CR PO), Take by mouth daily.  , Disp: , Rfl: ;  finasteride (PROSCAR) 5 MG  tablet, Take 5 mg by mouth daily.  , Disp: , Rfl:  fish oil-omega-3 fatty acids 1000 MG capsule, Take 2 g by mouth daily.  , Disp: , Rfl: ;  lansoprazole (PREVACID) 30 MG capsule, Take 30 mg by mouth daily.  , Disp: , Rfl: ;  METOPROLOL SUCCINATE PO, Take 12.5 mg by mouth 2 (two) times daily.  , Disp: , Rfl: ;  Multiple Vitamin (MULTIVITAMIN PO), Take by mouth daily.  , Disp: , Rfl: ;  Multiple Vitamins-Minerals (ICAPS MV PO), Take by mouth.  , Disp: , Rfl:  Polyethylene Glycol 3350 (MIRALAX PO), Take by mouth.  , Disp: , Rfl: ;  Pregabalin (LYRICA PO), Take by mouth at bedtime.  , Disp: , Rfl: ;  Psyllium (METAMUCIL PO), Take by mouth.  , Disp: , Rfl:  No current facility-administered medications for this visit. Facility-Administered Medications Ordered in Other Visits: [COMPLETED] iohexol (OMNIPAQUE) 350 MG/ML injection 100 mL, 100 mL, Intravenous, Once PRN, Medication Radiologist, MD, 100 mL at 10/12/12 0849  BP 138/59  Pulse 72  Ht 5\' 9"  (1.753 m)  Wt 161 lb 8 oz (73.256 kg)  BMI 23.85 kg/m2  SpO2  100%  Body mass index is 23.85 kg/(m^2).       Physical exam: Well-developed white male appearing younger than his age of 41. Abdomen soft nontender no palpable aneurysm 2+ femoral pulses bilaterally without evidence of false aneurysm Grossly intact neurologically  CT scan today of his abdomen and pelvis reveals excellent positioning of the stent graft with no evidence of endoleak or large amount of his iliac artery aneurysm native sac.  Impression and plan stable followup of stent graft repair abdominal and iliac aneurysms. We'll drop back to every 2 years CT scan for continued followup. The patient will notify us if he does any difficulty.

## 2012-11-12 ENCOUNTER — Emergency Department (HOSPITAL_COMMUNITY): Payer: Medicare Other

## 2012-11-12 ENCOUNTER — Encounter (HOSPITAL_COMMUNITY): Payer: Self-pay | Admitting: *Deleted

## 2012-11-12 ENCOUNTER — Emergency Department (HOSPITAL_COMMUNITY)
Admission: EM | Admit: 2012-11-12 | Discharge: 2012-11-12 | Disposition: A | Discharge status: Home / Self Care | Payer: Medicare Other | Attending: Emergency Medicine | Admitting: Emergency Medicine

## 2012-11-12 DIAGNOSIS — W1789XA Other fall from one level to another, initial encounter: Secondary | ICD-10-CM | POA: Insufficient documentation

## 2012-11-12 DIAGNOSIS — Z8739 Personal history of other diseases of the musculoskeletal system and connective tissue: Secondary | ICD-10-CM | POA: Insufficient documentation

## 2012-11-12 DIAGNOSIS — I739 Peripheral vascular disease, unspecified: Secondary | ICD-10-CM | POA: Insufficient documentation

## 2012-11-12 DIAGNOSIS — I2510 Atherosclerotic heart disease of native coronary artery without angina pectoris: Secondary | ICD-10-CM | POA: Insufficient documentation

## 2012-11-12 DIAGNOSIS — S20219A Contusion of unspecified front wall of thorax, initial encounter: Secondary | ICD-10-CM | POA: Insufficient documentation

## 2012-11-12 DIAGNOSIS — I10 Essential (primary) hypertension: Secondary | ICD-10-CM | POA: Insufficient documentation

## 2012-11-12 DIAGNOSIS — Z79899 Other long term (current) drug therapy: Secondary | ICD-10-CM | POA: Insufficient documentation

## 2012-11-12 DIAGNOSIS — Y939 Activity, unspecified: Secondary | ICD-10-CM | POA: Insufficient documentation

## 2012-11-12 DIAGNOSIS — I714 Abdominal aortic aneurysm, without rupture, unspecified: Secondary | ICD-10-CM | POA: Insufficient documentation

## 2012-11-12 DIAGNOSIS — K219 Gastro-esophageal reflux disease without esophagitis: Secondary | ICD-10-CM | POA: Insufficient documentation

## 2012-11-12 DIAGNOSIS — S51819A Laceration without foreign body of unspecified forearm, initial encounter: Secondary | ICD-10-CM

## 2012-11-12 DIAGNOSIS — Z87891 Personal history of nicotine dependence: Secondary | ICD-10-CM | POA: Insufficient documentation

## 2012-11-12 DIAGNOSIS — H3530 Unspecified macular degeneration: Secondary | ICD-10-CM | POA: Insufficient documentation

## 2012-11-12 DIAGNOSIS — Z9889 Other specified postprocedural states: Secondary | ICD-10-CM | POA: Insufficient documentation

## 2012-11-12 DIAGNOSIS — Z862 Personal history of diseases of the blood and blood-forming organs and certain disorders involving the immune mechanism: Secondary | ICD-10-CM | POA: Insufficient documentation

## 2012-11-12 DIAGNOSIS — Y929 Unspecified place or not applicable: Secondary | ICD-10-CM | POA: Insufficient documentation

## 2012-11-12 DIAGNOSIS — Z7982 Long term (current) use of aspirin: Secondary | ICD-10-CM | POA: Insufficient documentation

## 2012-11-12 DIAGNOSIS — Z95818 Presence of other cardiac implants and grafts: Secondary | ICD-10-CM | POA: Insufficient documentation

## 2012-11-12 DIAGNOSIS — W19XXXA Unspecified fall, initial encounter: Secondary | ICD-10-CM

## 2012-11-12 DIAGNOSIS — E785 Hyperlipidemia, unspecified: Secondary | ICD-10-CM | POA: Insufficient documentation

## 2012-11-12 MED ORDER — "THROMBI-PAD 3""X3"" EX PADS"
MEDICATED_PAD | CUTANEOUS | Status: AC
  Filled 2012-11-12: qty 1

## 2012-11-12 MED ORDER — "THROMBI-PAD 3""X3"" EX PADS"
1.0000 | MEDICATED_PAD | Freq: Once | CUTANEOUS | Status: AC
  Administered 2012-11-12: 1 via TOPICAL

## 2012-11-12 NOTE — ED Notes (Signed)
Pt states hit chest when fell also. Bruise noted to the left breast.

## 2012-11-12 NOTE — ED Notes (Signed)
Pt alert & oriented x4. Patient given discharge instructions, paperwork & prescription(s). Patient instructed to stop at the registration desk to finish any additional paperwork. Patient verbalized understanding. Pt left department w/ no further questions. 

## 2012-11-12 NOTE — ED Notes (Signed)
Pt fell & cut arm on a lift.

## 2012-11-12 NOTE — ED Provider Notes (Signed)
History     CSN: [id number]  Arrival date & time 11/12/12  0054   First MD Initiated Contact with Patient 11/12/12 0107      Chief Complaint  Patient presents with  . Extremity Laceration    (Consider location/radiation/quality/duration/timing/severity/associated sxs/prior treatment) HPI.... status post accidental fall tonight. Struck left chest wall and left forearm.  Now with skin tear on the forearm. No head or neck injury. Palpation makes symptoms worse. Severity is mild to moderate. Pain is sharp  Past Medical History  Diagnosis Date  . Hypertension   . Hyperlipidemia   . Arthritis   . GERD (gastroesophageal reflux disease)   . Anemia   . Hiatal hernia   . Trouble swallowing   . AAA (abdominal aortic aneurysm)   . Macular degeneration   . Peripheral vascular disease   . CAD (coronary artery disease)     Past Surgical History  Procedure Date  . Tonsillectomy   . Coronary angioplasty with stent placement   . Back surgery 09-05-2011    . Angioplasty / stenting iliac 03-2011    Right Iliac - Gore Stent graft     Family History  Problem Relation Age of Onset  . Heart failure Mother   . Diabetes Mother   . Heart disease Mother   . Pneumonia Father   . Cancer Brother   . Diabetes Brother   . Heart disease Brother     History  Substance Use Topics  . Smoking status: Former Smoker    Types: Cigarettes    Quit date: 12/08/1990  . Smokeless tobacco: Current User    Types: Chew  . Alcohol Use: No      Review of Systems  All other systems reviewed and are negative.    Allergies  Hydrocodone  Home Medications   Current Outpatient Rx  Name  Route  Sig  Dispense  Refill  . ACETAMINOPHEN 500 MG PO TABS   Oral   Take 500 mg by mouth every 6 (six) hours as needed.           Marland Kitchen ALFUZOSIN HCL ER 10 MG PO TB24   Oral   Take 10 mg by mouth daily.           Marland Kitchen AMLODIPINE BESYLATE 5 MG PO TABS   Oral   Take 5 mg by mouth daily.           .  ASPIRIN EC 81 MG PO TBEC   Oral   Take 81 mg by mouth daily.           . ATORVASTATIN CALCIUM 20 MG PO TABS   Oral   Take 20 mg by mouth daily.           Marland Kitchen CETIRIZINE HCL 5 MG PO TABS   Oral   Take 5 mg by mouth as needed.         . CLOPIDOGREL BISULFATE 75 MG PO TABS   Oral   Take 75 mg by mouth daily.           . IRON CR PO   Oral   Take by mouth daily.           Marland Kitchen FINASTERIDE 5 MG PO TABS   Oral   Take 5 mg by mouth daily.           . OMEGA-3 FATTY ACIDS 1000 MG PO CAPS   Oral   Take 2 g by mouth daily.           Marland Kitchen  LANSOPRAZOLE 30 MG PO CPDR   Oral   Take 30 mg by mouth daily.           Marland Kitchen METOPROLOL SUCCINATE PO   Oral   Take 12.5 mg by mouth 2 (two) times daily.           . MULTIVITAMIN PO   Oral   Take by mouth daily.           . ICAPS MV PO   Oral   Take by mouth.           Marland Kitchen MIRALAX PO   Oral   Take by mouth.           Marland Kitchen LYRICA PO   Oral   Take by mouth at bedtime.           Marland Kitchen METAMUCIL PO   Oral   Take by mouth.             BP 163/68  Pulse 74  Temp 98.1 F (36.7 C) (Oral)  Resp 20  Ht 5' 9.5" (1.765 m)  Wt 157 lb (71.215 kg)  BMI 22.85 kg/m2  SpO2 94%  Physical Exam  Nursing note and vitals reviewed. Constitutional: He is oriented to person, place, and time. He appears well-developed and well-nourished.  HENT:  Head: Normocephalic and atraumatic.  Eyes: Conjunctivae normal and EOM are normal. Pupils are equal, round, and reactive to light.  Neck: Normal range of motion. Neck supple.  Cardiovascular: Normal rate, regular rhythm and normal heart sounds.   Pulmonary/Chest: Effort normal and breath sounds normal.       Slight tenderness left anterior lateral mid chest  Abdominal: Soft. Bowel sounds are normal.  Musculoskeletal: Normal range of motion.  Neurological: He is alert and oriented to person, place, and time.  Skin: Skin is warm and dry.       Left forearm:  5 cm oblique skin tear on the dorsal  aspect of the forearm.  Psychiatric: He has a normal mood and affect.    ED Course  Procedures (including critical care time)  Labs Reviewed - No data to display Dg Forearm Left  11/12/2012  *RADIOLOGY REPORT*  Clinical Data: Status post fall in bathroom; laceration to the left posterior forearm.  LEFT FOREARM - 2 VIEW  Comparison: None.  Findings: There is no evidence of fracture or dislocation.  The radius and ulna appear intact.  There is chronic loss of the radiocarpal joint space, with bony remodelling secondary to impression of the scaphoid on the distal radius.  There is diffuse loss of joint spaces at the carpal rows.  The known soft tissue laceration is difficult to fully characterize, though scattered soft tissue air is noted along the proximal forearm.  The elbow joint is grossly unremarkable appearance; no elbow joint effusion is identified.  An apparent small 4 mm metallic density within the soft tissues of the mid left thumb may reflect prior injury, though clinical correlation is suggested.  IMPRESSION:  1.  No evidence of fracture or dislocation. 2.  Apparent small 4 mm metallic density within the soft tissues of the mid left thumb may reflect prior injury, though clinical correlation is suggested for an associated laceration. 3.  Significant degenerative change noted at the radiocarpal joint and carpal rows, with chronic impression of the scaphoid on the distal radius.   Original Report Authenticated By: Tonia Ghent, M.D.    Dg Chest Portable 1 View  11/12/2012  *RADIOLOGY REPORT*  Clinical  Data: Fall.  Chest injury and left-sided chest pain.  PORTABLE CHEST - 1 VIEW  Comparison: 03/10/2011  Findings: Lordotic positioning noted.  No evidence of pneumothorax or hemothorax.  Both lungs are clear.  Heart size is within normal limits.  No evidence of mediastinal widening or tracheal deviation. No displaced rib fractures are identified.  IMPRESSION: No active disease.   Original Report  Authenticated By: Myles Rosenthal, M.D.      1. Fall   2. Chest wall contusion   3. Skin tear of forearm without complication       MDM  Chest x-ray shows no fracture. Skin tear in the form requires no suturing.  Will apply thrombin and a bulky dressing        Donnetta Hutching, MD 11/12/12 [id number]

## 2012-11-15 ENCOUNTER — Emergency Department (HOSPITAL_COMMUNITY)
Admission: EM | Admit: 2012-11-15 | Discharge: 2012-11-15 | Disposition: A | Discharge status: Home / Self Care | Payer: Medicare Other | Attending: Emergency Medicine | Admitting: Emergency Medicine

## 2012-11-15 ENCOUNTER — Encounter (HOSPITAL_COMMUNITY): Payer: Self-pay | Admitting: *Deleted

## 2012-11-15 DIAGNOSIS — I2510 Atherosclerotic heart disease of native coronary artery without angina pectoris: Secondary | ICD-10-CM | POA: Insufficient documentation

## 2012-11-15 DIAGNOSIS — Z8739 Personal history of other diseases of the musculoskeletal system and connective tissue: Secondary | ICD-10-CM | POA: Insufficient documentation

## 2012-11-15 DIAGNOSIS — E785 Hyperlipidemia, unspecified: Secondary | ICD-10-CM | POA: Insufficient documentation

## 2012-11-15 DIAGNOSIS — Z8719 Personal history of other diseases of the digestive system: Secondary | ICD-10-CM | POA: Insufficient documentation

## 2012-11-15 DIAGNOSIS — D649 Anemia, unspecified: Secondary | ICD-10-CM | POA: Insufficient documentation

## 2012-11-15 DIAGNOSIS — Z95818 Presence of other cardiac implants and grafts: Secondary | ICD-10-CM | POA: Insufficient documentation

## 2012-11-15 DIAGNOSIS — Z7982 Long term (current) use of aspirin: Secondary | ICD-10-CM | POA: Insufficient documentation

## 2012-11-15 DIAGNOSIS — Z9889 Other specified postprocedural states: Secondary | ICD-10-CM | POA: Insufficient documentation

## 2012-11-15 DIAGNOSIS — H3530 Unspecified macular degeneration: Secondary | ICD-10-CM | POA: Insufficient documentation

## 2012-11-15 DIAGNOSIS — Z8679 Personal history of other diseases of the circulatory system: Secondary | ICD-10-CM | POA: Insufficient documentation

## 2012-11-15 DIAGNOSIS — Z79899 Other long term (current) drug therapy: Secondary | ICD-10-CM | POA: Insufficient documentation

## 2012-11-15 DIAGNOSIS — K219 Gastro-esophageal reflux disease without esophagitis: Secondary | ICD-10-CM | POA: Insufficient documentation

## 2012-11-15 DIAGNOSIS — R1310 Dysphagia, unspecified: Secondary | ICD-10-CM | POA: Insufficient documentation

## 2012-11-15 DIAGNOSIS — Z4801 Encounter for change or removal of surgical wound dressing: Secondary | ICD-10-CM | POA: Insufficient documentation

## 2012-11-15 DIAGNOSIS — I10 Essential (primary) hypertension: Secondary | ICD-10-CM | POA: Insufficient documentation

## 2012-11-15 DIAGNOSIS — Z87891 Personal history of nicotine dependence: Secondary | ICD-10-CM | POA: Insufficient documentation

## 2012-11-15 DIAGNOSIS — I739 Peripheral vascular disease, unspecified: Secondary | ICD-10-CM | POA: Insufficient documentation

## 2012-11-15 DIAGNOSIS — Z5189 Encounter for other specified aftercare: Secondary | ICD-10-CM

## 2012-11-15 NOTE — ED Provider Notes (Signed)
History   This chart was scribed for Geoffery Lyons, MD by Charolett Bumpers, ED Scribe. The patient was seen in room APA11/APA11. Patient's care was started at 1230.   CSN: [id number]  Arrival date & time 11/15/12  1146   First MD Initiated Contact with Patient 11/15/12 1230      Chief Complaint  Patient presents with  . Wound Check    The history is provided by the patient. No language interpreter was used.   [name] is a 78 y.o. male who presents to the Emergency Department complaining of moderate wound to his left forearm that occurred 3 days ago. He has a skin tear with associated bruising after falling 3 days ago. He states that he fell in the bathroom, landing on his left arm. He was seen here in ED afterwards and was bandaged. He is here for a recheck. He reports improvement in his symptoms. He states that he takes an 81 mg aspirin and Plavix daily.   Past Medical History  Diagnosis Date  . Hypertension   . Hyperlipidemia   . Arthritis   . GERD (gastroesophageal reflux disease)   . Anemia   . Hiatal hernia   . Trouble swallowing   . AAA (abdominal aortic aneurysm)   . Macular degeneration   . Peripheral vascular disease   . CAD (coronary artery disease)     Past Surgical History  Procedure Date  . Tonsillectomy   . Coronary angioplasty with stent placement   . Back surgery 09-05-2011    . Angioplasty / stenting iliac 03-2011    Right Iliac - Gore Stent graft     Family History  Problem Relation Age of Onset  . Heart failure Mother   . Diabetes Mother   . Heart disease Mother   . Pneumonia Father   . Cancer Brother   . Diabetes Brother   . Heart disease Brother     History  Substance Use Topics  . Smoking status: Former Smoker    Types: Cigarettes    Quit date: 12/08/1990  . Smokeless tobacco: Current User    Types: Chew  . Alcohol Use: No      Review of Systems  Skin: Positive for wound.  All other systems reviewed and are  negative.    Allergies  Hydrocodone  Home Medications   Current Outpatient Rx  Name  Route  Sig  Dispense  Refill  . ACETAMINOPHEN 500 MG PO TABS   Oral   Take 500 mg by mouth every 6 (six) hours as needed.           Marland Kitchen ALFUZOSIN HCL ER 10 MG PO TB24   Oral   Take 10 mg by mouth daily.           Marland Kitchen AMLODIPINE BESYLATE 5 MG PO TABS   Oral   Take 5 mg by mouth daily.           . ASPIRIN EC 81 MG PO TBEC   Oral   Take 81 mg by mouth daily.           . ATORVASTATIN CALCIUM 20 MG PO TABS   Oral   Take 20 mg by mouth daily.           Marland Kitchen CETIRIZINE HCL 5 MG PO TABS   Oral   Take 5 mg by mouth as needed.         . CLOPIDOGREL BISULFATE 75 MG PO TABS  Oral   Take 75 mg by mouth daily.           . IRON CR PO   Oral   Take by mouth daily.           Marland Kitchen FINASTERIDE 5 MG PO TABS   Oral   Take 5 mg by mouth daily.           . OMEGA-3 FATTY ACIDS 1000 MG PO CAPS   Oral   Take 2 g by mouth daily.           Marland Kitchen LANSOPRAZOLE 30 MG PO CPDR   Oral   Take 30 mg by mouth daily.           Marland Kitchen METOPROLOL SUCCINATE PO   Oral   Take 12.5 mg by mouth 2 (two) times daily.           . MULTIVITAMIN PO   Oral   Take by mouth daily.           . ICAPS MV PO   Oral   Take by mouth.           Marland Kitchen MIRALAX PO   Oral   Take by mouth.           Marland Kitchen LYRICA PO   Oral   Take by mouth at bedtime.           Marland Kitchen METAMUCIL PO   Oral   Take by mouth.             BP 146/67  Pulse 61  Temp 98 F (36.7 C) (Oral)  Resp 20  Ht 5\' 10"  (1.778 m)  Wt 165 lb (74.844 kg)  BMI 23.68 kg/m2  SpO2 100%  Physical Exam  Nursing note and vitals reviewed. Constitutional: He is oriented to person, place, and time. He appears well-developed and well-nourished. No distress.  HENT:  Head: Normocephalic and atraumatic.  Eyes: EOM are normal. Pupils are equal, round, and reactive to light.  Neck: Normal range of motion. Neck supple. No tracheal deviation present.   Cardiovascular: Normal rate.   Pulmonary/Chest: Effort normal. No respiratory distress.  Musculoskeletal: Normal range of motion. He exhibits no edema.  Neurological: He is alert and oriented to person, place, and time.  Skin: Skin is warm and dry.       Dorsal aspect of left mid forearm has 2 skin tears with underlying hematoma. Both appear to be healing well. No erythema noted. Distal pulses and sensation intact.   Psychiatric: He has a normal mood and affect. His behavior is normal.    ED Course  Procedures (including critical care time)  DIAGNOSTIC STUDIES: Oxygen Saturation is 100% on room air, normal by my interpretation.    COORDINATION OF CARE:  12:35-Discussed planned course of treatment with the patient including re-dressing the wound with bacitracin, who is agreeable at this time.    Labs Reviewed - No data to display No results found.   No diagnosis found.    MDM  Patient here for recheck of his injured forearm.  The skin tears appear to be healing well and there is no evidence of infection.  Will continue dressing changes, follow up prn is worsens.    I personally performed the services described in this documentation, which was scribed in my presence. The recorded information has been reviewed and is accurate.       Geoffery Lyons, MD 11/15/12 1536

## 2012-11-15 NOTE — ED Notes (Signed)
Pt here for recheck of lt forearm  Injury,  Pt had fallen and injured his arm. Removed the bandage yesterday.

## 2012-12-12 ENCOUNTER — Emergency Department (HOSPITAL_COMMUNITY)
Admission: EM | Admit: 2012-12-12 | Discharge: 2012-12-12 | Disposition: A | Discharge status: Home / Self Care | Payer: Medicare Other | Attending: Emergency Medicine | Admitting: Emergency Medicine

## 2012-12-12 ENCOUNTER — Encounter (HOSPITAL_COMMUNITY): Payer: Self-pay | Admitting: *Deleted

## 2012-12-12 ENCOUNTER — Emergency Department (HOSPITAL_COMMUNITY): Payer: Medicare Other

## 2012-12-12 DIAGNOSIS — Z87891 Personal history of nicotine dependence: Secondary | ICD-10-CM | POA: Insufficient documentation

## 2012-12-12 DIAGNOSIS — I739 Peripheral vascular disease, unspecified: Secondary | ICD-10-CM | POA: Insufficient documentation

## 2012-12-12 DIAGNOSIS — K219 Gastro-esophageal reflux disease without esophagitis: Secondary | ICD-10-CM | POA: Insufficient documentation

## 2012-12-12 DIAGNOSIS — Z79899 Other long term (current) drug therapy: Secondary | ICD-10-CM | POA: Insufficient documentation

## 2012-12-12 DIAGNOSIS — Z8679 Personal history of other diseases of the circulatory system: Secondary | ICD-10-CM | POA: Insufficient documentation

## 2012-12-12 DIAGNOSIS — I10 Essential (primary) hypertension: Secondary | ICD-10-CM | POA: Insufficient documentation

## 2012-12-12 DIAGNOSIS — Z8719 Personal history of other diseases of the digestive system: Secondary | ICD-10-CM | POA: Insufficient documentation

## 2012-12-12 DIAGNOSIS — Z862 Personal history of diseases of the blood and blood-forming organs and certain disorders involving the immune mechanism: Secondary | ICD-10-CM | POA: Insufficient documentation

## 2012-12-12 DIAGNOSIS — I2510 Atherosclerotic heart disease of native coronary artery without angina pectoris: Secondary | ICD-10-CM | POA: Insufficient documentation

## 2012-12-12 DIAGNOSIS — Z8739 Personal history of other diseases of the musculoskeletal system and connective tissue: Secondary | ICD-10-CM | POA: Insufficient documentation

## 2012-12-12 DIAGNOSIS — Z8669 Personal history of other diseases of the nervous system and sense organs: Secondary | ICD-10-CM | POA: Insufficient documentation

## 2012-12-12 DIAGNOSIS — Z7901 Long term (current) use of anticoagulants: Secondary | ICD-10-CM | POA: Insufficient documentation

## 2012-12-12 DIAGNOSIS — M542 Cervicalgia: Secondary | ICD-10-CM | POA: Insufficient documentation

## 2012-12-12 DIAGNOSIS — E785 Hyperlipidemia, unspecified: Secondary | ICD-10-CM | POA: Insufficient documentation

## 2012-12-12 LAB — BASIC METABOLIC PANEL
CO2: 27 mEq/L (ref 19–32)
Calcium: 9.2 mg/dL (ref 8.4–10.5)
Creatinine, Ser: 0.88 mg/dL (ref 0.50–1.35)
GFR calc non Af Amer: 72 mL/min — ABNORMAL LOW (ref >90)
Glucose, Bld: 95 mg/dL (ref 70–99)
Sodium: 126 mEq/L — ABNORMAL LOW (ref 135–145)

## 2012-12-12 LAB — CBC WITH DIFFERENTIAL/PLATELET
Basophils Absolute: 0 10*3/uL (ref 0.0–0.1)
Eosinophils Absolute: 0.1 10*3/uL (ref 0.0–0.7)
Eosinophils Relative: 1 % (ref 0–5)
HCT: 36.1 % — ABNORMAL LOW (ref 39.0–52.0)
Lymphocytes Relative: 20 % (ref 12–46)
Lymphs Abs: 1.1 10*3/uL (ref 0.7–4.0)
MCH: 30.8 pg (ref 26.0–34.0)
MCV: 91.2 fL (ref 78.0–100.0)
Monocytes Absolute: 0.6 10*3/uL (ref 0.1–1.0)
Platelets: 162 10*3/uL (ref 150–400)
RDW: 14.3 % (ref 11.5–15.5)

## 2012-12-12 MED ORDER — IOHEXOL 300 MG/ML  SOLN
80.0000 mL | Freq: Once | INTRAMUSCULAR | Status: AC | PRN
  Administered 2012-12-12: 80 mL via INTRAVENOUS

## 2012-12-12 NOTE — ED Provider Notes (Signed)
History    Scribed for Jeremy Lennert, MD, the patient was seen in room APA11/APA11. This chart was scribed by Katha Cabal.   CSN: [id number]  Arrival date & time 12/12/12  2130   First MD Initiated Contact with Patient 12/12/12 1017      Chief Complaint  Patient presents with  . Sore Throat    (Consider location/radiation/quality/duration/timing/severity/associated sxs/prior treatment) Patient is a 79 y.o. male presenting with pharyngitis. The history is provided by the patient and a relative. No language interpreter was used.  Sore Throat This is a recurrent problem. The current episode started more than 1 week ago. The problem occurs constantly. The problem has been gradually worsening. Pertinent negatives include no chest pain, no abdominal pain and no headaches. Nothing aggravates the symptoms. Nothing relieves the symptoms. Treatments tried: Zyrtec and inhaler.    Jeremy Lennert, MD entered patient's room at 10:19 AM   [name] is a 79 y.o. male who presents to the Emergency Department complaining of  Breath better while standing upright.  Patient states sx ongoing for a couple months. Reports gradual worsening.   Denies difficulty swallowing.  Taking Zyrtec and inhaler.  Patient states  Left sided throat pain.     Symptoms are associated with   Symptoms are not associated with    Patient with history of Patient with surgical history of  PCP Jeremy German, MD        Past Medical History  Diagnosis Date  . Hypertension   . Hyperlipidemia   . Arthritis   . GERD (gastroesophageal reflux disease)   . Anemia   . Hiatal hernia   . Trouble swallowing   . AAA (abdominal aortic aneurysm)   . Macular degeneration   . Peripheral vascular disease   . CAD (coronary artery disease)     Past Surgical History  Procedure Date  . Tonsillectomy   . Coronary angioplasty with stent placement   . Back surgery 09-05-2011    . Angioplasty / stenting iliac  03-2011    Right Iliac - Gore Stent graft     Family History  Problem Relation Age of Onset  . Heart failure Mother   . Diabetes Mother   . Heart disease Mother   . Pneumonia Father   . Cancer Brother   . Diabetes Brother   . Heart disease Brother     History  Substance Use Topics  . Smoking status: Former Smoker    Types: Cigarettes    Quit date: 12/08/1990  . Smokeless tobacco: Current User    Types: Chew  . Alcohol Use: No      Review of Systems  Constitutional: Negative for fatigue.  HENT: Negative for congestion, sinus pressure and ear discharge.        Neck pain  Eyes: Negative for discharge.  Respiratory: Negative for cough.   Cardiovascular: Negative for chest pain.  Gastrointestinal: Negative for abdominal pain and diarrhea.  Genitourinary: Negative for frequency and hematuria.  Musculoskeletal: Negative for back pain.  Skin: Negative for rash.  Neurological: Negative for seizures and headaches.  Hematological: Negative.   Psychiatric/Behavioral: Negative for hallucinations.    Allergies  Hydrocodone  Home Medications   Current Outpatient Rx  Name  Route  Sig  Dispense  Refill  . ACETAMINOPHEN 500 MG PO TABS   Oral   Take 500 mg by mouth every 6 (six) hours as needed. Pain.         Marland Kitchen ALFUZOSIN HCL  ER 10 MG PO TB24   Oral   Take 10 mg by mouth daily.           Marland Kitchen AMLODIPINE BESYLATE 5 MG PO TABS   Oral   Take 5 mg by mouth daily.           . ASPIRIN EC 81 MG PO TBEC   Oral   Take 81 mg by mouth daily.           . ATORVASTATIN CALCIUM 20 MG PO TABS   Oral   Take 20 mg by mouth daily.           Marland Kitchen CETIRIZINE HCL 5 MG PO TABS   Oral   Take 5 mg by mouth as needed.         . CLOPIDOGREL BISULFATE 75 MG PO TABS   Oral   Take 75 mg by mouth daily.           . IRON CR PO   Oral   Take 1 tablet by mouth daily.          Marland Kitchen FINASTERIDE 5 MG PO TABS   Oral   Take 5 mg by mouth daily.           . OMEGA-3 FATTY ACIDS 1000  MG PO CAPS   Oral   Take 2 g by mouth daily.           Marland Kitchen LANSOPRAZOLE 30 MG PO CPDR   Oral   Take 30 mg by mouth daily.           Marland Kitchen METOPROLOL SUCCINATE PO   Oral   Take 12.5 mg by mouth 2 (two) times daily.           . MULTIVITAMIN PO   Oral   Take 1 tablet by mouth daily.          . ICAPS MV PO   Oral   Take 1 capsule by mouth daily.          Marland Kitchen MIRALAX PO   Oral   Take 17 g by mouth daily as needed. Constipation.         Marland Kitchen LYRICA PO   Oral   Take by mouth at bedtime.           Marland Kitchen METAMUCIL PO   Oral   Take by mouth.             BP 166/111  Pulse 88  Temp 98 F (36.7 C) (Oral)  Resp 16  Ht 5\' 10"  (1.778 m)  Wt 156 lb (70.761 kg)  BMI 22.38 kg/m2  SpO2 100%  Physical Exam  Constitutional: He is oriented to person, place, and time. He appears well-developed.  HENT:  Head: Normocephalic and atraumatic.  Eyes: Conjunctivae normal and EOM are normal. No scleral icterus.  Neck: Neck supple. No thyromegaly present.  Cardiovascular: Normal rate and regular rhythm.  Exam reveals no gallop and no friction rub.   No murmur heard. Pulmonary/Chest: No stridor. He has no wheezes. He has no rales. He exhibits no tenderness.  Abdominal: He exhibits no distension. There is no tenderness. There is no rebound.  Musculoskeletal: Normal range of motion. He exhibits no edema.  Lymphadenopathy:    He has no cervical adenopathy.  Neurological: He is oriented to person, place, and time. Coordination normal.  Skin: No rash noted. No erythema.  Psychiatric: He has a normal mood and affect. His behavior is normal.    ED  Course  Procedures (including critical care time)    DIAGNOSTIC STUDIES: Oxygen Saturation is 100% on room air normal by my interpretation.     COORDINATION OF CARE:    LABS / RADIOLOGY:   Labs Reviewed - No data to display No results found.       MDM             MEDICATIONS GIVEN IN THE E.D. Scheduled Meds:    Continuous Infusions:       IMPRESSION: No diagnosis found.   NEW MEDICATIONS: New Prescriptions   No medications on file      The chart was scribed for me under my direct supervision.  I personally performed the history, physical, and medical decision making and all procedures in the evaluation of this patient.Jeremy Lennert, MD 12/12/12 1329

## 2012-12-12 NOTE — ED Notes (Signed)
Reports sore throat with difficulty breathing intermittently x 3-4 months.  States hard to breath when bending over or leaning backward.

## 2012-12-12 NOTE — ED Notes (Signed)
Old skin tear to left forearm rewrapped per pt request with telfa and kling. NAD. Wound appears to be healing well. No signs of infections noted.

## 2012-12-23 ENCOUNTER — Ambulatory Visit (INDEPENDENT_AMBULATORY_CARE_PROVIDER_SITE_OTHER): Payer: Medicare Other | Admitting: Otolaryngology

## 2012-12-23 DIAGNOSIS — R070 Pain in throat: Secondary | ICD-10-CM

## 2012-12-23 DIAGNOSIS — J310 Chronic rhinitis: Secondary | ICD-10-CM

## 2012-12-23 DIAGNOSIS — R1312 Dysphagia, oropharyngeal phase: Secondary | ICD-10-CM

## 2013-02-03 ENCOUNTER — Ambulatory Visit (INDEPENDENT_AMBULATORY_CARE_PROVIDER_SITE_OTHER): Payer: Medicare Other | Admitting: Otolaryngology

## 2013-02-03 DIAGNOSIS — J0100 Acute maxillary sinusitis, unspecified: Secondary | ICD-10-CM

## 2013-02-03 DIAGNOSIS — R490 Dysphonia: Secondary | ICD-10-CM

## 2013-02-03 DIAGNOSIS — J310 Chronic rhinitis: Secondary | ICD-10-CM

## 2013-02-18 ENCOUNTER — Emergency Department (HOSPITAL_COMMUNITY): Payer: Medicare Other

## 2013-02-18 ENCOUNTER — Encounter (HOSPITAL_COMMUNITY): Payer: Self-pay | Admitting: *Deleted

## 2013-02-18 ENCOUNTER — Inpatient Hospital Stay (HOSPITAL_COMMUNITY)
Admission: EM | Admit: 2013-02-18 | Discharge: 2013-02-21 | DRG: 645 | Disposition: A | Discharge status: Skilled Nursing Facility | Payer: Medicare Other | Attending: Internal Medicine | Admitting: Internal Medicine

## 2013-02-18 DIAGNOSIS — Z8673 Personal history of transient ischemic attack (TIA), and cerebral infarction without residual deficits: Secondary | ICD-10-CM

## 2013-02-18 DIAGNOSIS — N400 Enlarged prostate without lower urinary tract symptoms: Secondary | ICD-10-CM | POA: Diagnosis present

## 2013-02-18 DIAGNOSIS — E871 Hypo-osmolality and hyponatremia: Secondary | ICD-10-CM

## 2013-02-18 DIAGNOSIS — E236 Other disorders of pituitary gland: Principal | ICD-10-CM | POA: Diagnosis present

## 2013-02-18 DIAGNOSIS — E7800 Pure hypercholesterolemia, unspecified: Secondary | ICD-10-CM | POA: Diagnosis present

## 2013-02-18 DIAGNOSIS — Z9861 Coronary angioplasty status: Secondary | ICD-10-CM | POA: Diagnosis present

## 2013-02-18 DIAGNOSIS — I10 Essential (primary) hypertension: Secondary | ICD-10-CM | POA: Diagnosis present

## 2013-02-18 DIAGNOSIS — M129 Arthropathy, unspecified: Secondary | ICD-10-CM | POA: Diagnosis present

## 2013-02-18 DIAGNOSIS — I2510 Atherosclerotic heart disease of native coronary artery without angina pectoris: Secondary | ICD-10-CM | POA: Diagnosis present

## 2013-02-18 LAB — BASIC METABOLIC PANEL
Calcium: 9.3 mg/dL (ref 8.4–10.5)
Calcium: 9.2 mg/dL (ref 8.4–10.5)
Chloride: 89 mEq/L — ABNORMAL LOW (ref 96–112)
Creatinine, Ser: 0.74 mg/dL (ref 0.50–1.35)
GFR calc Af Amer: 88 mL/min — ABNORMAL LOW (ref >90)
GFR calc Af Amer: >90 mL/min (ref >90)
GFR calc non Af Amer: 76 mL/min — ABNORMAL LOW (ref >90)
Potassium: 3.8 mEq/L (ref 3.5–5.1)
Sodium: 125 mEq/L — ABNORMAL LOW (ref 135–145)
Sodium: 126 mEq/L — ABNORMAL LOW (ref 135–145)

## 2013-02-18 LAB — CBC WITH DIFFERENTIAL/PLATELET
Basophils Absolute: 0 10*3/uL (ref 0.0–0.1)
Basophils Relative: 0 % (ref 0–1)
Eosinophils Absolute: 0 10*3/uL (ref 0.0–0.7)
Eosinophils Relative: 1 % (ref 0–5)
MCH: 31 pg (ref 26.0–34.0)
MCV: 86.9 fL (ref 78.0–100.0)
Neutrophils Relative %: 75 % (ref 43–77)
Platelets: 161 10*3/uL (ref 150–400)
RBC: 3.97 MIL/uL — ABNORMAL LOW (ref 4.22–5.81)
RDW: 13.7 % (ref 11.5–15.5)

## 2013-02-18 LAB — SODIUM, URINE, RANDOM: Sodium, Ur: 85 mEq/L

## 2013-02-18 LAB — URINALYSIS, ROUTINE W REFLEX MICROSCOPIC
Hgb urine dipstick: NEGATIVE
Leukocytes, UA: NEGATIVE
Nitrite: NEGATIVE
Protein, ur: NEGATIVE mg/dL
Specific Gravity, Urine: 1.008 (ref 1.005–1.030)
Urobilinogen, UA: 0.2 mg/dL (ref 0.0–1.0)

## 2013-02-18 LAB — OSMOLALITY, URINE: Osmolality, Ur: 239 mOsm/kg — ABNORMAL LOW (ref 390–1090)

## 2013-02-18 MED ORDER — IPRATROPIUM BROMIDE 0.06 % NA SOLN
2.0000 | Freq: Two times a day (BID) | NASAL | Status: DC | PRN
  Filled 2013-02-18: qty 15

## 2013-02-18 MED ORDER — ACETAMINOPHEN 325 MG PO TABS
650.0000 mg | ORAL_TABLET | Freq: Four times a day (QID) | ORAL | Status: DC | PRN
  Administered 2013-02-19 – 2013-02-21 (×5): 650 mg via ORAL
  Filled 2013-02-18 (×6): qty 2

## 2013-02-18 MED ORDER — ATORVASTATIN CALCIUM 20 MG PO TABS
20.0000 mg | ORAL_TABLET | Freq: Every day | ORAL | Status: DC
  Administered 2013-02-19 – 2013-02-21 (×3): 20 mg via ORAL
  Filled 2013-02-18 (×3): qty 1

## 2013-02-18 MED ORDER — ALPRAZOLAM 0.25 MG PO TABS: 0.2500 mg | ORAL_TABLET | Freq: Every evening | ORAL | Status: DC | PRN

## 2013-02-18 MED ORDER — GI COCKTAIL ~~LOC~~: 30.0000 mL | Freq: Once | ORAL | Status: DC

## 2013-02-18 MED ORDER — PANTOPRAZOLE SODIUM 20 MG PO TBEC
20.0000 mg | DELAYED_RELEASE_TABLET | Freq: Every day | ORAL | Status: DC
  Administered 2013-02-19 – 2013-02-21 (×3): 20 mg via ORAL
  Filled 2013-02-18 (×3): qty 1

## 2013-02-18 MED ORDER — SODIUM CHLORIDE 0.9 % IJ SOLN
3.0000 mL | Freq: Two times a day (BID) | INTRAMUSCULAR | Status: DC
  Administered 2013-02-19 – 2013-02-20 (×5): 3 mL via INTRAVENOUS

## 2013-02-18 MED ORDER — ALFUZOSIN HCL ER 10 MG PO TB24
10.0000 mg | ORAL_TABLET | Freq: Every day | ORAL | Status: DC
  Administered 2013-02-19 – 2013-02-21 (×3): 10 mg via ORAL
  Filled 2013-02-18 (×3): qty 1

## 2013-02-18 MED ORDER — MAGIC MOUTHWASH
5.0000 mL | Freq: Four times a day (QID) | ORAL | Status: DC | PRN
  Filled 2013-02-18: qty 5

## 2013-02-18 MED ORDER — FERROUS SULFATE 325 (65 FE) MG PO TABS
325.0000 mg | ORAL_TABLET | Freq: Every day | ORAL | Status: DC
  Administered 2013-02-19 – 2013-02-21 (×3): 325 mg via ORAL
  Filled 2013-02-18 (×5): qty 1

## 2013-02-18 MED ORDER — ACETAMINOPHEN 650 MG RE SUPP: 650.0000 mg | Freq: Four times a day (QID) | RECTAL | Status: DC | PRN

## 2013-02-18 MED ORDER — PANTOPRAZOLE SODIUM 40 MG IV SOLR
40.0000 mg | Freq: Once | INTRAVENOUS | Status: AC
  Administered 2013-02-18: 40 mg via INTRAVENOUS
  Filled 2013-02-18: qty 40

## 2013-02-18 MED ORDER — AMLODIPINE BESYLATE 5 MG PO TABS
5.0000 mg | ORAL_TABLET | Freq: Every day | ORAL | Status: DC
  Administered 2013-02-19: 5 mg via ORAL
  Filled 2013-02-18: qty 1

## 2013-02-18 MED ORDER — POLYETHYLENE GLYCOL 3350 17 G PO PACK
17.0000 g | PACK | Freq: Every day | ORAL | Status: DC | PRN
  Administered 2013-02-19: 17 g via ORAL
  Filled 2013-02-18 (×2): qty 1

## 2013-02-18 MED ORDER — HEPARIN SODIUM (PORCINE) 5000 UNIT/ML IJ SOLN
5000.0000 [IU] | Freq: Three times a day (TID) | INTRAMUSCULAR | Status: DC
  Administered 2013-02-18 – 2013-02-21 (×9): 5000 [IU] via SUBCUTANEOUS
  Filled 2013-02-18 (×11): qty 1

## 2013-02-18 MED ORDER — SODIUM CHLORIDE 0.9 % IV BOLUS (SEPSIS)
500.0000 mL | Freq: Once | INTRAVENOUS | Status: AC
  Administered 2013-02-18: 500 mL via INTRAVENOUS

## 2013-02-18 MED ORDER — METOPROLOL TARTRATE 12.5 MG HALF TABLET
12.5000 mg | ORAL_TABLET | Freq: Two times a day (BID) | ORAL | Status: DC
  Administered 2013-02-18 – 2013-02-21 (×6): 12.5 mg via ORAL
  Filled 2013-02-18 (×7): qty 1

## 2013-02-18 MED ORDER — CLOPIDOGREL BISULFATE 75 MG PO TABS
75.0000 mg | ORAL_TABLET | Freq: Every day | ORAL | Status: DC
  Administered 2013-02-19 – 2013-02-21 (×3): 75 mg via ORAL
  Filled 2013-02-18 (×5): qty 1

## 2013-02-18 MED ORDER — ASPIRIN EC 81 MG PO TBEC
81.0000 mg | DELAYED_RELEASE_TABLET | Freq: Every day | ORAL | Status: DC
  Administered 2013-02-19 – 2013-02-21 (×3): 81 mg via ORAL
  Filled 2013-02-18 (×3): qty 1

## 2013-02-18 MED ORDER — LORATADINE 10 MG PO TABS
10.0000 mg | ORAL_TABLET | Freq: Every day | ORAL | Status: DC
  Administered 2013-02-19 – 2013-02-21 (×3): 10 mg via ORAL
  Filled 2013-02-18 (×3): qty 1

## 2013-02-18 MED ORDER — FINASTERIDE 5 MG PO TABS
5.0000 mg | ORAL_TABLET | Freq: Every day | ORAL | Status: DC
  Administered 2013-02-19 – 2013-02-21 (×3): 5 mg via ORAL
  Filled 2013-02-18 (×3): qty 1

## 2013-02-18 NOTE — H&P (Signed)
[facility] History and Physical  [name] ZOX:[id number] DOB: [birth year] DOA: 02/18/2013  Referring physician: Dr. Fredderick Phenix PCP: Dorrene German, MD  Specialists: none  Chief Complaint: Weakness  HPI: [name] is a 79 y.o. male has a past medical history significant for coronary artery disease with previous stenting, TIA, hyperlipidemia, hypertension, presents with a chief complaint of weakness and, per his daughter, feeling that something really terrible is happening with him. He has been feeling progressively weak over the last month and it's gradually getting worse. He feels like is not able to do the things that used to do around the house. Lives by himself and has family close by that sometimes helps him. He had a recent sinus infection now status post Augmentin tx. he has a history of hyponatremia in the last 2 months with values 126 to 129; he saw his PCP earlier this week and his sodium was 121. He has no abdominal pain, denies any nausea or vomiting. He denies any fever or chills. He has no chest pain, and no shortness of breath. He does report some possible blood in the stool about a week ago, however a rectal exam done by the PCP was negative for blood in the stool, and his hemoglobin has been stable. He denies any lightheadedness or dizziness.  Review of Systems: As per history of present illness, otherwise negative  Past Medical History  Diagnosis Date  . Hypertension   . Hyperlipidemia   . Arthritis   . GERD (gastroesophageal reflux disease)   . Anemia   . Hiatal hernia   . Trouble swallowing   . AAA (abdominal aortic aneurysm)   . Macular degeneration   . Peripheral vascular disease   . CAD (coronary artery disease)    Past Surgical History  Procedure Laterality Date  . Tonsillectomy    . Coronary angioplasty with stent placement    . Back surgery  09-05-2011    . Angioplasty / stenting iliac  03-2011    Right Iliac - Gore Stent graft    Social History:   reports that he quit smoking about 22 years ago. His smoking use included Cigarettes. He smoked 0.00 packs per day. His smokeless tobacco use includes Chew. He reports that he does not drink alcohol or use illicit drugs.  Allergies  Allergen Reactions  . Hydrocodone Nausea And Vomiting  . Oxycodone Nausea And Vomiting    Family History  Problem Relation Age of Onset  . Heart failure Mother   . Diabetes Mother   . Heart disease Mother   . Pneumonia Father   . Cancer Brother   . Diabetes Brother   . Heart disease Brother     Prior to Admission medications   Medication Sig Start Date End Date Taking? Authorizing Provider  acetaminophen (TYLENOL) 500 MG tablet Take 500 mg by mouth every 6 (six) hours as needed. Pain.   Yes Historical Provider, MD  alfuzosin (UROXATRAL) 10 MG 24 hr tablet Take 10 mg by mouth daily.     Yes Historical Provider, MD  ALPRAZolam Prudy Feeler) 0.25 MG tablet Take 0.25 mg by mouth at bedtime as needed for sleep.   Yes Historical Provider, MD  Alum & Mag Hydroxide-Simeth (MAGIC MOUTHWASH) SOLN Take 5 mLs by mouth 4 (four) times daily as needed (for mouth).   Yes Historical Provider, MD  amLODipine (NORVASC) 5 MG tablet Take 5 mg by mouth daily.     Yes Historical Provider, MD  aspirin EC 81 MG  tablet Take 81 mg by mouth daily.     Yes Historical Provider, MD  atorvastatin (LIPITOR) 20 MG tablet Take 20 mg by mouth daily.     Yes Historical Provider, MD  cetirizine (ZYRTEC) 5 MG tablet Take 5 mg by mouth daily as needed for allergies. Allergies.   Yes Historical Provider, MD  clopidogrel (PLAVIX) 75 MG tablet Take 75 mg by mouth daily.     Yes Historical Provider, MD  ferrous sulfate 325 (65 FE) MG tablet Take 325 mg by mouth daily with breakfast.   Yes Historical Provider, MD  finasteride (PROSCAR) 5 MG tablet Take 5 mg by mouth daily.     Yes Historical Provider, MD  ipratropium (ATROVENT) 0.06 % nasal spray Place 2 sprays into the nose 2 (two) times daily as needed  for rhinitis (for drainage).   Yes Historical Provider, MD  lansoprazole (PREVACID) 30 MG capsule Take 30 mg by mouth daily.     Yes Historical Provider, MD  metoprolol tartrate (LOPRESSOR) 25 MG tablet Take 12.5 mg by mouth 2 (two) times daily.   Yes Historical Provider, MD  Multiple Vitamin (MULTIVITAMIN PO) Take 1 tablet by mouth daily.    Yes Historical Provider, MD  Multiple Vitamins-Minerals (ICAPS MV PO) Take 1 capsule by mouth at bedtime.    Yes Historical Provider, MD  Polyethylene Glycol 3350 (MIRALAX PO) Take 17 g by mouth at bedtime. Constipation.   Yes Historical Provider, MD  Pregabalin (LYRICA PO) Take 1 capsule by mouth at bedtime.    Yes Historical Provider, MD  Psyllium (METAMUCIL PO) Take 1 tablet by mouth every morning.    Yes Historical Provider, MD   Physical Exam: Filed Vitals:   02/18/13 1430 02/18/13 1500 02/18/13 1530 02/18/13 1752  BP: 169/74 170/71 164/63 187/64  Pulse: 65 74 65 72  Temp:      TempSrc:      Resp: 15 25 16 20   SpO2: 99% 100% 99% 100%     General:  NAD  Eyes: PERRL, EOMI  ENT: moist oropharynx  Neck: supple, no JVD  Cardiovascular: RRR without MRG   Respiratory: CTA biL, good air movement without wheezing, rhonchi or crackled  Abdomen: soft, non tender to palpation, positive bowel sounds, no guarding, no rebound  Skin: no rashes  Musculoskeletal: no peripheral edema  Psychiatric: normal mood and affect  Neurologic: CN 2-12 grossly intact, MS 5/5 in all 4  Labs on Admission:  Basic Metabolic Panel:  Recent Labs Lab 02/18/13 1351  NA 125*  K 3.8  CL 87*  CO2 30  GLUCOSE 95  BUN 14  CREATININE 0.79  CALCIUM 9.3   CBC:  Recent Labs Lab 02/18/13 1351  WBC 5.2  NEUTROABS 3.9  HGB 12.3*  HCT 34.5*  MCV 86.9  PLT 161   Radiological Exams on Admission: Dg Chest 2 View  02/18/2013  *RADIOLOGY REPORT*  Clinical Data: Weakness.  CHEST - 2 VIEW  Comparison: Single view of the chest 11/12/2012 and 03/10/2011.   Findings: Heart size is upper normal.  Lungs are clear.  No pneumothorax or pleural effusion.  IMPRESSION: No acute finding.   Original Report Authenticated By: Holley Dexter, M.D.     EKG: Independently reviewed.  Assessment/Plan Active Problems:   HYPERCHOLESTEROLEMIA   HYPERTENSION   CORONARY ARTERY DISEASE   ARTHRITIS   Hyponatremia   1. Generalized weakness - I doubt that this is related to his hyponatremia, as this is somewhat chronic. I am concerned, though, about  his ability to take care of himself. Will ask PT to evaluate the patient. I will check a TSH as well. 2. Hyponatremia - somewhat chronic, without major changes on admission. We'll do urine studies, osmolality and further investigate. 3. Hypertension - will restart home medications and follow. 4. Coronary artery disease - continue home medications. No evidence of decompensated heart failure at this point  5. BPH - continue home medications 6. Prophylaxis - heparin  Code Status: Full code  Family Communication: Daughter over the phone  Disposition Plan: Likely to be discharged once PT evaluates patient  Time spent: 45 minutes  Costin M. Elvera Lennox, MD [facility] Pager [phone]  If 7PM-7AM, please contact night-coverage [email or web address] Password [facility] 02/18/2013, 6:39 PM

## 2013-02-18 NOTE — ED Notes (Signed)
Pt returned from xray

## 2013-02-18 NOTE — ED Notes (Addendum)
Daughter Rizwan Kuyper telephone number- (h) [phone] (c) [phone]

## 2013-02-18 NOTE — ED Provider Notes (Signed)
History     CSN: [id number]  Arrival date & time 02/18/13  1236   First MD Initiated Contact with Patient 02/18/13 1347      Chief Complaint  Patient presents with  . Dehydration  . Abnormal Lab    (Consider location/radiation/quality/duration/timing/severity/associated sxs/prior treatment) HPI Comments: Patient presents with generalized weakness. He states he's been feeling of weakness over the last week and it's gradually gotten worse. He's been seen twice this week at his primary care physician's office that [facility]. He was noted today to have a blood sodium of 121 based on blood work that was drawn on Monday. He does have a history of hyponatremia in the past with the last value being 126 2 months ago and the value prior to that was 129 about a year ago. He had a recent sinus infection history with Augmentin but denies any cough or chest congestion. He denies any vomiting or diarrhea. He has had some increased burning in his upper abdomen and does have a history of acid reflux. He states this burning started after he ate some broccoli a few days ago. He denies any other abdominal pain. He denies any fevers or other recent illnesses. He denies any chest pain or shortness of breath. He does say that he had some possible blood in his stool about a week ago however he did have a rectal exam this week by his primary care physician that was negative for blood.   Past Medical History  Diagnosis Date  . Hypertension   . Hyperlipidemia   . Arthritis   . GERD (gastroesophageal reflux disease)   . Anemia   . Hiatal hernia   . Trouble swallowing   . AAA (abdominal aortic aneurysm)   . Macular degeneration   . Peripheral vascular disease   . CAD (coronary artery disease)     Past Surgical History  Procedure Laterality Date  . Tonsillectomy    . Coronary angioplasty with stent placement    . Back surgery  09-05-2011    . Angioplasty / stenting iliac  03-2011    Right Iliac -  Gore Stent graft     Family History  Problem Relation Age of Onset  . Heart failure Mother   . Diabetes Mother   . Heart disease Mother   . Pneumonia Father   . Cancer Brother   . Diabetes Brother   . Heart disease Brother     History  Substance Use Topics  . Smoking status: Former Smoker    Types: Cigarettes    Quit date: 12/08/1990  . Smokeless tobacco: Current User    Types: Chew  . Alcohol Use: No      Review of Systems  Constitutional: Negative for fever, chills, diaphoresis and fatigue.  HENT: Negative for congestion, rhinorrhea and sneezing.   Eyes: Negative.   Respiratory: Negative for cough, chest tightness and shortness of breath.   Cardiovascular: Negative for chest pain and leg swelling.  Gastrointestinal: Positive for blood in stool. Negative for nausea, vomiting, abdominal pain and diarrhea.  Genitourinary: Negative for frequency, hematuria, flank pain and difficulty urinating.  Musculoskeletal: Negative for back pain and arthralgias.  Skin: Negative for rash.  Neurological: Positive for weakness. Negative for dizziness, speech difficulty, numbness and headaches.    Allergies  Hydrocodone and Oxycodone  Home Medications   Current Outpatient Rx  Name  Route  Sig  Dispense  Refill  . acetaminophen (TYLENOL) 500 MG tablet   Oral  Take 500 mg by mouth every 6 (six) hours as needed. Pain.         Marland Kitchen alfuzosin (UROXATRAL) 10 MG 24 hr tablet   Oral   Take 10 mg by mouth daily.           Marland Kitchen ALPRAZolam (XANAX) 0.25 MG tablet   Oral   Take 0.25 mg by mouth at bedtime as needed for sleep.         Marland Kitchen Alum & Mag Hydroxide-Simeth (MAGIC MOUTHWASH) SOLN   Oral   Take 5 mLs by mouth 4 (four) times daily as needed (for mouth).         Marland Kitchen amLODipine (NORVASC) 5 MG tablet   Oral   Take 5 mg by mouth daily.           Marland Kitchen aspirin EC 81 MG tablet   Oral   Take 81 mg by mouth daily.           Marland Kitchen atorvastatin (LIPITOR) 20 MG tablet   Oral   Take 20  mg by mouth daily.           . cetirizine (ZYRTEC) 5 MG tablet   Oral   Take 5 mg by mouth daily as needed for allergies. Allergies.         Marland Kitchen clopidogrel (PLAVIX) 75 MG tablet   Oral   Take 75 mg by mouth daily.           . ferrous sulfate 325 (65 FE) MG tablet   Oral   Take 325 mg by mouth daily with breakfast.         . finasteride (PROSCAR) 5 MG tablet   Oral   Take 5 mg by mouth daily.           Marland Kitchen ipratropium (ATROVENT) 0.06 % nasal spray   Nasal   Place 2 sprays into the nose 2 (two) times daily as needed for rhinitis (for drainage).         . lansoprazole (PREVACID) 30 MG capsule   Oral   Take 30 mg by mouth daily.           . metoprolol tartrate (LOPRESSOR) 25 MG tablet   Oral   Take 12.5 mg by mouth 2 (two) times daily.         . Multiple Vitamin (MULTIVITAMIN PO)   Oral   Take 1 tablet by mouth daily.          . Multiple Vitamins-Minerals (ICAPS MV PO)   Oral   Take 1 capsule by mouth at bedtime.          . Polyethylene Glycol 3350 (MIRALAX PO)   Oral   Take 17 g by mouth at bedtime. Constipation.         . Pregabalin (LYRICA PO)   Oral   Take 1 capsule by mouth at bedtime.          . Psyllium (METAMUCIL PO)   Oral   Take 1 tablet by mouth every morning.            BP 165/88  Pulse 71  Temp(Src) 97.9 F (36.6 C) (Oral)  Resp 20  SpO2 100%  Physical Exam  Constitutional: He is oriented to person, place, and time. He appears well-developed and well-nourished.  HENT:  Head: Normocephalic and atraumatic.  Eyes: Pupils are equal, round, and reactive to light.  Neck: Normal range of motion. Neck supple.  Cardiovascular: Normal rate, regular rhythm and normal  heart sounds.   Pulmonary/Chest: Effort normal and breath sounds normal. No respiratory distress. He has no wheezes. He has no rales. He exhibits no tenderness.  Abdominal: Soft. Bowel sounds are normal. There is no tenderness. There is no rebound and no guarding.   Musculoskeletal: Normal range of motion. He exhibits no edema.  Lymphadenopathy:    He has no cervical adenopathy.  Neurological: He is alert and oriented to person, place, and time. He has normal strength. No cranial nerve deficit or sensory deficit. GCS eye subscore is 4. GCS verbal subscore is 5. GCS motor subscore is 6.  Skin: Skin is warm and dry. No rash noted.  Psychiatric: He has a normal mood and affect.    ED Course  Procedures (including critical care time)  Results for orders placed during the hospital encounter of 02/18/13  CBC WITH DIFFERENTIAL      Result Value Range   WBC 5.2  4.0 - 10.5 K/uL   RBC 3.97 (*) 4.22 - 5.81 MIL/uL   Hemoglobin 12.3 (*) 13.0 - 17.0 g/dL   HCT 96.0 (*) 45.4 - 09.8 %   MCV 86.9  78.0 - 100.0 fL   MCH 31.0  26.0 - 34.0 pg   MCHC 35.7  30.0 - 36.0 g/dL   RDW 11.9  14.7 - 82.9 %   Platelets 161  150 - 400 K/uL   Neutrophils Relative 75  43 - 77 %   Neutro Abs 3.9  1.7 - 7.7 K/uL   Lymphocytes Relative 13  12 - 46 %   Lymphs Abs 0.7  0.7 - 4.0 K/uL   Monocytes Relative 10  3 - 12 %   Monocytes Absolute 0.5  0.1 - 1.0 K/uL   Eosinophils Relative 1  0 - 5 %   Eosinophils Absolute 0.0  0.0 - 0.7 K/uL   Basophils Relative 0  0 - 1 %   Basophils Absolute 0.0  0.0 - 0.1 K/uL  BASIC METABOLIC PANEL      Result Value Range   Sodium 125 (*) 135 - 145 mEq/L   Potassium 3.8  3.5 - 5.1 mEq/L   Chloride 87 (*) 96 - 112 mEq/L   CO2 30  19 - 32 mEq/L   Glucose, Bld 95  70 - 99 mg/dL   BUN 14  6 - 23 mg/dL   Creatinine, Ser 5.62  0.50 - 1.35 mg/dL   Calcium 9.3  8.4 - 13.0 mg/dL   GFR calc non Af Amer 76 (*) >90 mL/min   GFR calc Af Amer 88 (*) >90 mL/min  URINALYSIS, ROUTINE W REFLEX MICROSCOPIC      Result Value Range   Color, Urine YELLOW  YELLOW   APPearance CLEAR  CLEAR   Specific Gravity, Urine 1.008  1.005 - 1.030   pH 7.0  5.0 - 8.0   Glucose, UA NEGATIVE  NEGATIVE mg/dL   Hgb urine dipstick NEGATIVE  NEGATIVE   Bilirubin Urine  NEGATIVE  NEGATIVE   Ketones, ur NEGATIVE  NEGATIVE mg/dL   Protein, ur NEGATIVE  NEGATIVE mg/dL   Urobilinogen, UA 0.2  0.0 - 1.0 mg/dL   Nitrite NEGATIVE  NEGATIVE   Leukocytes, UA NEGATIVE  NEGATIVE   Dg Chest 2 View  02/18/2013  *RADIOLOGY REPORT*  Clinical Data: Weakness.  CHEST - 2 VIEW  Comparison: Single view of the chest 11/12/2012 and 03/10/2011.  Findings: Heart size is upper normal.  Lungs are clear.  No pneumothorax or pleural effusion.  IMPRESSION: No acute finding.   Original Report Authenticated By: Holley Dexter, M.D.     No results found.  Date: 02/18/2013  Rate: 62  Rhythm: normal sinus rhythm  QRS Axis: normal  Intervals: normal  ST/T Wave abnormalities: nonspecific ST/T changes  Conduction Disutrbances:none  Narrative Interpretation:   Old EKG Reviewed: unchanged    1. Hyponatremia   2. Coronary atherosclerosis of unspecified type of vessel, native or graft   3. Unspecified essential hypertension       MDM  Patient with hyponatremia although not significantly changed from his value 2 months ago and weakness. His family is more comfortable he is admitted for further treatment and I consult with the hospitalist who agrees to this plan.        Rolan Bucco, MD 02/18/13 [phone]

## 2013-02-18 NOTE — ED Notes (Signed)
Pt sent here from [facility] for hyponatremia, dehydration, sodium was 121.

## 2013-02-19 LAB — BASIC METABOLIC PANEL
BUN: 9 mg/dL (ref 6–23)
BUN: 10 mg/dL (ref 6–23)
Calcium: 8.8 mg/dL (ref 8.4–10.5)
Calcium: 8.9 mg/dL (ref 8.4–10.5)
Chloride: 86 mEq/L — ABNORMAL LOW (ref 96–112)
Creatinine, Ser: 0.7 mg/dL (ref 0.50–1.35)
Creatinine, Ser: 0.7 mg/dL (ref 0.50–1.35)
GFR calc Af Amer: >90 mL/min (ref >90)
GFR calc Af Amer: >90 mL/min (ref >90)
GFR calc non Af Amer: 79 mL/min — ABNORMAL LOW (ref >90)
Glucose, Bld: 132 mg/dL — ABNORMAL HIGH (ref 70–99)
Potassium: 3.7 mEq/L (ref 3.5–5.1)

## 2013-02-19 LAB — CHLORIDE, URINE, RANDOM: Chloride Urine: 85 mEq/L

## 2013-02-19 LAB — UREA NITROGEN, URINE: Urea Nitrogen, Ur: 116 mg/dL

## 2013-02-19 MED ORDER — AMLODIPINE BESYLATE 10 MG PO TABS
10.0000 mg | ORAL_TABLET | Freq: Every day | ORAL | Status: DC
  Administered 2013-02-20 – 2013-02-21 (×2): 10 mg via ORAL
  Filled 2013-02-19 (×2): qty 1

## 2013-02-19 MED ORDER — SODIUM CHLORIDE 0.9 % IV BOLUS (SEPSIS)
500.0000 mL | Freq: Once | INTRAVENOUS | Status: AC
  Administered 2013-02-19: 500 mL via INTRAVENOUS

## 2013-02-19 NOTE — Progress Notes (Signed)
UR completed 

## 2013-02-19 NOTE — Progress Notes (Signed)
[facility] PROGRESS NOTE  [name] ZOX:[id number] DOB: [birth year] DOA: 02/18/2013 PCP: Dorrene German, MD  Brief Narrative:  Assessment/Plan: 1. Generalized weakness - I doubt that this is related to his hyponatremia, as this is somewhat chronic. This might be related to his recent infection. Physical therapy recommended skilled nursing facility on discharge (short-term), which patient and daughter agree. 2. Hyponatremia - somewhat chronic, without major changes on admission. Continue to monitor. 3. Hypertension - will restart home medications and follow. Increase his Norvasc to 10 daily. 4. Coronary artery disease - continue home medications. No evidence of decompensated heart failure at this point  5. BPH - continue home medications 6. Prophylaxis - heparin  Code Status: Full Family Communication: Daughter at bedside  Disposition Plan: Skilled nursing facility when bed available  Consultants:  None  Procedures:  None   Antibiotics:  none  HPI/Subjective: - no complaints today, feels well  Objective: Filed Vitals:   02/18/13 1752 02/18/13 1900 02/18/13 2039 02/19/13 0436  BP: 187/64 165/88 180/79 164/70  Pulse: 72 71 78 72  Temp:   98.3 F (36.8 C) 98.1 F (36.7 C)  TempSrc:   Oral Oral  Resp: 20  18 18   Height:   5\' 9"  (1.753 m)   Weight:   67.8 kg (149 lb 7.6 oz)   SpO2: 100% 100% 98% 96%    Intake/Output Summary (Last 24 hours) at 02/19/13 1355 Last data filed at 02/19/13 1000  Gross per 24 hour  Intake      0 ml  Output    700 ml  Net   -700 ml   Filed Weights   02/18/13 2039  Weight: 67.8 kg (149 lb 7.6 oz)    Exam:   General:  NAD  Cardiovascular: regular rate and rhythm, without MRG  Respiratory: good air movement, clear to auscultation throughout, no wheezing, ronchi or rales  Abdomen: soft, not tender to palpation, positive bowel sounds  MSK: no peripheral edema  Neuro: CN 2-12 grossly intact, MS 5/5 in all 4  Data  Reviewed: Basic Metabolic Panel:  Recent Labs Lab 02/18/13 1351 02/18/13 2127 02/19/13 0956  NA 125* 126* 122*  K 3.8 4.0 3.7  CL 87* 89* 87*  CO2 30 29 27   GLUCOSE 95 104* 132*  BUN 14 11 9   CREATININE 0.79 0.74 0.70  CALCIUM 9.3 9.2 8.8   CBC:  Recent Labs Lab 02/18/13 1351  WBC 5.2  NEUTROABS 3.9  HGB 12.3*  HCT 34.5*  MCV 86.9  PLT 161   Studies: Dg Chest 2 View  02/18/2013  *RADIOLOGY REPORT*  Clinical Data: Weakness.  CHEST - 2 VIEW  Comparison: Single view of the chest 11/12/2012 and 03/10/2011.  Findings: Heart size is upper normal.  Lungs are clear.  No pneumothorax or pleural effusion.  IMPRESSION: No acute finding.   Original Report Authenticated By: Holley Dexter, M.D.     Scheduled Meds: . alfuzosin  10 mg Oral Daily  . amLODipine  5 mg Oral Daily  . aspirin EC  81 mg Oral Daily  . atorvastatin  20 mg Oral Daily  . clopidogrel  75 mg Oral Q breakfast  . ferrous sulfate  325 mg Oral Q breakfast  . finasteride  5 mg Oral Daily  . heparin  5,000 Units Subcutaneous Q8H  . loratadine  10 mg Oral Daily  . metoprolol tartrate  12.5 mg Oral BID  . pantoprazole  20 mg Oral Daily  . sodium chloride  500  mL Intravenous Once  . sodium chloride  3 mL Intravenous Q12H   Continuous Infusions:   Active Problems:   HYPERCHOLESTEROLEMIA   HYPERTENSION   CORONARY ARTERY DISEASE   ARTHRITIS   Hyponatremia   Pamella Pert, MD [facility] Pager [phone]. If 7 PM - 7 AM, please contact night-coverage at [email or web address], password [facility] 02/19/2013, 1:55 PM  LOS: 1 day

## 2013-02-19 NOTE — Evaluation (Signed)
Physical Therapy Evaluation Patient Details Name: [name] MRN: [id number] DOB: December 06, 1918 Today's Date: 02/19/2013 Time: 7829-5621 PT Time Calculation (min): 25 min  PT Assessment / Plan / Recommendation Clinical Impression  Pt adm for weakness.  Pt lives alone and currently unable to mobilize independently.  Needs skilled PT to maximize I and safety so pt can eventually return home.  Recommend ST-SNFbefore her returns home.    PT Assessment  Patient needs continued PT services    Follow Up Recommendations  SNF    Does the patient have the potential to tolerate intense rehabilitation      Barriers to Discharge Decreased caregiver support      Equipment Recommendations  None recommended by PT    Recommendations for Other Services     Frequency Min 3X/week    Precautions / Restrictions Precautions Precautions: Fall   Pertinent Vitals/Pain N/A      Mobility  Bed Mobility Bed Mobility: Supine to Sit;Sitting - Scoot to Edge of Bed Supine to Sit: 4: Min assist;HOB elevated Sitting - Scoot to Edge of Bed: 5: Supervision Details for Bed Mobility Assistance: Assist to bring trunk up. Transfers Transfers: Sit to Stand;Stand to Sit Sit to Stand: 4: Min assist;With upper extremity assist;From bed Stand to Sit: 4: Min assist;With armrests;To chair/3-in-1;With upper extremity assist Ambulation/Gait Ambulation/Gait Assistance: 4: Min assist Ambulation Distance (Feet): 175 Feet Assistive device: Rollator Ambulation/Gait Assistance Details: verbal cues to stand more erect and stay closer to walker. Gait Pattern: Step-through pattern;Decreased stride length;Trunk flexed;Decreased step length - left;Decreased step length - right Gait velocity: decr    Exercises     PT Diagnosis: Difficulty walking;Generalized weakness  PT Problem List: Decreased strength;Decreased activity tolerance;Decreased balance;Decreased mobility PT Treatment Interventions: DME instruction;Gait  training;Patient/family education;Functional mobility training;Therapeutic activities;Therapeutic exercise;Balance training   PT Goals Acute Rehab PT Goals PT Goal Formulation: With patient Time For Goal Achievement: 02/26/13 Potential to Achieve Goals: Good Pt will go Supine/Side to Sit: with modified independence PT Goal: Supine/Side to Sit - Progress: Goal set today Pt will go Sit to Supine/Side: with modified independence PT Goal: Sit to Supine/Side - Progress: Goal set today Pt will go Sit to Stand: with modified independence PT Goal: Sit to Stand - Progress: Goal set today Pt will go Stand to Sit: with modified independence PT Goal: Stand to Sit - Progress: Goal set today Pt will Ambulate: 51 - 150 feet;with supervision;with least restrictive assistive device PT Goal: Ambulate - Progress: Goal set today  Visit Information  Last PT Received On: 02/19/13 Assistance Needed: +1    Subjective Data  Subjective: Pt states he went to [facility] for therapy before. Patient Stated Goal: Go to [facility] and then back home.   Prior Functioning  Home Living Lives With: Alone Available Help at Discharge: Family;Available PRN/intermittently Type of Home: House Home Access: Level entry Home Layout: One level Home Adaptive Equipment: Walker - four wheeled Prior Function Level of Independence: Independent with assistive device(s) Vocation: Retired Musician: Surveyor, mining Overall Cognitive Status: Impaired Arousal/Alertness: Awake/alert Orientation Level: Appears intact for tasks assessed Behavior During Session: [facility] for tasks performed Cognition - Other Comments: Appeared a little mixed up thinking I was going to take him to [facility] for therapy.    Extremity/Trunk Assessment Right Lower Extremity Assessment RLE ROM/Strength/Tone: Deficits RLE ROM/Strength/Tone Deficits: grossly 4/5 Left Lower Extremity Assessment LLE  ROM/Strength/Tone: Deficits LLE ROM/Strength/Tone Deficits: grossly 4/5   Balance Balance Balance Assessed: Yes  Static Standing Balance Static Standing - Balance Support: Bilateral upper extremity supported Static Standing - Level of Assistance: 5: Stand by assistance  End of Session PT - End of Session Equipment Utilized During Treatment: Gait belt Activity Tolerance: Patient tolerated treatment well Patient left: in chair;with call bell/phone within reach Nurse Communication: Mobility status  GP Functional Assessment Tool Used: clinical judgement Functional Limitation: Mobility: Walking and moving around Mobility: Walking and Moving Around Current Status (W0981): At least 1 percent but less than 20 percent impaired, limited or restricted Mobility: Walking and Moving Around Goal Status [phone]): 0 percent impaired, limited or restricted   [facility] 02/19/2013, 12:03 PM  [facility] PT [phone]

## 2013-02-20 LAB — BASIC METABOLIC PANEL
CO2: 25 mEq/L (ref 19–32)
Calcium: 8.5 mg/dL (ref 8.4–10.5)
Creatinine, Ser: 0.7 mg/dL (ref 0.50–1.35)
GFR calc non Af Amer: 79 mL/min — ABNORMAL LOW (ref >90)
Sodium: 123 mEq/L — ABNORMAL LOW (ref 135–145)

## 2013-02-20 LAB — CORTISOL: Cortisol, Plasma: 18.6 ug/dL

## 2013-02-20 MED ORDER — POLYETHYLENE GLYCOL 3350 17 G PO PACK
17.0000 g | PACK | Freq: Two times a day (BID) | ORAL | Status: DC
  Administered 2013-02-20 – 2013-02-21 (×3): 17 g via ORAL
  Filled 2013-02-20 (×4): qty 1

## 2013-02-20 NOTE — Progress Notes (Signed)
  Echocardiogram 2D Echocardiogram has been performed.  [name] A 02/20/2013, 3:27 PM

## 2013-02-20 NOTE — Progress Notes (Addendum)
[facility] PROGRESS NOTE  [name] JYN:[id number] DOB: [birth year] DOA: 02/18/2013 PCP: Dorrene German, MD  Brief Narrative:  Assessment/Plan: 1. Generalized weakness - I doubt that this is related to his hyponatremia, as this is somewhat chronic. This might be related to his recent infection. Physical therapy recommended skilled nursing facility on discharge (short-term), which patient and daughter agree. We'll likely be able to go on 02/21/2013. 2. Hyponatremia - somewhat chronic, without major changes on admission, likely SIADH. Continue to monitor. Sodium decreased to 120 last night, back to 123 this morning. Continue fluid restriction. 3. Hypertension - will restart home medications and follow. Increase his Norvasc to 10 daily. 4. Coronary artery disease - continue home medications. No evidence of decompensated heart failure at this point  5. BPH - continue home medications 6. Prophylaxis - heparin  Code Status: Full Family Communication: None Disposition Plan: Skilled nursing facility when bed available  Consultants:  None  Procedures:  None   Antibiotics:  none  HPI/Subjective: - no complaints today, feels well, would like to walk more  Objective: Filed Vitals:   02/19/13 2007 02/19/13 2123 02/20/13 0515 02/20/13 1014  BP: 140/51 175/71 158/65 149/58  Pulse: 66 65 61 67  Temp: 98.7 F (37.1 C)  98 F (36.7 C) 98 F (36.7 C)  TempSrc: Oral  Oral Oral  Resp: 18  18 18   Height:      Weight: 67.8 kg (149 lb 7.6 oz)     SpO2: 93%  96% 98%    Intake/Output Summary (Last 24 hours) at 02/20/13 1020 Last data filed at 02/20/13 1000  Gross per 24 hour  Intake    600 ml  Output    645 ml  Net    -45 ml   Filed Weights   02/18/13 2039 02/19/13 2007  Weight: 67.8 kg (149 lb 7.6 oz) 67.8 kg (149 lb 7.6 oz)    Exam:   General:  NAD  Cardiovascular: regular rate and rhythm, without MRG  Respiratory: good air movement, clear to auscultation  throughout, no wheezing, ronchi or rales  Abdomen: soft, not tender to palpation, positive bowel sounds  MSK: no peripheral edema  Neuro: CN 2-12 grossly intact, MS 5/5 in all 4  Data Reviewed: Basic Metabolic Panel:  Recent Labs Lab 02/18/13 1351 02/18/13 2127 02/19/13 0956 02/19/13 1750 02/20/13 0505  NA 125* 126* 122* 120* 123*  K 3.8 4.0 3.7 3.4* 3.6  CL 87* 89* 87* 86* 87*  CO2 30 29 27 21 25   GLUCOSE 95 104* 132* 144* 95  BUN 14 11 9 10 9   CREATININE 0.79 0.74 0.70 0.70 0.70  CALCIUM 9.3 9.2 8.8 8.9 8.5   CBC:  Recent Labs Lab 02/18/13 1351  WBC 5.2  NEUTROABS 3.9  HGB 12.3*  HCT 34.5*  MCV 86.9  PLT 161   Studies: Dg Chest 2 View  02/18/2013  *RADIOLOGY REPORT*  Clinical Data: Weakness.  CHEST - 2 VIEW  Comparison: Single view of the chest 11/12/2012 and 03/10/2011.  Findings: Heart size is upper normal.  Lungs are clear.  No pneumothorax or pleural effusion.  IMPRESSION: No acute finding.   Original Report Authenticated By: Holley Dexter, M.D.     Scheduled Meds: . alfuzosin  10 mg Oral Daily  . amLODipine  10 mg Oral Daily  . aspirin EC  81 mg Oral Daily  . atorvastatin  20 mg Oral Daily  . clopidogrel  75 mg Oral Q breakfast  . ferrous sulfate  325 mg Oral Q breakfast  . finasteride  5 mg Oral Daily  . heparin  5,000 Units Subcutaneous Q8H  . loratadine  10 mg Oral Daily  . metoprolol tartrate  12.5 mg Oral BID  . pantoprazole  20 mg Oral Daily  . sodium chloride  3 mL Intravenous Q12H   Continuous Infusions:   Active Problems:   HYPERCHOLESTEROLEMIA   HYPERTENSION   CORONARY ARTERY DISEASE   ARTHRITIS   Hyponatremia   Pamella Pert, MD [facility] Pager [phone]. If 7 PM - 7 AM, please contact night-coverage at [email or web address], password [facility] 02/20/2013, 10:20 AM  LOS: 2 days

## 2013-02-21 ENCOUNTER — Inpatient Hospital Stay (HOSPITAL_COMMUNITY): Payer: Medicare Other

## 2013-02-21 LAB — BASIC METABOLIC PANEL
CO2: 24 mEq/L (ref 19–32)
Calcium: 8.8 mg/dL (ref 8.4–10.5)
Creatinine, Ser: 0.75 mg/dL (ref 0.50–1.35)
GFR calc non Af Amer: 77 mL/min — ABNORMAL LOW (ref >90)
Glucose, Bld: 97 mg/dL (ref 70–99)

## 2013-02-21 MED ORDER — TRAMADOL HCL 50 MG PO TABS: 50.0000 mg | ORAL_TABLET | Freq: Four times a day (QID) | ORAL | Status: DC | PRN

## 2013-02-21 MED ORDER — TRAMADOL HCL 50 MG PO TABS
50.0000 mg | ORAL_TABLET | Freq: Four times a day (QID) | ORAL | Status: DC | PRN
  Administered 2013-02-21: 50 mg via ORAL
  Filled 2013-02-21: qty 1

## 2013-02-21 NOTE — Clinical Social Work Placement (Addendum)
Clinical Social Work Department CLINICAL SOCIAL WORK PLACEMENT NOTE 02/21/2013  Patient:  [name], [name]  Account Number:  [id number] Admit date:  02/18/2013  Clinical Social Worker:  Genelle Bal, LCSW  Date/time:  02/21/2013 12:50 PM  Clinical Social Work is seeking post-discharge placement for this patient at the following level of care:   SKILLED NURSING   (*CSW will update this form in Epic as items are completed)     Patient/family provided with [facility] Department of Clinical Social Work's list of facilities offering this level of care within the geographic area requested by the patient (or if unable, by the patient's family).  02/21/2013  Patient/family informed of their freedom to choose among providers that offer the needed level of care, that participate in Medicare, Medicaid or managed care program needed by the patient, have an available bed and are willing to accept the patient.    Patient/family informed of [facility]' ownership interest in [facility], as well as of the fact that they are under no obligation to receive care at this facility.  PASARR submitted to EDS on  PASARR number received from EDS on   FL2 transmitted to all facilities in geographic area requested by pt/family on  02/21/2013 FL2 transmitted to all facilities within larger geographic area on 02/21/2013  Patient informed that his/her managed care company has contracts with or will negotiate with  certain facilities, including the following:     Patient/family informed of bed offers received: 02/21/13  Patient chooses bed at [facility] Physician recommends and patient chooses bed at    Patient to be transferred to [facility] on 02/21/13  Patient to be transferred to facility by ambulance  The following physician request were entered in Epic:   Additional Comments: 02/21/13 - Clinicals faxed out to [facility] in [place] and [place] where patient  lives. 02/21/13 - CSW contacted patient's son-in-law to advise that ambulance called.

## 2013-02-21 NOTE — Clinical Social Work Psychosocial (Signed)
Clinical Social Work Department BRIEF PSYCHOSOCIAL ASSESSMENT 02/21/2013  Patient:  [name], [name]     Account Number:  [id number]     Admit date:  02/18/2013  Clinical Social Worker:  Delmer Islam  Date/Time:  02/21/2013 12:40 PM  Referred by:  Physician  Date Referred:  02/21/2013 Referred for  SNF Placement   Other Referral:   Interview type:  Patient Other interview type:   CSW also talked with patient's daughter Corde Antonini ([phone]-home ph) regarding ST rehab.    PSYCHOSOCIAL DATA Living Status:  ALONE Admitted from facility:   Level of care:   Primary support name:  Agustin Cree Primary support relationship to patient:  CHILD, ADULT Degree of support available:   Good support from daughter.    CURRENT CONCERNS Current Concerns  Post-Acute Placement   Other Concerns:    SOCIAL WORK ASSESSMENT / PLAN CSW talked with patient regarding dishcarge plans and he is already aware that he needs short-term rehab and is very agreeable going to get stronger before going home. Patient reported that he has been to [facility] a couple of times and wants to go there for rehab.    Mr. Edgerly gave CSW permission to update his daughter and gave her phone number. CSW talked by phone with Mrs. Ilsa Iha and updated her on discharge plans. She is fine with the plan and thanked Child psychotherapist for call. Daughter works 3rd shirft and was appreciative of call before she went to bed. She asked that CSw contact her husband Mardella Layman at [phone].   Assessment/plan status:  Psychosocial Support/Ongoing Assessment of Needs Other assessment/ plan:   Information/referral to community resources:    PATIENT'S/FAMILY'S RESPONSE TO PLAN OF CARE: Patient very pleasant, a bit hard of hearing, and very agreeable to short-term rehab at [facility].

## 2013-02-21 NOTE — Discharge Summary (Signed)
Physician Discharge Summary  [name]:[id number] DOB: Dec 05, 1918 DOA: 02/18/2013  PCP: Dorrene German, MD  Admit date: 02/18/2013 Discharge date: 02/21/2013  Time spent: 40 minutes  Recommendations for Outpatient Follow-up:  1. Please follow up with your PCP in 1 week from discharge for a basic metabolic repeat  Discharge Diagnoses:  Active Problems:   HYPERCHOLESTEROLEMIA   HYPERTENSION   CORONARY ARTERY DISEASE   ARTHRITIS   Hyponatremia  Discharge Condition: stable  Diet recommendation: Heart healthy, 1200 mL fluid restriction per day  [facility] Weights   02/18/13 2039 02/19/13 2007 02/20/13 2126  Weight: 67.8 kg (149 lb 7.6 oz) 67.8 kg (149 lb 7.6 oz) 68.5 kg (151 lb 0.2 oz)   History of present illness:  [name] is a 79 y.o. male has a past medical history significant for coronary artery disease with previous stenting, TIA, hyperlipidemia, hypertension, presents with a chief complaint of weakness and, per his daughter, feeling that something really terrible is happening with him. He has been feeling progressively weak over the last month and it's gradually getting worse. He feels like is not able to do the things that used to do around the house. Lives by himself and has family close by that sometimes helps him. He had a recent sinus infection now status post Augmentin tx. he has a history of hyponatremia in the last 2 months with values 126 to 129; he saw his PCP earlier this week and his sodium was 121. He has no abdominal pain, denies any nausea or vomiting. He denies any fever or chills. He has no chest pain, and no shortness of breath. He does report some possible blood in the stool about a week ago, however a rectal exam done by the PCP was negative for blood in the stool, and his hemoglobin has been stable. He denies any lightheadedness or dizziness.  Hospital Course:  1. Generalized weakness - I doubt that this was related to his hyponatremia, as his hyponatremia is  chronic. This might be related to his recent infection which was treated prior to his hospitalization. Physical therapy recommended skilled nursing facility on discharge (short-term), which patient and daughter agree.  2. Hyponatremia - somewhat chronic, without major changes on admission, likely SIADH for which he may benefit from fluid restriction. Continue to monitor on an outpatient basis as well. TSH was within normal limits, and his cortisol was also normal. 3. Hypertension - continue home medications 4. Coronary artery disease - continue home medications. No evidence of decompensated heart failure at this point. His 2-D echo was repeated and findings below.  5. BPH - continue home medications 6. Back pain - he has been complaining of back pain at the site of his previous lumbar laminectomy. I obtain a lumbar spine x-ray which showed no acute findings.  Procedures:  2D echo - Left ventricle: The cavity size was normal. There was moderate concentric hypertrophy. Systolic function was normal. The estimated ejection fraction was in the range of 60% to 65%. Doppler parameters are consistent with abnormal left ventricular relaxation (grade 1 diastolic dysfunction). The E'/e' ratio is >10, suggesting elevated LV filling pressure. - Aortic valve: Sclerosis without stenosis. No regurgitation. - Mitral valve: Calcified annulus, mostly posterior. The mitral leaflet tips are sclerotic. The subvalvular mitral apparatus is thickened and redundant. Trivial regurgitation. - Tricuspid valve: Poorly visualized. Trivial regurgitation. Unable to estimate RVSP due to poor TR envelope.  Consultations:  none  Discharge Exam: Filed Vitals:   02/20/13 1723 02/20/13  2126 02/21/13 0528 02/21/13 1000  BP: 147/68 127/68 144/65 149/81  Pulse: 62 70 66 89  Temp: 98.5 F (36.9 C) 98.3 F (36.8 C) 98.9 F (37.2 C) 98.4 F (36.9 C)  TempSrc: Oral Oral Oral Oral  Resp: 18 16 18 20   Height:       Weight:  68.5 kg (151 lb 0.2 oz)    SpO2: 97% 98% 95% 95%   General: He is in no acute distress Cardiovascular: Heart is regular, without murmurs Respiratory: Clear to auscultation bilateral  Discharge Instructions   Future Appointments Provider Department Dept Phone   10/17/2014 11:15 AM [name], MD [facility] [phone]       Medication List    TAKE these medications       acetaminophen 500 MG tablet  Commonly known as:  TYLENOL  Take 500 mg by mouth every 6 (six) hours as needed. Pain.     alfuzosin 10 MG 24 hr tablet  Commonly known as:  UROXATRAL  Take 10 mg by mouth daily.     ALPRAZolam 0.25 MG tablet  Commonly known as:  XANAX  Take 0.25 mg by mouth at bedtime as needed for sleep.     amLODipine 5 MG tablet  Commonly known as:  NORVASC  Take 5 mg by mouth daily.     aspirin EC 81 MG tablet  Take 81 mg by mouth daily.     atorvastatin 20 MG tablet  Commonly known as:  LIPITOR  Take 20 mg by mouth daily.     cetirizine 5 MG tablet  Commonly known as:  ZYRTEC  Take 5 mg by mouth daily as needed for allergies. Allergies.     clopidogrel 75 MG tablet  Commonly known as:  PLAVIX  Take 75 mg by mouth daily.     ferrous sulfate 325 (65 FE) MG tablet  Take 325 mg by mouth daily with breakfast.     finasteride 5 MG tablet  Commonly known as:  PROSCAR  Take 5 mg by mouth daily.     ipratropium 0.06 % nasal spray  Commonly known as:  ATROVENT  Place 2 sprays into the nose 2 (two) times daily as needed for rhinitis (for drainage).     lansoprazole 30 MG capsule  Commonly known as:  PREVACID  Take 30 mg by mouth daily.     LYRICA PO  Take 1 capsule by mouth at bedtime.     magic mouthwash Soln  Take 5 mLs by mouth 4 (four) times daily as needed (for mouth).     METAMUCIL PO  Take 1 tablet by mouth every morning.     metoprolol tartrate 25 MG tablet  Commonly known as:  LOPRESSOR  Take 12.5 mg by mouth 2 (two)  times daily.     MIRALAX PO  Take 17 g by mouth at bedtime. Constipation.     MULTIVITAMIN PO  Take 1 tablet by mouth daily.     ICAPS MV PO  Take 1 capsule by mouth at bedtime.     traMADol 50 MG tablet  Commonly known as:  ULTRAM  Take 1 tablet (50 mg total) by mouth every 6 (six) hours as needed.          The results of significant diagnostics from this hospitalization (including imaging, microbiology, ancillary and laboratory) are listed below for reference.    Significant Diagnostic Studies: Dg Chest 2 View  02/18/2013  *RADIOLOGY REPORT*  Clinical  Data: Weakness.  CHEST - 2 VIEW  Comparison: Single view of the chest 11/12/2012 and 03/10/2011.  Findings: Heart size is upper normal.  Lungs are clear.  No pneumothorax or pleural effusion.  IMPRESSION: No acute finding.   Original Report Authenticated By: Holley Dexter, M.D.    Dg Thoracic Spine 2 View  02/21/2013  *RADIOLOGY REPORT*  Clinical Data: Mid and low back pain and stiffness.  THORACIC SPINE - 2 VIEW  Comparison: PA and lateral chest 02/18/2013.  Findings: Vertebral body height and alignment are normal. Scattered anterior endplate spurring is noted.  Paraspinous structures are unremarkable.  IMPRESSION: No acute finding.  Mild degenerative change.   Original Report Authenticated By: Holley Dexter, M.D.    Dg Lumbar Spine 2-3 Views  02/21/2013  *RADIOLOGY REPORT*  Clinical Data: Mid and posterior back pain.  LUMBAR SPINE - 2-3 VIEW  Comparison: MRI lumbar spine 07/18/2011 and CT abdomen and pelvis 10/12/2012.  Findings: There is convex left scoliosis, unchanged.  Marked multilevel loss of disc space height is seen.  Autologous fusion of L3-4 is identified.  The patient is status post L4-5 laminectomy. Aorto-iliac stent graft is in place.  Coils in the right internal iliacs are noted.  IMPRESSION:  1.  No acute finding. 2.  Convex left scoliosis and severe multilevel spondylosis. 3.  Status post lower lumbar laminectomy.    Original Report Authenticated By: Holley Dexter, M.D.     Labs: Basic Metabolic Panel:  Recent Labs Lab 02/18/13 2127 02/19/13 0956 02/19/13 1750 02/20/13 0505 02/21/13 0539  NA 126* 122* 120* 123* 122*  K 4.0 3.7 3.4* 3.6 3.7  CL 89* 87* 86* 87* 88*  CO2 29 27 21 25 24   GLUCOSE 104* 132* 144* 95 97  BUN 11 9 10 9 8   CREATININE 0.74 0.70 0.70 0.70 0.75  CALCIUM 9.2 8.8 8.9 8.5 8.8   CBC:  Recent Labs Lab 02/18/13 1351  WBC 5.2  NEUTROABS 3.9  HGB 12.3*  HCT 34.5*  MCV 86.9  PLT 161   Signed:  GHERGHE, COSTIN  [facility] 02/21/2013, 1:10 PM

## 2013-02-21 NOTE — Progress Notes (Signed)
Called [facility] to give report on patient.  Spoke with Counselling psychologist.  Patient is being admitted to room 807 in [facility].  All questions have been answered.  Patient stable at discharge without complaints.  Medications were removed from pharmacy and sent with the patient.  IV has been removed.  Patient's rolling walker is at the desk.  Family will be called to pick it up.  In the meantime, we will label it.

## 2013-02-21 NOTE — Progress Notes (Signed)
Physical Therapy Treatment Patient Details Name: [name] MRN: [id number] DOB: [birth year] Today's Date: 02/21/2013 Time: 1191-4782 PT Time Calculation (min): 26 min  PT Assessment / Plan / Recommendation Comments on Treatment Session  Pt adm with weakness.  Pt making slow progress but still needs ST-SNF to regain independence before return home alone.      Follow Up Recommendations  SNF     Does the patient have the potential to tolerate intense rehabilitation     Barriers to Discharge        Equipment Recommendations  None recommended by PT    Recommendations for Other Services    Frequency Min 3X/week   Plan Discharge plan remains appropriate;Frequency remains appropriate    Precautions / Restrictions Precautions Precautions: Fall Restrictions Weight Bearing Restrictions: No   Pertinent Vitals/Pain Back pain which he reports improved with amb.    Mobility  Bed Mobility Supine to Sit: 4: Min assist;HOB elevated Sitting - Scoot to Edge of Bed: 5: Supervision Transfers Sit to Stand: 4: Min assist;With upper extremity assist;From bed;From toilet Stand to Sit: 4: Min assist;With armrests;To chair/3-in-1;With upper extremity assist;To toilet Details for Transfer Assistance: Assist to control descent and pt continues to hold walker handles despite verbal cues to reach for armrest of chair. Ambulation/Gait Ambulation/Gait Assistance: 4: Min assist Ambulation Distance (Feet): 200 Feet Assistive device: Rollator Ambulation/Gait Assistance Details: verbal cues to stand more erect and stay closer to walker. Gait Pattern: Step-through pattern;Decreased stride length;Trunk flexed;Decreased step length - left;Decreased step length - right Gait velocity: decr    Exercises     PT Diagnosis:    PT Problem List:   PT Treatment Interventions:     PT Goals Acute Rehab PT Goals PT Goal: Supine/Side to Sit - Progress: Progressing toward goal PT Goal: Sit to Stand - Progress:  Progressing toward goal PT Goal: Stand to Sit - Progress: Progressing toward goal PT Goal: Ambulate - Progress: Progressing toward goal  Visit Information  Assistance Needed: +1    Subjective Data  Subjective: Pt states his back feels better after amb.   Cognition  Cognition Overall Cognitive Status: Impaired Area of Impairment: Following commands Arousal/Alertness: Awake/alert Orientation Level: Appears intact for tasks assessed Behavior During Session: [facility] for tasks performed Following Commands: Follows multi-step commands inconsistently Cognition - Other Comments: Slight difficulty following complex commands during gait.    Balance  Static Standing Balance Static Standing - Balance Support: Bilateral upper extremity supported Static Standing - Level of Assistance: 5: Stand by assistance  End of Session PT - End of Session Equipment Utilized During Treatment: Gait belt Activity Tolerance: Patient tolerated treatment well Patient left: in chair;with call bell/phone within reach Nurse Communication: Mobility status   GP     [facility] 02/21/2013, 12:26 PM  [employer] PT [phone]

## 2013-02-28 ENCOUNTER — Encounter: Payer: Self-pay | Admitting: Internal Medicine

## 2013-02-28 ENCOUNTER — Non-Acute Institutional Stay (SKILLED_NURSING_FACILITY): Payer: Medicare Other | Admitting: Internal Medicine

## 2013-02-28 DIAGNOSIS — K219 Gastro-esophageal reflux disease without esophagitis: Secondary | ICD-10-CM

## 2013-02-28 DIAGNOSIS — R531 Weakness: Secondary | ICD-10-CM | POA: Insufficient documentation

## 2013-02-28 DIAGNOSIS — K644 Residual hemorrhoidal skin tags: Secondary | ICD-10-CM | POA: Insufficient documentation

## 2013-02-28 DIAGNOSIS — I2510 Atherosclerotic heart disease of native coronary artery without angina pectoris: Secondary | ICD-10-CM

## 2013-02-28 DIAGNOSIS — R5383 Other fatigue: Secondary | ICD-10-CM

## 2013-02-28 DIAGNOSIS — K5909 Other constipation: Secondary | ICD-10-CM

## 2013-02-28 DIAGNOSIS — Z87898 Personal history of other specified conditions: Secondary | ICD-10-CM

## 2013-02-28 DIAGNOSIS — M129 Arthropathy, unspecified: Secondary | ICD-10-CM

## 2013-02-28 DIAGNOSIS — I10 Essential (primary) hypertension: Secondary | ICD-10-CM

## 2013-02-28 DIAGNOSIS — E871 Hypo-osmolality and hyponatremia: Secondary | ICD-10-CM

## 2013-02-28 NOTE — Progress Notes (Signed)
Patient ID: [name], male   DOB: 1918-01-24, 79 y.o.   MRN: [id number]    PCP: Dorrene German, MD  Code Status: dnr  Allergies  Allergen Reactions  . Hydrocodone Nausea And Vomiting  . Oxycodone Nausea And Vomiting    Chief Complaint: new admit post hospitalization 02/18/13- 02/21/13  HPI:  79 y/o male patient with hx of chronic hyponatremia, htn, cad was admitted to hospital with generalized weakness. Infection workup was negative. PT/OT recommended STR and patient is here for that He was seen in his room today. He complaints of being constipated and feeling the urge to defecate but unable to do so. He denies any abdominal pain, nausea or vomiting. Appetite is good. He has been working with pt/ot and feels some strength is coming back Denies any other complaints  Review of Systems:  Review of Systems  Constitutional: Negative for fever and chills.  Eyes: Negative.   Respiratory: Negative for shortness of breath.   Cardiovascular: Negative for chest pain and palpitations.  Gastrointestinal: Positive for constipation. Negative for heartburn, nausea, vomiting, abdominal pain and blood in stool.  Genitourinary: Negative.   Musculoskeletal: Positive for back pain.  Skin: Negative for itching and rash.  Neurological: Positive for weakness. Negative for focal weakness, seizures and headaches.  Psychiatric/Behavioral: Negative for depression. The patient does not have insomnia.     Past Medical History  Diagnosis Date  . Hypertension   . Hyperlipidemia   . Arthritis   . GERD (gastroesophageal reflux disease)   . Anemia   . Hiatal hernia   . Trouble swallowing   . AAA (abdominal aortic aneurysm)   . Macular degeneration   . Peripheral vascular disease   . CAD (coronary artery disease)    Past Surgical History  Procedure Laterality Date  . Tonsillectomy    . Coronary angioplasty with stent placement    . Back surgery  09-05-2011    . Angioplasty / stenting iliac   03-2011    Right Iliac - Gore Stent graft    Social History:   reports that he quit smoking about 22 years ago. His smoking use included Cigarettes. He smoked 0.00 packs per day. His smokeless tobacco use includes Chew. He reports that he does not drink alcohol or use illicit drugs.  Family History  Problem Relation Age of Onset  . Heart failure Mother   . Diabetes Mother   . Heart disease Mother   . Pneumonia Father   . Cancer Brother   . Diabetes Brother   . Heart disease Brother     Medications: Patient's Medications  New Prescriptions   No medications on file  Previous Medications   ACETAMINOPHEN (TYLENOL) 500 MG TABLET    Take 500 mg by mouth every 6 (six) hours as needed. Pain.   ALFUZOSIN (UROXATRAL) 10 MG 24 HR TABLET    Take 10 mg by mouth daily.     ALPRAZOLAM (XANAX) 0.25 MG TABLET    Take 0.25 mg by mouth at bedtime as needed for sleep.   ALUM & MAG HYDROXIDE-SIMETH (MAGIC MOUTHWASH) SOLN    Take 5 mLs by mouth 4 (four) times daily as needed (for mouth).   AMLODIPINE (NORVASC) 5 MG TABLET    Take 5 mg by mouth daily.     ASPIRIN EC 81 MG TABLET    Take 81 mg by mouth daily.     ATORVASTATIN (LIPITOR) 20 MG TABLET    Take 20 mg by mouth daily.  CETIRIZINE (ZYRTEC) 5 MG TABLET    Take 5 mg by mouth daily as needed for allergies. Allergies.   CLOPIDOGREL (PLAVIX) 75 MG TABLET    Take 75 mg by mouth daily.     FERROUS SULFATE 325 (65 FE) MG TABLET    Take 325 mg by mouth daily with breakfast.   FINASTERIDE (PROSCAR) 5 MG TABLET    Take 5 mg by mouth daily.     GABAPENTIN (NEURONTIN) 100 MG CAPSULE    Take 100 mg by mouth daily.   IPRATROPIUM (ATROVENT) 0.06 % NASAL SPRAY    Place 2 sprays into the nose 2 (two) times daily as needed for rhinitis (for drainage).   METOPROLOL TARTRATE (LOPRESSOR) 25 MG TABLET    Take 12.5 mg by mouth 2 (two) times daily.   MULTIPLE VITAMIN (MULTIVITAMIN PO)    Take 1 tablet by mouth daily.    MULTIPLE VITAMINS-MINERALS (ICAPS MV PO)     Take 1 capsule by mouth at bedtime.    PANTOPRAZOLE (PROTONIX) 40 MG TABLET    Take 40 mg by mouth daily.   POLYETHYLENE GLYCOL 3350 (MIRALAX PO)    Take 17 g by mouth at bedtime. Constipation.   PREGABALIN (LYRICA PO)    Take 1 capsule by mouth at bedtime.    PSYLLIUM (METAMUCIL PO)    Take 1 tablet by mouth every morning.    TRAMADOL (ULTRAM) 50 MG TABLET    Take 1 tablet (50 mg total) by mouth every 6 (six) hours as needed.  Modified Medications   No medications on file  Discontinued Medications   LANSOPRAZOLE (PREVACID) 30 MG CAPSULE    Take 30 mg by mouth daily.       Physical Exam:  Filed Vitals:   02/28/13 1520  BP: 138/80  Pulse: 66  Temp: 98.1 F (36.7 C)  Resp: 18  SpO2: 98%   gen- no acute distress heent- perrla, eomi cvs- normal s1,s2, rrr respi- b/l cta abdo- soft, bs =, nt, nd, no guarding/ rigidity Rectum- external hemorrhoid present, non bleeding Skin- warm, dry, no sores Musculoskeletal- rom intact, weakness present, using wheelchair and walker Neuro- tremor present, aao x 2 place and person  Labs reviewed: CMP     Component Value Date/Time   NA 122* 02/21/2013 0539   K 3.7 02/21/2013 0539   CL 88* 02/21/2013 0539   CO2 24 02/21/2013 0539   GLUCOSE 97 02/21/2013 0539   BUN 8 02/21/2013 0539   CREATININE 0.75 02/21/2013 0539   CREATININE 0.84 10/08/2012 1306   CALCIUM 8.8 02/21/2013 0539   PROT 6.3 08/29/2011 1026   ALBUMIN 3.4* 08/29/2011 1026   AST 20 08/29/2011 1026   ALT 12 08/29/2011 1026   ALKPHOS 74 08/29/2011 1026   BILITOT 0.6 08/29/2011 1026   GFRNONAA 77* 02/21/2013 0539   GFRAA 90* 02/21/2013 0539    CBC    Component Value Date/Time   WBC 5.2 02/18/2013 1351   RBC 3.97* 02/18/2013 1351   HGB 12.3* 02/18/2013 1351   HCT 34.5* 02/18/2013 1351   PLT 161 02/18/2013 1351   MCV 86.9 02/18/2013 1351   MCH 31.0 02/18/2013 1351   MCHC 35.7 02/18/2013 1351   RDW 13.7 02/18/2013 1351   LYMPHSABS 0.7 02/18/2013 1351   MONOABS 0.5 02/18/2013 1351   EOSABS 0.0  02/18/2013 1351   BASOSABS 0.0 02/18/2013 1351   Radiological Exams: reviewed. See imaging section for details   Assessment/Plan CONSTIPATION, CHRONIC Worsened. On miralax and metamucil  at present. Will provide fleet enema x 1 today and have him on senna-s 1 tab bid for now. monitor  External hemorrhoid Has an external hemorrhoid, non bleeding. 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 Reviewed 02/22/13 labs showing na of 120 and it was 122 on discharge from hospital. No change in mental status. No seizure activity reported. Will recheck bmp and if there is further decline in na level, consider salt tablet. Fluid restriction to be continued  Weakness generalized From debility. Will have him undergo therapy with PT/OT. Fall precautions. Encourage po intake.  GERD With lansoprazole being non formulary in facility, pt has been switched to pantoprazole and is tolerating it well. monitor  HYPERTENSION bp remains stable. Will not make changes. Continue current regimen and monitor bmp  CORONARY ARTERY DISEASE Remains chest pain free, continue b blocker, statin and asa  ARTHRITIS Monitor and continue prn tramadol  BENIGN PROSTATIC HYPERTROPHY, HX OF  We will continue to monitor current regimen and make changes as necessary.    Goals of care: to be able to return home/ assisted living   Labs/tests ordered: bmp

## 2013-02-28 NOTE — Assessment & Plan Note (Signed)
bp remains stable. Will not make changes. Continue current regimen and monitor bmp

## 2013-02-28 NOTE — Assessment & Plan Note (Signed)
From debility. Will have him undergo therapy with PT/OT. Fall precautions. Encourage po intake.

## 2013-02-28 NOTE — Assessment & Plan Note (Signed)
Monitor and continue prn tramadol

## 2013-02-28 NOTE — Assessment & Plan Note (Signed)
Remains chest pain free, continue b blocker, statin and asa

## 2013-02-28 NOTE — Assessment & Plan Note (Addendum)
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 Reviewed 02/22/13 labs showing na of 120 and it was 122 on discharge from hospital. No change in mental status. No seizure activity reported. Will recheck bmp and if there is further decline in na level, consider salt tablet. Fluid restriction to be continued

## 2013-02-28 NOTE — Assessment & Plan Note (Signed)
Worsened. On miralax and metamucil at present. Will provide fleet enema x 1 today and have him on senna-s 1 tab bid for now. monitor

## 2013-02-28 NOTE — Assessment & Plan Note (Signed)
With lansoprazole being non formulary in facility, pt has been switched to pantoprazole and is tolerating it well. monitor

## 2013-02-28 NOTE — Assessment & Plan Note (Signed)
  We will continue to monitor current regimen and make changes as necessary.

## 2013-02-28 NOTE — Assessment & Plan Note (Signed)
Has an external hemorrhoid, non bleeding. With chronic constipation that can worsen it, will have him on hydrocortisone/lidocaine topical 1% cream bid for 10 days

## 2013-03-22 ENCOUNTER — Encounter: Payer: Self-pay | Admitting: Nurse Practitioner

## 2013-04-19 ENCOUNTER — Telehealth (HOSPITAL_COMMUNITY): Payer: Self-pay | Admitting: Vascular Surgery

## 2013-04-27 ENCOUNTER — Encounter: Payer: Self-pay | Admitting: Nurse Practitioner

## 2013-05-06 ENCOUNTER — Other Ambulatory Visit: Payer: Self-pay | Admitting: Cardiovascular Disease

## 2013-05-06 NOTE — Telephone Encounter (Signed)
Message forwarded to J.C. Wildman, LPN.  

## 2013-05-06 NOTE — Telephone Encounter (Signed)
Need new prescription for Amolodipine#90 and refills-Call to [facility]-[phone] please!

## 2013-05-13 ENCOUNTER — Other Ambulatory Visit: Payer: Self-pay | Admitting: Cardiovascular Disease

## 2013-05-13 LAB — CBC WITH DIFFERENTIAL/PLATELET
Basophils Absolute: 0 10*3/uL (ref 0.0–0.1)
Basophils Relative: 1 % (ref 0–1)
Eosinophils Absolute: 0.1 10*3/uL (ref 0.0–0.7)
Eosinophils Relative: 2 % (ref 0–5)
MCH: 30.8 pg (ref 26.0–34.0)
MCV: 90.4 fL (ref 78.0–100.0)
Neutrophils Relative %: 69 % (ref 43–77)
Platelets: 143 10*3/uL — ABNORMAL LOW (ref 150–400)
RDW: 14.6 % (ref 11.5–15.5)

## 2013-05-13 LAB — COMPREHENSIVE METABOLIC PANEL
AST: 22 U/L (ref 0–37)
Alkaline Phosphatase: 64 U/L (ref 39–117)
BUN: 16 mg/dL (ref 6–23)
Calcium: 8.9 mg/dL (ref 8.4–10.5)
Creat: 0.86 mg/dL (ref 0.50–1.35)
Total Bilirubin: 0.6 mg/dL (ref 0.3–1.2)

## 2013-05-13 MED ORDER — AMLODIPINE BESYLATE 5 MG PO TABS: 5.0000 mg | ORAL_TABLET | Freq: Every day | ORAL | Status: DC

## 2013-05-13 NOTE — Telephone Encounter (Signed)
Pt. To be seen in [place] today will refill then

## 2013-05-18 ENCOUNTER — Other Ambulatory Visit (HOSPITAL_COMMUNITY): Payer: Self-pay | Admitting: Cardiovascular Disease

## 2013-05-18 DIAGNOSIS — T82330A Leakage of aortic (bifurcation) graft (replacement), initial encounter: Secondary | ICD-10-CM

## 2013-05-19 ENCOUNTER — Encounter: Payer: Self-pay | Admitting: Cardiovascular Disease

## 2013-05-20 ENCOUNTER — Other Ambulatory Visit: Payer: Self-pay | Admitting: Cardiovascular Disease

## 2013-05-21 LAB — BASIC METABOLIC PANEL
CO2: 25 mEq/L (ref 19–32)
Chloride: 95 mEq/L — ABNORMAL LOW (ref 96–112)
Glucose, Bld: 109 mg/dL — ABNORMAL HIGH (ref 70–99)
Potassium: 4.5 mEq/L (ref 3.5–5.3)
Sodium: 128 mEq/L — ABNORMAL LOW (ref 135–145)

## 2013-05-21 LAB — OSMOLALITY: Osmolality: 271 mOsm/kg — ABNORMAL LOW (ref 275–300)

## 2013-06-09 ENCOUNTER — Other Ambulatory Visit: Payer: Self-pay | Admitting: Cardiovascular Disease

## 2013-06-10 LAB — BASIC METABOLIC PANEL
Glucose, Bld: 143 mg/dL — ABNORMAL HIGH (ref 70–99)
Potassium: 3.9 mEq/L (ref 3.5–5.3)
Sodium: 135 mEq/L (ref 135–145)

## 2013-06-24 ENCOUNTER — Ambulatory Visit (HOSPITAL_COMMUNITY)
Admission: RE | Admit: 2013-06-24 | Discharge: 2013-06-24 | Disposition: A | Discharge status: Home / Self Care | Payer: Medicare Other | Source: Ambulatory Visit | Attending: Cardiovascular Disease | Admitting: Cardiovascular Disease

## 2013-06-24 DIAGNOSIS — I6529 Occlusion and stenosis of unspecified carotid artery: Secondary | ICD-10-CM | POA: Insufficient documentation

## 2013-06-24 DIAGNOSIS — T82330A Leakage of aortic (bifurcation) graft (replacement), initial encounter: Secondary | ICD-10-CM

## 2013-06-24 DIAGNOSIS — G459 Transient cerebral ischemic attack, unspecified: Secondary | ICD-10-CM

## 2013-06-24 DIAGNOSIS — Z09 Encounter for follow-up examination after completed treatment for conditions other than malignant neoplasm: Secondary | ICD-10-CM | POA: Insufficient documentation

## 2013-06-24 NOTE — Progress Notes (Signed)
Carotid Duplex Completed. Kazia Grisanti, RDMS, RVT  

## 2013-06-24 NOTE — Progress Notes (Signed)
Carotid Duplex Completed. [name], RDMS, RVT  

## 2013-07-26 ENCOUNTER — Telehealth: Payer: Self-pay | Admitting: Cardiovascular Disease

## 2013-07-26 NOTE — Telephone Encounter (Signed)
Ref#-[id number]-Need to verify prescription dated June 6th -look like prescription was altered-need to know what the original one said.

## 2013-07-26 NOTE — Telephone Encounter (Signed)
Rx written for Metoprolol ER 25mg  1/2 tab po bid #90 w/2 refills

## 2013-08-01 NOTE — Progress Notes (Signed)
This encounter was created in error - please disregard.

## 2013-08-07 ENCOUNTER — Emergency Department (HOSPITAL_COMMUNITY): Payer: Medicare Other

## 2013-08-07 ENCOUNTER — Emergency Department (HOSPITAL_COMMUNITY)
Admission: EM | Admit: 2013-08-07 | Discharge: 2013-08-07 | Disposition: A | Discharge status: Home / Self Care | Payer: Medicare Other | Attending: Emergency Medicine | Admitting: Emergency Medicine

## 2013-08-07 ENCOUNTER — Encounter (HOSPITAL_COMMUNITY): Payer: Self-pay | Admitting: *Deleted

## 2013-08-07 DIAGNOSIS — Z7902 Long term (current) use of antithrombotics/antiplatelets: Secondary | ICD-10-CM | POA: Insufficient documentation

## 2013-08-07 DIAGNOSIS — D649 Anemia, unspecified: Secondary | ICD-10-CM | POA: Insufficient documentation

## 2013-08-07 DIAGNOSIS — M129 Arthropathy, unspecified: Secondary | ICD-10-CM | POA: Insufficient documentation

## 2013-08-07 DIAGNOSIS — Z8669 Personal history of other diseases of the nervous system and sense organs: Secondary | ICD-10-CM | POA: Insufficient documentation

## 2013-08-07 DIAGNOSIS — R1031 Right lower quadrant pain: Secondary | ICD-10-CM | POA: Insufficient documentation

## 2013-08-07 DIAGNOSIS — Z7982 Long term (current) use of aspirin: Secondary | ICD-10-CM | POA: Insufficient documentation

## 2013-08-07 DIAGNOSIS — Z87891 Personal history of nicotine dependence: Secondary | ICD-10-CM | POA: Insufficient documentation

## 2013-08-07 DIAGNOSIS — I10 Essential (primary) hypertension: Secondary | ICD-10-CM | POA: Insufficient documentation

## 2013-08-07 DIAGNOSIS — Z8679 Personal history of other diseases of the circulatory system: Secondary | ICD-10-CM | POA: Insufficient documentation

## 2013-08-07 DIAGNOSIS — Z9861 Coronary angioplasty status: Secondary | ICD-10-CM | POA: Insufficient documentation

## 2013-08-07 DIAGNOSIS — E785 Hyperlipidemia, unspecified: Secondary | ICD-10-CM | POA: Insufficient documentation

## 2013-08-07 DIAGNOSIS — Z8719 Personal history of other diseases of the digestive system: Secondary | ICD-10-CM | POA: Insufficient documentation

## 2013-08-07 DIAGNOSIS — I2510 Atherosclerotic heart disease of native coronary artery without angina pectoris: Secondary | ICD-10-CM | POA: Insufficient documentation

## 2013-08-07 DIAGNOSIS — Z79899 Other long term (current) drug therapy: Secondary | ICD-10-CM | POA: Insufficient documentation

## 2013-08-07 DIAGNOSIS — K219 Gastro-esophageal reflux disease without esophagitis: Secondary | ICD-10-CM | POA: Insufficient documentation

## 2013-08-07 DIAGNOSIS — R269 Unspecified abnormalities of gait and mobility: Secondary | ICD-10-CM | POA: Insufficient documentation

## 2013-08-07 DIAGNOSIS — R109 Unspecified abdominal pain: Secondary | ICD-10-CM

## 2013-08-07 LAB — CBC WITH DIFFERENTIAL/PLATELET
Eosinophils Absolute: 0.1 10*3/uL (ref 0.0–0.7)
Eosinophils Relative: 1 % (ref 0–5)
Hemoglobin: 12.4 g/dL — ABNORMAL LOW (ref 13.0–17.0)
Lymphs Abs: 1 10*3/uL (ref 0.7–4.0)
MCH: 32.5 pg (ref 26.0–34.0)
MCV: 94.5 fL (ref 78.0–100.0)
Monocytes Relative: 10 % (ref 3–12)
Neutrophils Relative %: 68 % (ref 43–77)
RBC: 3.81 MIL/uL — ABNORMAL LOW (ref 4.22–5.81)

## 2013-08-07 LAB — URINALYSIS, ROUTINE W REFLEX MICROSCOPIC
Bilirubin Urine: NEGATIVE
Hgb urine dipstick: NEGATIVE
Ketones, ur: NEGATIVE mg/dL
Protein, ur: NEGATIVE mg/dL
Urobilinogen, UA: 0.2 mg/dL (ref 0.0–1.0)

## 2013-08-07 LAB — COMPREHENSIVE METABOLIC PANEL
Alkaline Phosphatase: 65 U/L (ref 39–117)
BUN: 20 mg/dL (ref 6–23)
CO2: 29 mEq/L (ref 19–32)
GFR calc Af Amer: 83 mL/min — ABNORMAL LOW (ref >90)
GFR calc non Af Amer: 72 mL/min — ABNORMAL LOW (ref >90)
Glucose, Bld: 119 mg/dL — ABNORMAL HIGH (ref 70–99)
Potassium: 3.9 mEq/L (ref 3.5–5.1)
Total Protein: 6.4 g/dL (ref 6.0–8.3)

## 2013-08-07 LAB — LIPASE, BLOOD: Lipase: 32 U/L (ref 11–59)

## 2013-08-07 MED ORDER — IOHEXOL 300 MG/ML  SOLN
100.0000 mL | Freq: Once | INTRAMUSCULAR | Status: AC | PRN
  Administered 2013-08-07: 100 mL via INTRAVENOUS

## 2013-08-07 MED ORDER — IOHEXOL 300 MG/ML  SOLN
25.0000 mL | INTRAMUSCULAR | Status: DC
Start: 2013-08-07 — End: 2013-08-08
  Administered 2013-08-07: 25 mL via ORAL

## 2013-08-07 MED ORDER — SODIUM CHLORIDE 0.9 % IV BOLUS (SEPSIS)
500.0000 mL | Freq: Once | INTRAVENOUS | Status: AC
  Administered 2013-08-07: 500 mL via INTRAVENOUS

## 2013-08-07 NOTE — ED Provider Notes (Signed)
CSN: [id number]     Arrival date & time 08/07/13  1340 History   First MD Initiated Contact with Patient 08/07/13 1358     Chief Complaint  Patient presents with  . Abdominal Pain   (Consider location/radiation/quality/duration/timing/severity/associated sxs/prior Treatment) HPI  79 year old male presents emergency department with chief complaint of abdominal pain.  Patient has a long-standing history of peripheral vascular disease with previous iliac stenting.  The patient complains of right lower quadrant abdominal pain.  He states that last night he had a "twinge" of pain .  This morning he awoke and his pain became more severe and constant.  Nothing makes it worse and nothing makes it better.  Patient does have a history of kidney stones but states that this is unlike any kidney stones ever had.  He is rates the pain at 4/10.  He denies any radiation down the legs, he denies any urinary symptoms or back pain.  The patient denies any nausea, vomiting, chills, shortness of breath or chest pain.  He denies any symptoms of presyncope.  Patient does have his appendix present.  Past Medical History  Diagnosis Date  . Hypertension   . Hyperlipidemia   . Arthritis   . GERD (gastroesophageal reflux disease)   . Anemia   . Hiatal hernia   . Trouble swallowing   . AAA (abdominal aortic aneurysm)   . Macular degeneration   . Peripheral vascular disease   . CAD (coronary artery disease)    Past Surgical History  Procedure Laterality Date  . Tonsillectomy    . Coronary angioplasty with stent placement    . Back surgery  09-05-2011    . Angioplasty / stenting iliac  03-2011    Right Iliac - Gore Stent graft    Family History  Problem Relation Age of Onset  . Heart failure Mother   . Diabetes Mother   . Heart disease Mother   . Pneumonia Father   . Cancer Brother   . Diabetes Brother   . Heart disease Brother    History  Substance Use Topics  . Smoking status: Former Smoker   Types: Cigarettes    Quit date: 12/08/1990  . Smokeless tobacco: Current User    Types: Chew  . Alcohol Use: No    Review of Systems  Constitutional: Negative for fever and chills.  Respiratory: Negative for cough and shortness of breath.   Cardiovascular: Negative for chest pain and palpitations.  Gastrointestinal: Positive for abdominal pain. Negative for vomiting, diarrhea and constipation.  Genitourinary: Negative for dysuria, urgency and frequency.  Musculoskeletal: Positive for gait problem (WALKS WITH WALKER). Negative for myalgias and arthralgias.  Skin: Negative for rash.  Neurological: Negative for syncope, light-headedness and headaches.    Allergies  Hydrocodone and Oxycodone  Home Medications   Current Outpatient Rx  Name  Route  Sig  Dispense  Refill  . acetaminophen (TYLENOL) 500 MG tablet   Oral   Take 1,000 mg by mouth every 6 (six) hours as needed. Pain.         Marland Kitchen alfuzosin (UROXATRAL) 10 MG 24 hr tablet   Oral   Take 10 mg by mouth daily.           Marland Kitchen ALPRAZolam (XANAX) 0.25 MG tablet   Oral   Take 0.25 mg by mouth 2 (two) times daily as needed for sleep or anxiety.          Marland Kitchen aspirin EC 81 MG tablet   Oral  Take 81 mg by mouth daily.           Marland Kitchen atorvastatin (LIPITOR) 20 MG tablet   Oral   Take 20 mg by mouth daily.           . cetirizine (ZYRTEC) 5 MG tablet   Oral   Take 5 mg by mouth daily as needed for allergies. Allergies.         Marland Kitchen clopidogrel (PLAVIX) 75 MG tablet   Oral   Take 75 mg by mouth daily.           . ferrous sulfate 325 (65 FE) MG tablet   Oral   Take 325 mg by mouth daily with breakfast.         . finasteride (PROSCAR) 5 MG tablet   Oral   Take 5 mg by mouth daily.           . metoprolol tartrate (LOPRESSOR) 25 MG tablet   Oral   Take 12.5 mg by mouth 2 (two) times daily.         . Multiple Vitamin (MULTIVITAMIN PO)   Oral   Take 1 tablet by mouth daily.          . Multiple  Vitamins-Minerals (ICAPS MV PO)   Oral   Take 1 capsule by mouth at bedtime.          Marland Kitchen omeprazole (PRILOSEC) 20 MG capsule   Oral   Take 20 mg by mouth daily.         . Polyethylene Glycol 3350 (MIRALAX PO)   Oral   Take 17 g by mouth at bedtime. Constipation.         . pregabalin (LYRICA) 50 MG capsule   Oral   Take 50 mg by mouth at bedtime.         . Psyllium (METAMUCIL PO)   Oral   Take 1 tablet by mouth every morning.           BP 180/65  Pulse 65  Temp(Src) 98.5 F (36.9 C) (Oral)  Resp 18  SpO2 96% Physical Exam Physical Exam  Nursing note and vitals reviewed. Constitutional: He appears well-developed and well-nourished. No distress.  HENT:  Head: Normocephalic and atraumatic.  Eyes: Conjunctivae normal are normal. No scleral icterus.  Neck: Normal range of motion. Neck supple.  Cardiovascular: Normal rate, regular rhythm and normal heart sounds.   Palpable femoral pulses BL. Monophasic Pulse in the the DP/PT BL with doppler. No peripheral edema. Pulmonary/Chest: Effort normal and breath sounds normal. No respiratory distress.  Abdominal: Soft.  Tender to palpation in the right lower quadrant of the abdomen.  No rebound tenderness, guarding.   Musculoskeletal: He exhibits no edema.  Neurological: He is alert.  Skin: Skin is warm and dry. He is not diaphoretic.  Psychiatric: His behavior is normal.    ED Course  Procedures (including critical care time) Labs Review Labs Reviewed  CBC WITH DIFFERENTIAL - Abnormal; Notable for the following:    RBC 3.81 (*)    Hemoglobin 12.4 (*)    HCT 36.0 (*)    Platelets 127 (*)    All other components within normal limits  COMPREHENSIVE METABOLIC PANEL - Abnormal; Notable for the following:    Glucose, Bld 119 (*)    GFR calc non Af Amer 72 (*)    GFR calc Af Amer 83 (*)    All other components within normal limits  LIPASE, BLOOD  TROPONIN I  Imaging Review No results found.  Date: 08/07/2013   Rate: 55  Rhythm: normal sinus rhythm  QRS Axis: normal  Intervals: normal  ST/T Wave abnormalities: normal  Conduction Disutrbances:none  Narrative Interpretation:   Old EKG Reviewed: improved form previous 18 Feb 2013   MDM  No diagnosis found. 3:11 PM Filed Vitals:   08/07/13 1344  BP: 180/65  Pulse: 65  Temp: 98.5 F (36.9 C)  Resp: 18   Patient here with chief complaint of abdominal pain.  Patient states he thinks it is his iliac graft.  He has no other symptoms at this time aside from pain right over the iliac artery on the right.  Mildly elevated glucose, and BUN and creatinine normal.  Normal troponin, EKG is pending.  His labs otherwise look well.  Patient is on fluid restriction to 2 recurrent hyponatremia.  I have ordered CT angiogram done and pelvis.  He will get 500 cc bolus for kidney protection due to his age and decreased GFR. EKG shows no signs of acute ischemia by my interpretation  Patient CT angio pending. I have given report to Dr. Fredderick Phenix  And resident Dr. Meryle Ready will assume care of the patient. Discharge home pending no abnormalities on CT or  UA.     Arthor Captain, PA-C 08/07/13 1615

## 2013-08-07 NOTE — ED Provider Notes (Signed)
Assumed Care from Arthor Captain PA-C please refer to her note for HPI and MDM up until care transfer  7:45 PM Pt is a 79 y.o. male with pertinent PMHX of HTN,HLD,GERD, AAA EVAR 2012, CAD, PVD who presents to the ED with bilateral groin pain. Given RLQ abdominal pain and concern for possible graft failure, appendicitis and other intra-abdominal pathology will obtain CT abdomen pelvis w contrast. Discussed briefly with Vascular surgery who agrees with plan to obtain Ct abdomen and pelvis w contrast.  On my exam, normal pulses distally normal sensation and motor distal extremities. Warm well perfused. No clinical evidence of ischemia to lower extremities. Mild TTP RLQ no peritoneal signs.  Negative CT abdomen pelvis w contrast. No appendicitis. No Inflammatory bowel disease. Stable grafts. Plan for discharge with follow up with PCP and vascular surgeon as needed  9:09 PM: I have discussed the diagnosis/risks/treatment options with the patient and believe the pt to be eligible for discharge home to follow-up with PCP, vascular surgery as needed. We also discussed returning to the ED immediately if new or worsening sx occur. We discussed the sx which are most concerning (e.g., worsening abdominal pain, chest pain, cold foot) that necessitate immediate return. Any new prescriptions provided to the patient are listed below.   New Prescriptions   No medications on file    The patient appears reasonably screened and/or stabilized for discharge and I doubt any other medical condition or other [facility] requiring further screening, evaluation or treatment in the ED at this time prior to discharge . Pt in agreement with discharge plan. Return precautions given. Pt discharged VSS  Pt was discussed with my attending, Dr. Fredderick Phenix Clinical Impression: 1. Abdominal pain  Fredirick Lathe, MD 08/08/13 [phone]

## 2013-08-07 NOTE — ED Notes (Signed)
Pt arrived by [facility] for abd pain that radiates into bilateral groin. Pt informed ems that pain started yesterday but was relieved with tylenol.

## 2013-08-07 NOTE — ED Provider Notes (Signed)
Medical screening examination/treatment/procedure(s) were conducted as a shared visit with non-physician practitioner(s) and myself.  I personally evaluated the patient during the encounter  Evaluated patient.  Nontoxic.  Abdominal exam benign, peripheral pulses distal to graphs.  EKG and labwork reviewed.  Given history of grafts and PVD, patient will get CTA of abdomen to evaluate grafts.  Disposition per imaging results.  Shon Baton, MD 08/07/13 [phone]

## 2013-08-09 NOTE — ED Provider Notes (Signed)
Pt was seen with MLP by prior EDP, reviewed the resident's note and I agree with the findings and plan.   Rolan Bucco, MD 08/09/13 [phone]

## 2013-11-29 ENCOUNTER — Other Ambulatory Visit (HOSPITAL_COMMUNITY): Payer: Self-pay | Admitting: Cardiovascular Disease

## 2013-11-29 DIAGNOSIS — IMO0001 Reserved for inherently not codable concepts without codable children: Secondary | ICD-10-CM

## 2013-11-29 DIAGNOSIS — T82330A Leakage of aortic (bifurcation) graft (replacement), initial encounter: Secondary | ICD-10-CM

## 2013-12-16 ENCOUNTER — Encounter: Payer: Self-pay | Admitting: Cardiovascular Disease

## 2013-12-16 ENCOUNTER — Ambulatory Visit (HOSPITAL_COMMUNITY)
Admission: RE | Admit: 2013-12-16 | Discharge: 2013-12-16 | Disposition: A | Discharge status: Home / Self Care | Payer: Medicare Other | Source: Ambulatory Visit | Attending: Cardiovascular Disease | Admitting: Cardiovascular Disease

## 2013-12-16 ENCOUNTER — Ambulatory Visit (INDEPENDENT_AMBULATORY_CARE_PROVIDER_SITE_OTHER): Payer: Medicare Other | Admitting: Cardiovascular Disease

## 2013-12-16 VITALS — BP 150/70 | HR 53 | Ht 70.0 in | Wt 155.0 lb

## 2013-12-16 DIAGNOSIS — Z79899 Other long term (current) drug therapy: Secondary | ICD-10-CM

## 2013-12-16 DIAGNOSIS — E785 Hyperlipidemia, unspecified: Secondary | ICD-10-CM

## 2013-12-16 DIAGNOSIS — I714 Abdominal aortic aneurysm, without rupture, unspecified: Secondary | ICD-10-CM

## 2013-12-16 DIAGNOSIS — I2510 Atherosclerotic heart disease of native coronary artery without angina pectoris: Secondary | ICD-10-CM

## 2013-12-16 DIAGNOSIS — R531 Weakness: Secondary | ICD-10-CM

## 2013-12-16 DIAGNOSIS — T82330A Leakage of aortic (bifurcation) graft (replacement), initial encounter: Secondary | ICD-10-CM

## 2013-12-16 DIAGNOSIS — I672 Cerebral atherosclerosis: Secondary | ICD-10-CM | POA: Insufficient documentation

## 2013-12-16 DIAGNOSIS — I6529 Occlusion and stenosis of unspecified carotid artery: Secondary | ICD-10-CM

## 2013-12-16 DIAGNOSIS — E7800 Pure hypercholesterolemia, unspecified: Secondary | ICD-10-CM

## 2013-12-16 DIAGNOSIS — I10 Essential (primary) hypertension: Secondary | ICD-10-CM

## 2013-12-16 DIAGNOSIS — IMO0001 Reserved for inherently not codable concepts without codable children: Secondary | ICD-10-CM

## 2013-12-16 NOTE — Assessment & Plan Note (Signed)
Status post endoluminal stent graft in by Dr. Sherren Mocha early which he follows in his office noninvasively. Apparently there was a type II endoleak which has remained stable

## 2013-12-16 NOTE — Progress Notes (Signed)
Carotid duplex completed. GMG 

## 2013-12-16 NOTE — Assessment & Plan Note (Signed)
Status post RCA stenting by Dr. Rollene Fare 05/30/05 with a Taxus drug-eluting stent. He had normal LV function and moderate circumflex coronary artery disease. His last Myoview stress test performed 01/16/11 was nonischemic.he denies chest pain or shortness of breath.

## 2013-12-16 NOTE — Assessment & Plan Note (Signed)
Well-controlled on current medications 

## 2013-12-16 NOTE — Assessment & Plan Note (Signed)
On statin therapy. We will recheck a lipid and liver profile 

## 2013-12-16 NOTE — Progress Notes (Signed)
12/16/2013 [name]   1918-11-24  [id number]  Primary Physician Philis Fendt, MD Primary Cardiologist: Lorretta Harp MD Renae Gloss   HPI:  [name] is a 80 year old widowed Caucasian male father of one daughter who is accompanying him today. He was formally patient of Dr. Lowella Fairy. I am assuming his care. He is retired from [employer] as a Teacher, early years/pre. His daughter lives next-door to him. The patient lives independently. He has a history of remote tobacco abuse, 2 hypertension and hyperlipidemia. He has not had a heart attack or stroke. He does have known coronary artery disease status post RCA stenting using a Taxus drug-eluting stent by Dr. Rollene Fare back in 2006 with moderate circumflex disease and normal LV function. He is negative Myoview stress test several years ago and denies chest pain or shortness of breath he does have a history of abdominal aortic aneurysm and hasn't had any luminal stent grafting by Dr. Sherren Mocha early pauses as well as an outpatient. He also has asymptomatic moderate left internal carotid artery stenosis which we followed by duplex ultrasound   Current Outpatient Prescriptions  Medication Sig Dispense Refill  . acetaminophen (TYLENOL) 500 MG tablet Take 1,000 mg by mouth every 6 (six) hours as needed. Pain.      Marland Kitchen alfuzosin (UROXATRAL) 10 MG 24 hr tablet Take 10 mg by mouth daily.        Marland Kitchen ALPRAZolam (XANAX) 0.25 MG tablet Take 0.25 mg by mouth 2 (two) times daily as needed for sleep or anxiety.       Marland Kitchen aspirin EC 81 MG tablet Take 81 mg by mouth daily.        Marland Kitchen atorvastatin (LIPITOR) 20 MG tablet Take 20 mg by mouth daily.        . cetirizine (ZYRTEC) 5 MG tablet Take 5 mg by mouth daily as needed for allergies. Allergies.      Marland Kitchen clopidogrel (PLAVIX) 75 MG tablet Take 75 mg by mouth daily.        . ferrous sulfate 325 (65 FE) MG tablet Take 325 mg by mouth daily with breakfast.      . finasteride (PROSCAR) 5 MG tablet Take 5 mg by mouth  daily.        . lansoprazole (PREVACID) 30 MG capsule Take 30 mg by mouth daily.      . metoprolol succinate (TOPROL-XL) 25 MG 24 hr tablet Take 12.5 mg by mouth 2 (two) times daily.      . Multiple Vitamin (MULTIVITAMIN PO) Take 1 tablet by mouth daily.       . Multiple Vitamins-Minerals (ICAPS MV PO) Take 1 capsule by mouth at bedtime.       . Polyethylene Glycol 3350 (MIRALAX PO) Take 17 g by mouth at bedtime. Constipation.      . pregabalin (LYRICA) 50 MG capsule Take 50 mg by mouth at bedtime.      . Psyllium (METAMUCIL PO) Take 1 tablet by mouth every morning.        No current facility-administered medications for this visit.    Allergies  Allergen Reactions  . Hydrocodone Nausea And Vomiting  . Oxycodone Nausea And Vomiting    History   Social History  . Marital Status: Widowed    Spouse Name: N/A    Number of Children: N/A  . Years of Education: N/A   Occupational History  . Not on file.   Social History Main Topics  . Smoking status: Former Smoker  Types: Cigarettes    Quit date: 12/08/1990  . Smokeless tobacco: Current User    Types: Chew  . Alcohol Use: No  . Drug Use: No  . Sexual Activity: Not on file   Other Topics Concern  . Not on file   Social History Narrative  . No narrative on file     Review of Systems: General: negative for chills, fever, night sweats or weight changes.  Cardiovascular: negative for chest pain, dyspnea on exertion, edema, orthopnea, palpitations, paroxysmal nocturnal dyspnea or shortness of breath Dermatological: negative for rash Respiratory: negative for cough or wheezing Urologic: negative for hematuria Abdominal: negative for nausea, vomiting, diarrhea, bright red blood per rectum, melena, or hematemesis Neurologic: negative for visual changes, syncope, or dizziness All other systems reviewed and are otherwise negative except as noted above.    Blood pressure 150/70, pulse 53, height 5\' 10"  (1.778 m), weight 155  lb (70.308 kg).  General appearance: alert and no distress Neck: no adenopathy, no JVD, supple, symmetrical, trachea midline, thyroid not enlarged, symmetric, no tenderness/mass/nodules and soft left carotid bruit Lungs: clear to auscultation bilaterally Heart: regular rate and rhythm, S1, S2 normal, no murmur, click, rub or gallop Abdomen: soft, non-tender; bowel sounds normal; no masses,  no organomegaly Extremities: extremities normal, atraumatic, no cyanosis or edema and venous stasis dermatitis noted  EKG sinus bradycardia at 53 with ST or T wave changes  ASSESSMENT AND PLAN:   CORONARY ARTERY DISEASE Status post RCA stenting by Dr. Rollene Fare 05/30/05 with a Taxus drug-eluting stent. He had normal LV function and moderate circumflex coronary artery disease. His last Myoview stress test performed 01/16/11 was nonischemic.he denies chest pain or shortness of breath.   HYPERTENSION Well controlled on current medications  HYPERCHOLESTEROLEMIA On statin therapy. We will recheck a lipid and liver profile  Abdominal aneurysm without mention of rupture Status post endoluminal stent graft in by Dr. Sherren Mocha early which he follows in his office noninvasively. Apparently there was a type II endoleak which has remained stable      Lorretta Harp MD [facility], [facility] 12/16/2013 11:24 AM

## 2013-12-16 NOTE — Patient Instructions (Signed)
  We will see you back in follow up in 6 months with an extender and 1 year with Dr Berry  Dr Berry has ordered blood work to be done fasting.    

## 2013-12-17 LAB — COMPLETE METABOLIC PANEL WITH GFR
ALBUMIN: 3.9 g/dL (ref 3.5–5.2)
ALT: 13 U/L (ref 0–53)
AST: 23 U/L (ref 0–37)
Alkaline Phosphatase: 63 U/L (ref 39–117)
BUN: 16 mg/dL (ref 6–23)
CALCIUM: 9.1 mg/dL (ref 8.4–10.5)
CHLORIDE: 98 meq/L (ref 96–112)
CO2: 29 mEq/L (ref 19–32)
Creat: 0.87 mg/dL (ref 0.50–1.35)
GFR, Est African American: 86 mL/min
GFR, Est Non African American: 74 mL/min
Glucose, Bld: 94 mg/dL (ref 70–99)
POTASSIUM: 4.4 meq/L (ref 3.5–5.3)
Sodium: 136 mEq/L (ref 135–145)
TOTAL PROTEIN: 6.4 g/dL (ref 6.0–8.3)
Total Bilirubin: 0.9 mg/dL (ref 0.3–1.2)

## 2013-12-17 LAB — LIPID PANEL
Cholesterol: 151 mg/dL (ref 0–200)
HDL: 45 mg/dL (ref >39)
LDL CALC: 90 mg/dL (ref 0–99)
TRIGLYCERIDES: 81 mg/dL (ref <150)
Total CHOL/HDL Ratio: 3.4 Ratio
VLDL: 16 mg/dL (ref 0–40)

## 2013-12-19 ENCOUNTER — Encounter: Payer: Self-pay | Admitting: *Deleted

## 2013-12-21 ENCOUNTER — Encounter: Payer: Self-pay | Admitting: *Deleted

## 2013-12-21 ENCOUNTER — Telehealth: Payer: Self-pay | Admitting: *Deleted

## 2013-12-21 DIAGNOSIS — I6529 Occlusion and stenosis of unspecified carotid artery: Secondary | ICD-10-CM

## 2013-12-21 NOTE — Telephone Encounter (Signed)
Order placed for repeat carotid doppler in 1 year 

## 2013-12-21 NOTE — Telephone Encounter (Signed)
Message copied by Chauncy Lean on Wed Dec 21, 2013 12:42 PM ------      Message from: Lorretta Harp      Created: Wed Dec 21, 2013  9:43 AM       No change from prior study. Repeat in 12 months. ------

## 2014-01-04 ENCOUNTER — Telehealth: Payer: Self-pay | Admitting: Vascular Surgery

## 2014-01-04 NOTE — Telephone Encounter (Addendum)
Message copied by Gena Fray on Wed Jan 04, 2014  3:20 PM ------      Message from: Merleen Nicely      Created: Tue Jan 03, 2014 10:42 AM      Regarding: question about appointment      Contact: [phone]       Ozil Stettler called re her dad Abimael. She was wondering if the appointment in July is the same as the one in November?            Thanks      Ebony Hail ------  01/04/14: no answer, left message for Paulette to call back, dpm

## 2014-05-08 IMAGING — CR DG CHEST 2V
3 series · 3 of 3 positions shown · non-contrast
Comparison: Single view of the chest 11/12/2012 and 03/10/2011.

CLINICAL DATA: Weakness.

CHEST - 2 VIEW

[x chest ap]
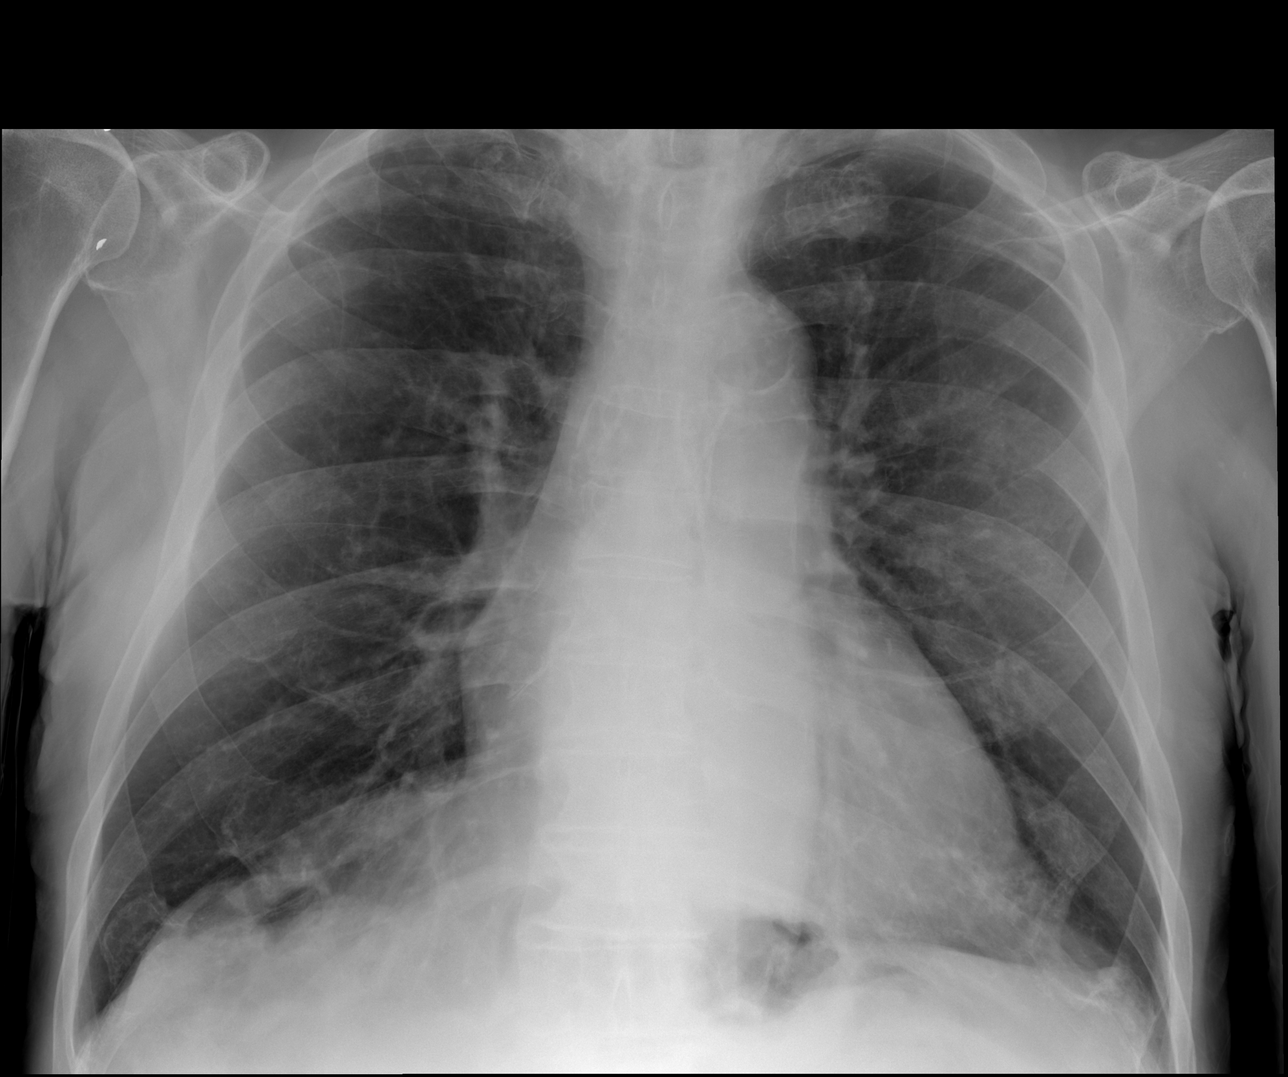

[w chest lat (1 of 2)]
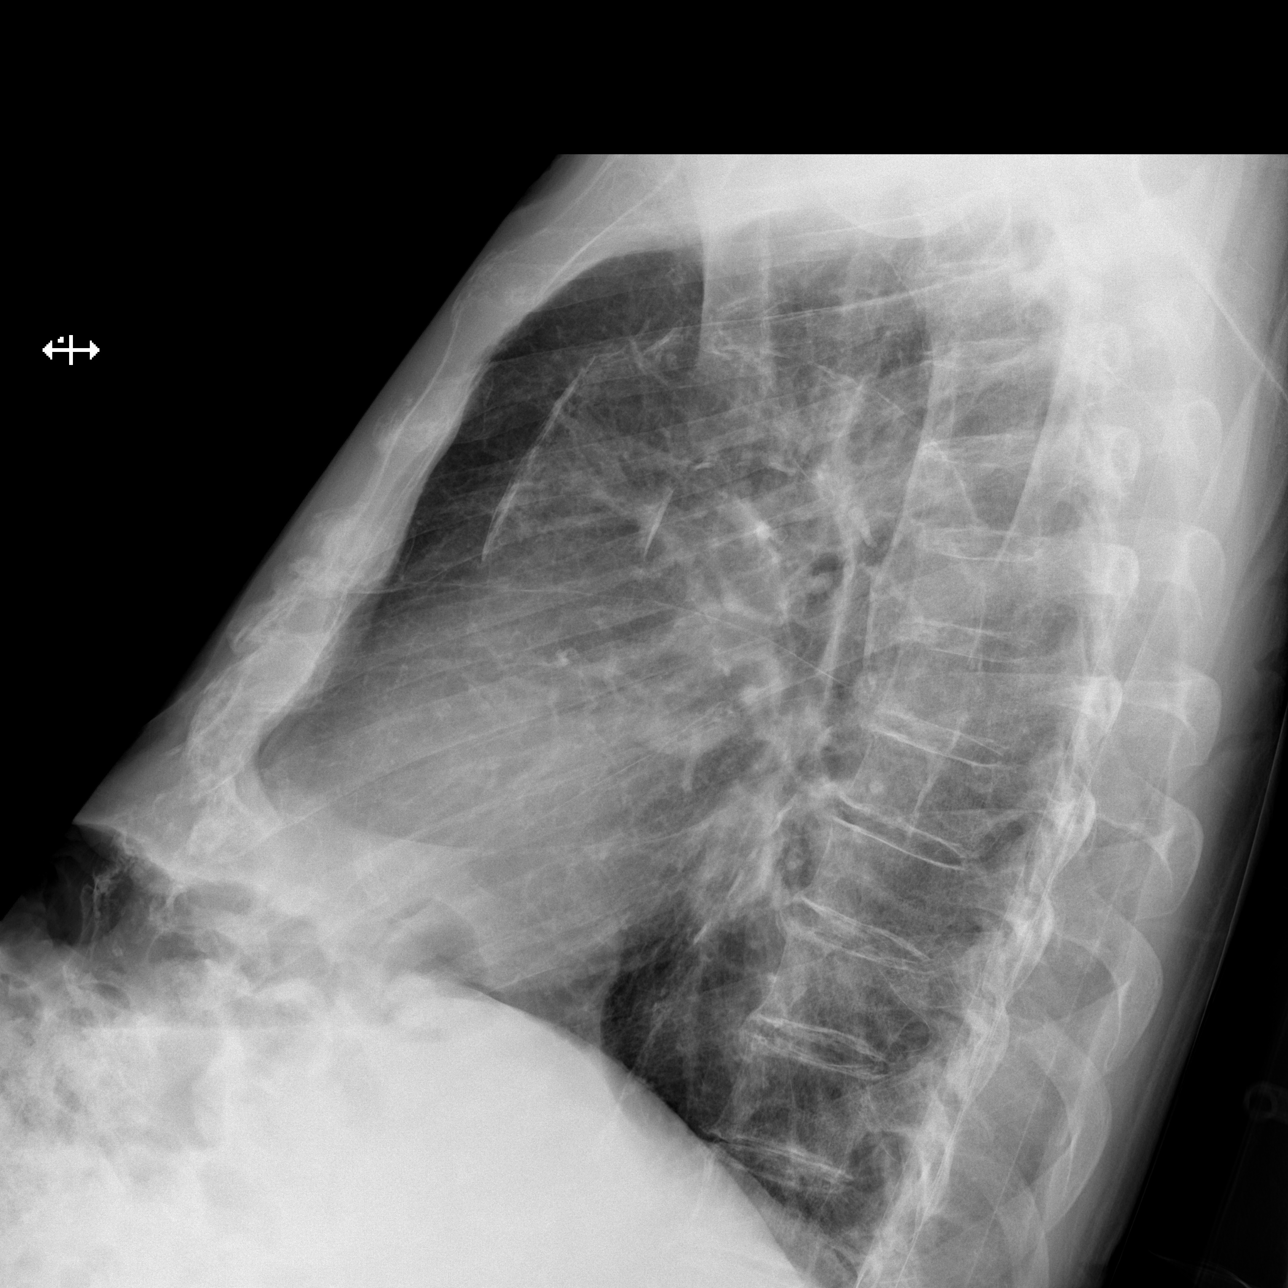

[w chest lat (2 of 2)]
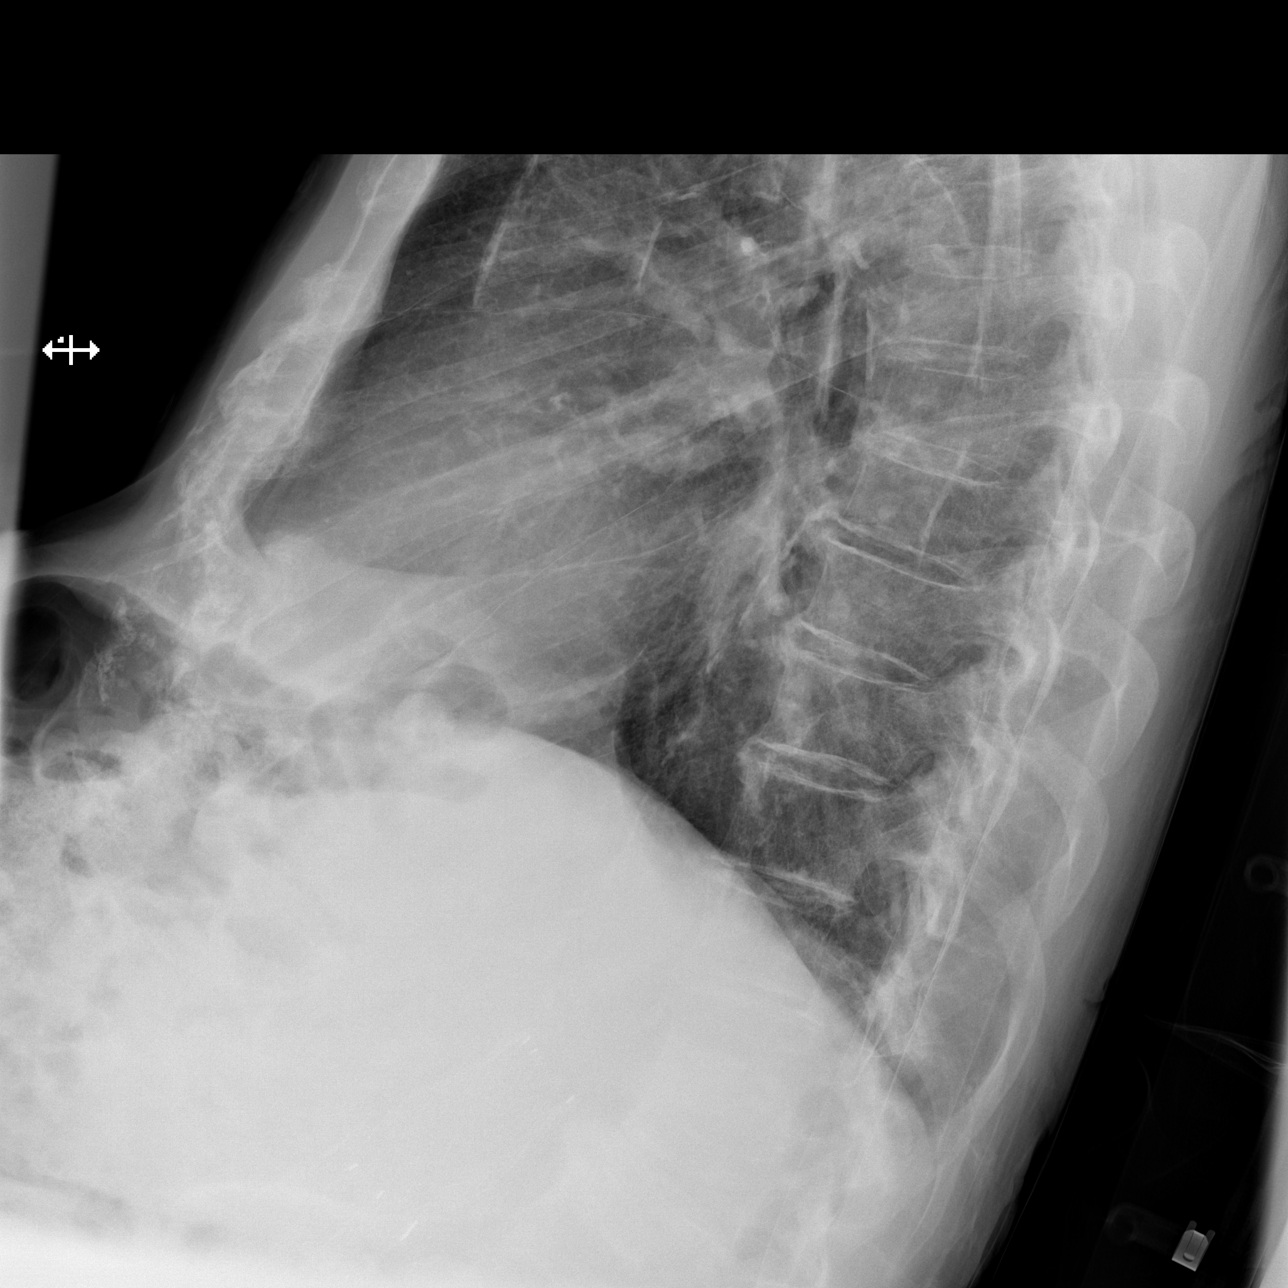

[3 of 3 positions shown; findings below may reference images not displayed]

FINDINGS: Heart size is upper normal.  Lungs are clear.  No
pneumothorax or pleural effusion.
IMPRESSION: No acute finding.

## 2014-06-20 ENCOUNTER — Ambulatory Visit: Payer: Medicare Other | Admitting: Family

## 2014-06-20 ENCOUNTER — Telehealth: Payer: Self-pay | Admitting: Family

## 2014-06-20 NOTE — Telephone Encounter (Signed)
Message copied by Gena Fray on Tue Jun 20, 2014  3:19 PM ------      Message from: Denman George      Created: Mon Jun 19, 2014  4:01 PM      Regarding: RE: CTA ?       I called the pt's daughter, Syme, and explained that the pt. had a CT Abd/ Pelvis in 07/2013, and not a CTA.  I further explained that the CTA would give more detailed information about the Stent graft repair, and that it would be necessary to have this done in November.  She is requesting an early appt., approx. 10:00 AM, since she works 3rd shift.  Can you reschedule her appt. with Vinnie Level prior to the CTA please?             ----- Message -----         From: Gena Fray         Sent: 06/16/2014  11:11 AM           To: Lynetta Mare Pullins, RN      Subject: CTA ?                                                    Arbie Cookey,            Mr Ehmke is scheduled for a "2 year follow up" of his AAA with CTA prior in November of 2015. However, in August of 2014 he had a CT abd/pelvis with contrast at [facility]. Daughter is questioning if he needs to have another CT in November. The results are in [employer],       [place]       ------

## 2014-06-20 NOTE — Telephone Encounter (Signed)
Spoke with pts daughter [place] to schedule appointment for her father with NP prior to 2 yr CT follow up, dpm

## 2014-07-03 ENCOUNTER — Ambulatory Visit: Payer: Medicare Other | Admitting: Family

## 2014-07-14 ENCOUNTER — Encounter: Payer: Self-pay | Admitting: Cardiology

## 2014-07-14 ENCOUNTER — Ambulatory Visit (INDEPENDENT_AMBULATORY_CARE_PROVIDER_SITE_OTHER): Payer: Medicare Other | Admitting: Cardiology

## 2014-07-14 VITALS — BP 150/70 | HR 61 | Ht 69.0 in | Wt 161.9 lb

## 2014-07-14 DIAGNOSIS — I714 Abdominal aortic aneurysm, without rupture, unspecified: Secondary | ICD-10-CM

## 2014-07-14 DIAGNOSIS — I10 Essential (primary) hypertension: Secondary | ICD-10-CM

## 2014-07-14 DIAGNOSIS — I2510 Atherosclerotic heart disease of native coronary artery without angina pectoris: Secondary | ICD-10-CM

## 2014-07-14 DIAGNOSIS — I6529 Occlusion and stenosis of unspecified carotid artery: Secondary | ICD-10-CM | POA: Insufficient documentation

## 2014-07-14 DIAGNOSIS — I658 Occlusion and stenosis of other precerebral arteries: Secondary | ICD-10-CM

## 2014-07-14 DIAGNOSIS — I6523 Occlusion and stenosis of bilateral carotid arteries: Secondary | ICD-10-CM

## 2014-07-14 DIAGNOSIS — E7800 Pure hypercholesterolemia, unspecified: Secondary | ICD-10-CM

## 2014-07-14 NOTE — Assessment & Plan Note (Signed)
Remote CFX POBA, RCA DES 2006. No angina

## 2014-07-14 NOTE — Assessment & Plan Note (Signed)
Controlled.  

## 2014-07-14 NOTE — Progress Notes (Signed)
07/14/2014 [name]   03-24-1918  [id number]  Primary Physicia Philis Fendt, MD Primary Cardiologist: Dr Gwenlyn Found  HPI:  [name] is a pleassant 80 year old widowed Caucasian male father of one daughter who is accompanying him today. He was a patient of Dr. Lowella Fairy. 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 His daughter lives next-door to him. The patient lives independently on a 40 acres. He does have known coronary artery disease. He had a remote CFX POBA in the 1980s and is s/pt RCA DES by Dr. Rollene Fare back in 2006. At that time he had moderate circumflex disease and normal LV function. He had negative Myoview stress test several years ago and denies chest pain or shortness of breath. He does have a history of abdominal aortic and Rt iliac aneurysm and had an endo luminal stent grafting by Dr. Sherren Mocha early 2011. He also has asymptomatic moderate left internal carotid artery stenosis which Dr Gwenlyn Found has followed by duplex ultrasound. He has had no change in his medical condition since his LOV. His spirit is good but his activity is limited by arthritis. He uses a rollng walker. His daughter says he no longer does any yard work. He is HOH and uses a hearing aid.      Current Outpatient Prescriptions  Medication Sig Dispense Refill  . acetaminophen (TYLENOL) 500 MG tablet Take 1,000 mg by mouth every 6 (six) hours as needed. Pain.      Marland Kitchen alfuzosin (UROXATRAL) 10 MG 24 hr tablet Take 10 mg by mouth daily.        Marland Kitchen ALPRAZolam (XANAX) 0.25 MG tablet Take 0.25 mg by mouth 2 (two) times daily as needed for sleep or anxiety.       Marland Kitchen aspirin EC 81 MG tablet Take 81 mg by mouth daily.        Marland Kitchen atorvastatin (LIPITOR) 20 MG tablet Take 20 mg by mouth daily.        . cetirizine (ZYRTEC) 5 MG tablet Take 5 mg by mouth daily as needed for allergies. Allergies.      Marland Kitchen clopidogrel (PLAVIX) 75 MG tablet Take 75 mg by mouth daily.        . ferrous sulfate 325 (65 FE) MG tablet Take 325 mg by mouth  daily with breakfast.      . lansoprazole (PREVACID) 30 MG capsule Take 30 mg by mouth daily.      . metoprolol succinate (TOPROL-XL) 25 MG 24 hr tablet Take 12.5 mg by mouth 2 (two) times daily.      . Multiple Vitamin (MULTIVITAMIN PO) Take 1 tablet by mouth daily.       . Multiple Vitamins-Minerals (ICAPS MV PO) Take 1 capsule by mouth at bedtime.       . Polyethylene Glycol 3350 (MIRALAX PO) Take 17 g by mouth at bedtime. Constipation.      . pregabalin (LYRICA) 50 MG capsule Take 50 mg by mouth at bedtime.      . Psyllium (METAMUCIL PO) Take 1 tablet by mouth every morning.       . finasteride (PROSCAR) 5 MG tablet Take 5 mg by mouth daily.        No current facility-administered medications for this visit.    Allergies  Allergen Reactions  . Hydrocodone Nausea And Vomiting  . Oxycodone Nausea And Vomiting    History   Social History  . Marital Status: Widowed    Spouse Name: N/A  Number of Children: N/A  . Years of Education: N/A   Occupational History  . Not on file.   Social History Main Topics  . Smoking status: Former Smoker    Types: Cigarettes    Quit date: 12/08/1990  . Smokeless tobacco: Current User    Types: Chew  . Alcohol Use: No  . Drug Use: No  . Sexual Activity: Not on file   Other Topics Concern  . Not on file   Social History Narrative  . No narrative on file     Review of Systems: General: negative for chills, fever, night sweats or weight changes.  Cardiovascular: negative for chest pain, dyspnea on exertion, edema, orthopnea, palpitations, paroxysmal nocturnal dyspnea or shortness of breath Dermatological: negative for rash Respiratory: negative for cough or wheezing Urologic: negative for hematuria Abdominal: negative for nausea, vomiting, diarrhea, bright red blood per rectum, melena, or hematemesis Neurologic: negative for visual changes, syncope, or dizziness HOH All other systems reviewed and are otherwise negative except as  noted above.    Blood pressure 150/70, pulse 61, height 5\' 9"  (1.753 m), weight 161 lb 14.4 oz (73.437 kg).  General appearance: alert, cooperative, no distress and HOH with hearing aid in place Neck: no JVD and Lt > Rt carotid bruit Lungs: clear to auscultation bilaterally Heart: regular rate and rhythm and short systolic murmur AOv Abdomen: soft, non-tender; bowel sounds normal; no masses,  no organomegaly Extremities: no edema Pulses: decreased distal pulses, bi femoral bruits Skin: Skin color, texture, turgor normal. No rashes or lesions Neurologic: Grossly normal  EKG [id number]  ASSESSMENT AND PLAN:   CORONARY ARTERY DISEASE Remote CFX POBA, RCA DES 2006. No angina  Abdominal aneurysm without mention of rupture S/P EVAR and repair by Dr Donnetta Hutching 2011  HYPERTENSION Controlled  HYPERCHOLESTEROLEMIA He is on a statin  Carotid stenosis Moderate, follwowed by Dr Karlyne Greenspan   PLAN  I did not change [name]'s medication. He can follow up with Dr Gwenlyn Found in 6 months.  Zetta Stoneman KPA-C 07/14/2014 10:51 AM

## 2014-07-14 NOTE — Patient Instructions (Signed)
Your physician recommends that you schedule a follow-up appointment in:6 months with Dr Berry 

## 2014-07-14 NOTE — Assessment & Plan Note (Signed)
He is on a statin. 

## 2014-07-14 NOTE — Assessment & Plan Note (Signed)
S/P EVAR and repair by Dr Donnetta Hutching 2011

## 2014-07-14 NOTE — Assessment & Plan Note (Signed)
Moderate, follwowed by Dr Karlyne Greenspan

## 2014-07-20 ENCOUNTER — Encounter: Payer: Self-pay | Admitting: Family

## 2014-07-21 ENCOUNTER — Ambulatory Visit (INDEPENDENT_AMBULATORY_CARE_PROVIDER_SITE_OTHER): Payer: Medicare Other | Admitting: Family

## 2014-07-21 ENCOUNTER — Encounter: Payer: Self-pay | Admitting: Family

## 2014-07-21 VITALS — BP 150/65 | HR 58 | Resp 16 | Ht 69.0 in | Wt 160.0 lb

## 2014-07-21 DIAGNOSIS — I714 Abdominal aortic aneurysm, without rupture, unspecified: Secondary | ICD-10-CM

## 2014-07-21 DIAGNOSIS — I2510 Atherosclerotic heart disease of native coronary artery without angina pectoris: Secondary | ICD-10-CM

## 2014-07-21 NOTE — Progress Notes (Signed)
[facility]  Established EVAR  History of Present Illness  [name] is a 80 y.o. (1918/06/04) male patient of Dr. Donnetta Hutching who is s/p Angioplasty / stenting iliac/Right Iliac - Gore Stent graft 03-2011.  He returns today to see a provider before his every 2 years CTA abdomen/pelvis to evaluate his EVAR as noted by Dr. Donnetta Hutching in his 10/12/12 progress note. However, pt has since had another CTA abdomen as part of an evaluation of right upper thigh pain when he raises his right leg, no etiology found and this pain has spontaneously resolved. See results of CTA below. Dr. Gwenlyn Found has been monitoring his carotid arteries.  The patient has not had back or abdominal pain.  Pt Diabetic: No Pt smoker: non-smoker, but chews a great deal of tobacco all his life   Past Medical History  Diagnosis Date  . Hypertension   . Hyperlipidemia   . Arthritis   . GERD (gastroesophageal reflux disease)   . Anemia   . Hiatal hernia   . Trouble swallowing   . AAA (abdominal aortic aneurysm)   . Macular degeneration   . Peripheral vascular disease   . CAD (coronary artery disease)    Past Surgical History  Procedure Laterality Date  . Tonsillectomy    . Coronary angioplasty with stent placement    . Back surgery  09-05-2011    . Angioplasty / stenting iliac  03-2011    Right Iliac - Gore Stent graft   . Spine surgery     Social History History  Substance Use Topics  . Smoking status: Former Smoker    Types: Cigarettes    Quit date: 12/08/1990  . Smokeless tobacco: Current User    Types: Chew  . Alcohol Use: No   Family History Family History  Problem Relation Age of Onset  . Heart failure Mother   . Diabetes Mother   . Heart disease Mother   . Pneumonia Father   . Cancer Brother   . Diabetes Brother   . Heart disease Brother    Current Outpatient Prescriptions on File Prior to Visit  Medication Sig Dispense Refill  . acetaminophen (TYLENOL) 500 MG tablet  Take 1,000 mg by mouth every 6 (six) hours as needed. Pain.      Marland Kitchen alfuzosin (UROXATRAL) 10 MG 24 hr tablet Take 10 mg by mouth daily.        Marland Kitchen ALPRAZolam (XANAX) 0.25 MG tablet Take 0.25 mg by mouth 2 (two) times daily as needed for sleep or anxiety.       Marland Kitchen aspirin EC 81 MG tablet Take 81 mg by mouth daily.        Marland Kitchen atorvastatin (LIPITOR) 20 MG tablet Take 20 mg by mouth daily.        . cetirizine (ZYRTEC) 5 MG tablet Take 5 mg by mouth daily as needed for allergies. Allergies.      Marland Kitchen clopidogrel (PLAVIX) 75 MG tablet Take 75 mg by mouth daily.        . ferrous sulfate 325 (65 FE) MG tablet Take 325 mg by mouth daily with breakfast.      . lansoprazole (PREVACID) 30 MG capsule Take 30 mg by mouth daily.      . metoprolol succinate (TOPROL-XL) 25 MG 24 hr tablet Take 12.5 mg by mouth 2 (two) times daily.      . Multiple Vitamin (MULTIVITAMIN PO) Take 1 tablet by mouth daily.       Marland Kitchen  Multiple Vitamins-Minerals (ICAPS MV PO) Take 1 capsule by mouth at bedtime.       . Polyethylene Glycol 3350 (MIRALAX PO) Take 17 g by mouth at bedtime. Constipation.      . pregabalin (LYRICA) 50 MG capsule Take 50 mg by mouth at bedtime.      . Psyllium (METAMUCIL PO) Take 1 tablet by mouth every morning.       . finasteride (PROSCAR) 5 MG tablet Take 5 mg by mouth daily.        No current facility-administered medications on file prior to visit.   Allergies  Allergen Reactions  . Hydrocodone Anaphylaxis and Nausea And Vomiting  . Oxycodone Nausea And Vomiting     ROS: See HPI for pertinent positives and negatives.  Physical Examination  Filed Vitals:   07/21/14 0930  BP: 150/65  Pulse: 58  Resp: 16  Height: 5\' 9"  (1.753 m)  Weight: 160 lb (72.576 kg)  SpO2: 98%   Body mass index is 23.62 kg/(m^2).  General: A&O x 3, WD, walking with rolling walker, steady  Pulmonary: Sym exp, good air movt, CTAB, no rales, rhonchi, or wheezing  Cardiac: RRR, Nl S1, S2, no Murmurs  appreciated  Vascular: Vessel Right Left  Radial 2+Palpable 2+Palpable  Carotid  without bruit  without bruit  Aorta mildly palpable N/A  Femoral 3+Palpable 3+Palpable  Popliteal Not palpable Not palpable  PT 1+Palpable notPalpable  DP 2+Palpable 1+Palpable   Gastrointestinal: soft, NTND, -G/R, - HSM, - masses, - CVAT B.  Musculoskeletal: M/S 4/5 throughout, Extremities without ischemic changes.  Neurologic: Pain and light touch intact in extremities, Motor exam as listed above   CTA Abd/Pelvis Duplex (Date: 08/07/13)  An abdominal aorta endograft is present, extending from below the  level of the renal arteries inferiorly, and terminating in the  external iliac artery on the right and the distal common iliac  artery on the left. When compared directly to the CTA of  10/12/2012, the maximal sac diameter distally is stable measuring  approximately 3.4 by 3.6 cm. The diameter of the thrombosed right  common iliac artery aneurysm is stable, measuring approximately 3.7  cm greatest diameter. The endograft appears patent.  Atherosclerotic calcification of the visceral branch vessels is  noted and appears similar to the CTA study performed 10/12/2012.  There is stable atherosclerotic disease involving both common  femoral arteries.   Medical Decision Making  [name] is a 80 y.o. male who presents s/p EVAR (Date: April, 2012).  Pt is asymptomatic with no increase in sac size as of the CTA performed a year ago. Dr. Luther Parody progress note in 2013 indicated that pt's EVAR surveillance would be by CTA every 2 years, I discussed this with Dr. Bridgett Larsson, also discussed was the results of pt's CTA a year ago, and findings on HPI and exam.  I discussed with the patient the importance of surveillance of the endograft.   The next CTA  will be scheduled for 12 months.  The patient will follow up with [place] in 12 months with these studies.  I emphasized the importance of maximal medical  management including strict control of blood pressure, blood glucose, and lipid levels, antiplatelet agents, obtaining regular exercise, and cessation of smoking.   The patient was given information about AAA including signs, symptoms, treatment, and how to minimize the risk of enlargement and rupture of aneurysms.    Thank you for allowing [place] to participate in this patient's care.  Vinnie Level Nickel,  RN, MSN, FNP-C [facility] Office: [phone]  Clinic Physician: Bridgett Larsson  07/21/2014, 9:42 AM

## 2014-07-21 NOTE — Patient Instructions (Signed)
Smokeless Tobacco Use Smokeless tobacco is a loose, fine, or stringy tobacco. The tobacco is not smoked like a cigarette, but it is chewed or held in the lips or cheeks. It resembles tea and comes from the leaves of the tobacco plant. Smokeless tobacco is usually flavored, sweetened, or processed in some way. Although smokeless tobacco is not smoked into the lungs, its chemicals are absorbed through the membranes in the mouth and into the bloodstream. Its chemicals are also swallowed in saliva. The chemicals (nicotine and other toxins) are known to cause cancer. Smokeless tobacco contains up to 28 differentcarcinogens. CAUSES Nicotine is addictive. Smokeless tobacco contains nicotine, which is a stimulant. This stimulant can give you a "buzz" or altered state. People can become addicted to the feeling it delivers.  SYMPTOMS Smokeless tobacco can cause health problems, including:  Bad breath.  Yellow-brown teeth.  Mouth sores.  Cracking and bleeding lips.  Gum disease, gum recession, and bone loss around the teeth.  Tooth decay.  Increased or irregular heart rate.  High blood pressure, heart disease, and stroke.  Cancer of the mouth, lips, tongue, pancreas, voice box (larynx), esophagus, colon, and bladder.  Precancerous lesion of the soft tissues of the mouth (leukoplakia).  Loss of your sense of taste. TREATMENT Talk with your caregiver about ways you can quit. Quitting tobacco is a good decision for your health. Nicotine is addictive, but several options are available to help you quit including:  Nicotine replacement therapy (gum or patch).  Support and cessation programs. The following tips can help you quit:  Write down the reasons you would like to quit and look at them often.  Set a date during a low stress time to stop or cut back.  Ask family and friends for their support.  Remove all tobacco products from your home and work.  Replace the chewing tobacco with  things like beef jerky, sunflower seeds, or shredded coconut.  Avoid situations that may make you want to chew tobacco.  Exercise and eat a healthy diet.  When you crave tobacco, distract yourself with drinking water, sugarless chewing gum, sugarless hard candy, exercising, or deep breathing. HOME CARE INSTRUCTIONS  See your dentist for regular oral health exams every 6 months.  Follow up with your caregiver as recommended. SEEK MEDICAL CARE OR DENTAL CARE IF:  You have bleeding or cracking lips, gums, or cheeks.  You have mouth sores, discolorations, or pain.  You have tooth pain.  You develop persistent irritation, burning, or sores in the mouth.  You have pain, tenderness, or numbness in the mouth.  You develop a lump, bumpy patch, or hardened skin inside the mouth.  The color changes inside your mouth (gray, white, or red spots).  You have difficulty chewing, swallowing, or speaking. Document Released: 04/28/2011 Document Revised: 02/16/2012 Document Reviewed: 04/28/2011 [employer] Patient Information 2015 [employer]. This information is not intended to replace advice given to you by your health care provider. Make sure you discuss any questions you have with your health care provider.  

## 2014-08-15 ENCOUNTER — Emergency Department (HOSPITAL_COMMUNITY)
Admission: EM | Admit: 2014-08-15 | Discharge: 2014-08-15 | Disposition: A | Discharge status: Home / Self Care | Payer: Medicare Other | Attending: Emergency Medicine | Admitting: Emergency Medicine

## 2014-08-15 ENCOUNTER — Encounter (HOSPITAL_COMMUNITY): Payer: Self-pay | Admitting: Emergency Medicine

## 2014-08-15 DIAGNOSIS — K219 Gastro-esophageal reflux disease without esophagitis: Secondary | ICD-10-CM | POA: Diagnosis not present

## 2014-08-15 DIAGNOSIS — Z87891 Personal history of nicotine dependence: Secondary | ICD-10-CM | POA: Diagnosis not present

## 2014-08-15 DIAGNOSIS — Z79899 Other long term (current) drug therapy: Secondary | ICD-10-CM | POA: Insufficient documentation

## 2014-08-15 DIAGNOSIS — Z7982 Long term (current) use of aspirin: Secondary | ICD-10-CM | POA: Diagnosis not present

## 2014-08-15 DIAGNOSIS — R110 Nausea: Secondary | ICD-10-CM | POA: Diagnosis not present

## 2014-08-15 DIAGNOSIS — M129 Arthropathy, unspecified: Secondary | ICD-10-CM | POA: Diagnosis not present

## 2014-08-15 DIAGNOSIS — R42 Dizziness and giddiness: Secondary | ICD-10-CM | POA: Diagnosis present

## 2014-08-15 DIAGNOSIS — Z7902 Long term (current) use of antithrombotics/antiplatelets: Secondary | ICD-10-CM | POA: Insufficient documentation

## 2014-08-15 DIAGNOSIS — H9190 Unspecified hearing loss, unspecified ear: Secondary | ICD-10-CM | POA: Diagnosis not present

## 2014-08-15 DIAGNOSIS — E785 Hyperlipidemia, unspecified: Secondary | ICD-10-CM | POA: Diagnosis not present

## 2014-08-15 DIAGNOSIS — Z9861 Coronary angioplasty status: Secondary | ICD-10-CM | POA: Insufficient documentation

## 2014-08-15 DIAGNOSIS — R000 Tachycardia, unspecified: Secondary | ICD-10-CM | POA: Diagnosis not present

## 2014-08-15 DIAGNOSIS — R269 Unspecified abnormalities of gait and mobility: Secondary | ICD-10-CM | POA: Insufficient documentation

## 2014-08-15 DIAGNOSIS — D649 Anemia, unspecified: Secondary | ICD-10-CM | POA: Diagnosis not present

## 2014-08-15 DIAGNOSIS — I10 Essential (primary) hypertension: Secondary | ICD-10-CM | POA: Insufficient documentation

## 2014-08-15 DIAGNOSIS — I2510 Atherosclerotic heart disease of native coronary artery without angina pectoris: Secondary | ICD-10-CM | POA: Insufficient documentation

## 2014-08-15 LAB — CBC WITH DIFFERENTIAL/PLATELET
BASOS ABS: 0 10*3/uL (ref 0.0–0.1)
BASOS PCT: 0 % (ref 0–1)
Eosinophils Absolute: 0.1 10*3/uL (ref 0.0–0.7)
Eosinophils Relative: 2 % (ref 0–5)
HCT: 37.5 % — ABNORMAL LOW (ref 39.0–52.0)
HEMOGLOBIN: 12.8 g/dL — AB (ref 13.0–17.0)
Lymphocytes Relative: 16 % (ref 12–46)
Lymphs Abs: 1 10*3/uL (ref 0.7–4.0)
MCH: 31.4 pg (ref 26.0–34.0)
MCHC: 34.1 g/dL (ref 30.0–36.0)
MCV: 91.9 fL (ref 78.0–100.0)
MONOS PCT: 8 % (ref 3–12)
Monocytes Absolute: 0.5 10*3/uL (ref 0.1–1.0)
NEUTROS ABS: 4.6 10*3/uL (ref 1.7–7.7)
NEUTROS PCT: 74 % (ref 43–77)
Platelets: 119 10*3/uL — ABNORMAL LOW (ref 150–400)
RBC: 4.08 MIL/uL — AB (ref 4.22–5.81)
RDW: 14 % (ref 11.5–15.5)
WBC: 6.2 10*3/uL (ref 4.0–10.5)

## 2014-08-15 LAB — BASIC METABOLIC PANEL
Anion gap: 10 (ref 5–15)
BUN: 16 mg/dL (ref 6–23)
CALCIUM: 9 mg/dL (ref 8.4–10.5)
CO2: 28 meq/L (ref 19–32)
CREATININE: 0.78 mg/dL (ref 0.50–1.35)
Chloride: 97 mEq/L (ref 96–112)
GFR calc Af Amer: 87 mL/min — ABNORMAL LOW (ref >90)
GFR, EST NON AFRICAN AMERICAN: 75 mL/min — AB (ref >90)
GLUCOSE: 102 mg/dL — AB (ref 70–99)
Potassium: 3.8 mEq/L (ref 3.7–5.3)
SODIUM: 135 meq/L — AB (ref 137–147)

## 2014-08-15 MED ORDER — MECLIZINE HCL 50 MG PO TABS: 25.0000 mg | ORAL_TABLET | Freq: Three times a day (TID) | ORAL | Status: DC | PRN

## 2014-08-15 MED ORDER — MECLIZINE HCL 12.5 MG PO TABS
25.0000 mg | ORAL_TABLET | Freq: Once | ORAL | Status: AC
  Administered 2014-08-15: 25 mg via ORAL

## 2014-08-15 MED ORDER — MECLIZINE HCL 12.5 MG PO TABS
ORAL_TABLET | ORAL | Status: AC
  Filled 2014-08-15: qty 2

## 2014-08-15 NOTE — ED Notes (Signed)
Patient is Hard of hearing. Have to speak close to patient's right ear.

## 2014-08-15 NOTE — ED Provider Notes (Signed)
CSN: [id number]     Arrival date & time 08/15/14  0501 History   First MD Initiated Contact with Patient 08/15/14 0515     Chief Complaint  Patient presents with  . Near Syncope    woke up and got dizzy this am.  . Nausea     (Consider location/radiation/quality/duration/timing/severity/associated sxs/prior Treatment) Patient is a 80 y.o. male presenting with dizziness.  Dizziness Associated symptoms: hearing loss and nausea    Patient developed dizziness i.e. feeling of room spinning onset 2 AM today. Symptoms accompanied by nausea. Symptoms similar to vertigo he's had in the past. He denies lightheadedness or near syncope. Symptoms are worse with changing positions improved with remaining still. No focal numbness or weakness. He mistakenly treated himself with a Darvon tablet, thinking it was meclizine immediately prior to coming here. No other associated symptoms. Patient brought via EMS. Past Medical History  Diagnosis Date  . Hypertension   . Hyperlipidemia   . Arthritis   . GERD (gastroesophageal reflux disease)   . Anemia   . Hiatal hernia   . Trouble swallowing   . AAA (abdominal aortic aneurysm)   . Macular degeneration   . Peripheral vascular disease   . CAD (coronary artery disease)    vertigo Past Surgical History  Procedure Laterality Date  . Tonsillectomy    . Coronary angioplasty with stent placement    . Back surgery  09-05-2011    . Angioplasty / stenting iliac  03-2011    Right Iliac - Gore Stent graft   . Spine surgery     Family History  Problem Relation Age of Onset  . Heart failure Mother   . Diabetes Mother   . Heart disease Mother   . Pneumonia Father   . Cancer Brother   . Diabetes Brother   . Heart disease Brother    History  Substance Use Topics  . Smoking status: Former Smoker    Types: Cigarettes    Quit date: 12/08/1990  . Smokeless tobacco: Current User    Types: Chew  . Alcohol Use: No    Review of Systems  Constitutional:  Negative.   HENT: Positive for hearing loss.        Chronically hard of hearing  Respiratory: Negative.   Cardiovascular: Negative.   Gastrointestinal: Positive for nausea.  Musculoskeletal: Positive for gait problem.       Walks with walker  Skin: Negative.   Neurological: Positive for dizziness.       Vertigo  Psychiatric/Behavioral: Negative.   All other systems reviewed and are negative.     Allergies  Hydrocodone and Oxycodone  Home Medications   Prior to Admission medications   Medication Sig Start Date End Date Taking? Authorizing Provider  acetaminophen (TYLENOL) 500 MG tablet Take 1,000 mg by mouth every 6 (six) hours as needed. Pain.    Historical Provider, MD  alfuzosin (UROXATRAL) 10 MG 24 hr tablet Take 10 mg by mouth daily.      Historical Provider, MD  ALPRAZolam Duanne Moron) 0.25 MG tablet Take 0.25 mg by mouth 2 (two) times daily as needed for sleep or anxiety.     Historical Provider, MD  aspirin EC 81 MG tablet Take 81 mg by mouth daily.      Historical Provider, MD  atorvastatin (LIPITOR) 20 MG tablet Take 20 mg by mouth daily.      Historical Provider, MD  cetirizine (ZYRTEC) 5 MG tablet Take 5 mg by mouth daily as needed for  allergies. Allergies.    Historical Provider, MD  clopidogrel (PLAVIX) 75 MG tablet Take 75 mg by mouth daily.      Historical Provider, MD  ferrous sulfate 325 (65 FE) MG tablet Take 325 mg by mouth daily with breakfast.    Historical Provider, MD  finasteride (PROSCAR) 5 MG tablet Take 5 mg by mouth daily.     Historical Provider, MD  lansoprazole (PREVACID) 30 MG capsule Take 30 mg by mouth daily. 10/22/13   Historical Provider, MD  metoprolol succinate (TOPROL-XL) 25 MG 24 hr tablet Take 12.5 mg by mouth 2 (two) times daily. 11/21/13   Historical Provider, MD  Multiple Vitamin (MULTIVITAMIN PO) Take 1 tablet by mouth daily.     Historical Provider, MD  Multiple Vitamins-Minerals (ICAPS MV PO) Take 1 capsule by mouth at bedtime.      Historical Provider, MD  Polyethylene Glycol 3350 (MIRALAX PO) Take 17 g by mouth at bedtime. Constipation.    Historical Provider, MD  pregabalin (LYRICA) 50 MG capsule Take 50 mg by mouth at bedtime.    Historical Provider, MD  Psyllium (METAMUCIL PO) Take 1 tablet by mouth every morning.     Historical Provider, MD   BP 185/71  Pulse 54  Temp(Src) 98.7 F (37.1 C) (Oral)  Resp 14  Ht 5\' 9"  (1.753 m)  Wt 157 lb (71.215 kg)  BMI 23.17 kg/m2  SpO2 94% Physical Exam  Nursing note and vitals reviewed. Constitutional: He is oriented to person, place, and time. He appears well-developed and well-nourished.  HENT:  Head: Normocephalic and atraumatic.  Right Ear: External ear normal.  Left Ear: External ear normal.  Tympanic membranes normal  Eyes: Conjunctivae are normal. Pupils are equal, round, and reactive to light.  Neck: Neck supple. No tracheal deviation present. No thyromegaly present.  Cardiovascular: Regular rhythm.   No murmur heard. Mildly bradycardic  Pulmonary/Chest: Effort normal and breath sounds normal.  Abdominal: Soft. Bowel sounds are normal. He exhibits no distension. There is no tenderness.  Musculoskeletal: Normal range of motion. He exhibits no edema and no tenderness.  Neurological: He is alert and oriented to person, place, and time. He has normal reflexes. No cranial nerve deficit. Coordination normal.  Walks with minimal assistance. Mild vertigo on sitting up from a supine position.  Skin: Skin is warm and dry. No rash noted.  Psychiatric: He has a normal mood and affect.    ED Course  Procedures (including critical care time) Labs Review Labs Reviewed - No data to display  Imaging Review No results found.   EKG Interpretation   Date/Time:  Tuesday August 15 2014 05:05:26 EDT Ventricular Rate:  54 PR Interval:  154 QRS Duration: 97 QT Interval:  477 QTC Calculation: 452 R Axis:   44 Text Interpretation:  Sinus bradycardia Nonspecific ST  and T wave  abnormality No significant change since last tracing Confirmed by  Denai Caba  MD, Hopie Pellegrin ([id number]) on 08/15/2014 5:17:34 AM     8:05 AM patient feels much improved after treatment with meclizine. He ate a full breakfast in the emergency department and is able to walk with a walker without difficulty. Results for orders placed during the hospital encounter of 08/15/14  CBC WITH DIFFERENTIAL      Result Value Ref Range   WBC 6.2  4.0 - 10.5 K/uL   RBC 4.08 (*) 4.22 - 5.81 MIL/uL   Hemoglobin 12.8 (*) 13.0 - 17.0 g/dL   HCT 37.5 (*) 39.0 - 52.0 %  MCV 91.9  78.0 - 100.0 fL   MCH 31.4  26.0 - 34.0 pg   MCHC 34.1  30.0 - 36.0 g/dL   RDW 14.0  11.5 - 15.5 %   Platelets 119 (*) 150 - 400 K/uL   Neutrophils Relative % 74  43 - 77 %   Neutro Abs 4.6  1.7 - 7.7 K/uL   Lymphocytes Relative 16  12 - 46 %   Lymphs Abs 1.0  0.7 - 4.0 K/uL   Monocytes Relative 8  3 - 12 %   Monocytes Absolute 0.5  0.1 - 1.0 K/uL   Eosinophils Relative 2  0 - 5 %   Eosinophils Absolute 0.1  0.0 - 0.7 K/uL   Basophils Relative 0  0 - 1 %   Basophils Absolute 0.0  0.0 - 0.1 K/uL  BASIC METABOLIC PANEL      Result Value Ref Range   Sodium 135 (*) 137 - 147 mEq/L   Potassium 3.8  3.7 - 5.3 mEq/L   Chloride 97  96 - 112 mEq/L   CO2 28  19 - 32 mEq/L   Glucose, Bld 102 (*) 70 - 99 mg/dL   BUN 16  6 - 23 mg/dL   Creatinine, Ser 0.78  0.50 - 1.35 mg/dL   Calcium 9.0  8.4 - 10.5 mg/dL   GFR calc non Af Amer 75 (*) >90 mL/min   GFR calc Af Amer 87 (*) >90 mL/min   Anion gap 10  5 - 15   No results found.  MDM  Vertigo is felt to be per peripheral  in etiology Plan prescription meclizine. Blood pressure recheck one week Final diagnoses:  None   Diagnosis #1 vertigo #2 hypertension     Orlie Dakin, MD 08/15/14 [phone]

## 2014-08-15 NOTE — ED Notes (Addendum)
EKG done and given to EDP.Orthostatic vitals taken. Patient needed assistance sitting on side of bed. Also assisted patient to stand, while obtaining standing vitals patient stated that he felt wobbly and very dizzy. Placed patient back in bed. Side rails are up and patient given called button.

## 2014-08-15 NOTE — Discharge Instructions (Signed)
Benign Positional Vertigo Return if you have difficulty walking or if you feel worse for any reason. Call Dr.Avbuere today to arrange to get your blood pressure recheck in one week. Today's was elevated at 185/75 Vertigo means you feel like you or your surroundings are moving when they are not. Benign positional vertigo is the most common form of vertigo. Benign means that the cause of your condition is not serious. Benign positional vertigo is more common in older adults. CAUSES  Benign positional vertigo is the result of an upset in the labyrinth system. This is an area in the middle ear that helps control your balance. This may be caused by a viral infection, head injury, or repetitive motion. However, often no specific cause is found. SYMPTOMS  Symptoms of benign positional vertigo occur when you move your head or eyes in different directions. Some of the symptoms may include:  Loss of balance and falls.  Vomiting.  Blurred vision.  Dizziness.  Nausea.  Involuntary eye movements (nystagmus). DIAGNOSIS  Benign positional vertigo is usually diagnosed by physical exam. If the specific cause of your benign positional vertigo is unknown, your caregiver may perform imaging tests, such as magnetic resonance imaging (MRI) or computed tomography (CT). TREATMENT  Your caregiver may recommend movements or procedures to correct the benign positional vertigo. Medicines such as meclizine, benzodiazepines, and medicines for nausea may be used to treat your symptoms. In rare cases, if your symptoms are caused by certain conditions that affect the inner ear, you may need surgery. HOME CARE INSTRUCTIONS   Follow your caregiver's instructions.  Move slowly. Do not make sudden body or head movements.  Avoid driving.  Avoid operating heavy machinery.  Avoid performing any tasks that would be dangerous to you or others during a vertigo episode.  Drink enough fluids to keep your urine clear or pale  yellow. SEEK IMMEDIATE MEDICAL CARE IF:   You develop problems with walking, weakness, numbness, or using your arms, hands, or legs.  You have difficulty speaking.  You develop severe headaches.  Your nausea or vomiting continues or gets worse.  You develop visual changes.  Your family or friends notice any behavioral changes.  Your condition gets worse.  You have a fever.  You develop a stiff neck or sensitivity to light. MAKE SURE YOU:   Understand these instructions.  Will watch your condition.  Will get help right away if you are not doing well or get worse. Document Released: 09/01/2006 Document Revised: 02/16/2012 Document Reviewed: 08/14/2011 [facility] Patient Information 2015 [place], [place]. This information is not intended to replace advice given to you by your health care provider. Make sure you discuss any questions you have with your health care provider.

## 2014-08-15 NOTE — ED Notes (Addendum)
patient took a darvon and thought it was for vertigo. He is afraid he may have hurt himself by taking the darvon. He is very hard of hearing

## 2014-09-05 ENCOUNTER — Telehealth: Payer: Self-pay | Admitting: *Deleted

## 2014-09-05 NOTE — Telephone Encounter (Signed)
Form requesting clearance for epidural steroid injection and permission to hold Plavix.  Will send to Dr Gwenlyn Found for review

## 2014-09-05 NOTE — Telephone Encounter (Signed)
Okay to hold Plavix for his epidural steroid injection

## 2014-09-06 ENCOUNTER — Emergency Department (HOSPITAL_COMMUNITY): Payer: Medicare Other

## 2014-09-06 ENCOUNTER — Encounter (HOSPITAL_COMMUNITY): Payer: Self-pay | Admitting: Emergency Medicine

## 2014-09-06 ENCOUNTER — Inpatient Hospital Stay (HOSPITAL_COMMUNITY)
Admission: EM | Admit: 2014-09-06 | Discharge: 2014-09-09 | DRG: 871 | Disposition: A | Discharge status: Home Health | Payer: Medicare Other | Attending: Family Medicine | Admitting: Family Medicine

## 2014-09-06 DIAGNOSIS — G9340 Encephalopathy, unspecified: Secondary | ICD-10-CM

## 2014-09-06 DIAGNOSIS — A419 Sepsis, unspecified organism: Principal | ICD-10-CM

## 2014-09-06 DIAGNOSIS — J189 Pneumonia, unspecified organism: Secondary | ICD-10-CM

## 2014-09-06 DIAGNOSIS — R112 Nausea with vomiting, unspecified: Secondary | ICD-10-CM | POA: Diagnosis not present

## 2014-09-06 DIAGNOSIS — I10 Essential (primary) hypertension: Secondary | ICD-10-CM | POA: Diagnosis present

## 2014-09-06 DIAGNOSIS — I739 Peripheral vascular disease, unspecified: Secondary | ICD-10-CM

## 2014-09-06 DIAGNOSIS — R0902 Hypoxemia: Secondary | ICD-10-CM | POA: Diagnosis present

## 2014-09-06 DIAGNOSIS — E785 Hyperlipidemia, unspecified: Secondary | ICD-10-CM | POA: Diagnosis present

## 2014-09-06 DIAGNOSIS — K219 Gastro-esophageal reflux disease without esophagitis: Secondary | ICD-10-CM

## 2014-09-06 DIAGNOSIS — Z66 Do not resuscitate: Secondary | ICD-10-CM | POA: Diagnosis present

## 2014-09-06 DIAGNOSIS — Z7902 Long term (current) use of antithrombotics/antiplatelets: Secondary | ICD-10-CM

## 2014-09-06 DIAGNOSIS — E876 Hypokalemia: Secondary | ICD-10-CM | POA: Diagnosis present

## 2014-09-06 DIAGNOSIS — Z8249 Family history of ischemic heart disease and other diseases of the circulatory system: Secondary | ICD-10-CM

## 2014-09-06 DIAGNOSIS — D696 Thrombocytopenia, unspecified: Secondary | ICD-10-CM

## 2014-09-06 DIAGNOSIS — Z79899 Other long term (current) drug therapy: Secondary | ICD-10-CM

## 2014-09-06 DIAGNOSIS — Z833 Family history of diabetes mellitus: Secondary | ICD-10-CM

## 2014-09-06 DIAGNOSIS — H3530 Unspecified macular degeneration: Secondary | ICD-10-CM | POA: Diagnosis present

## 2014-09-06 DIAGNOSIS — Z809 Family history of malignant neoplasm, unspecified: Secondary | ICD-10-CM

## 2014-09-06 DIAGNOSIS — E871 Hypo-osmolality and hyponatremia: Secondary | ICD-10-CM

## 2014-09-06 DIAGNOSIS — Z7982 Long term (current) use of aspirin: Secondary | ICD-10-CM

## 2014-09-06 DIAGNOSIS — I2510 Atherosclerotic heart disease of native coronary artery without angina pectoris: Secondary | ICD-10-CM | POA: Diagnosis present

## 2014-09-06 DIAGNOSIS — N400 Enlarged prostate without lower urinary tract symptoms: Secondary | ICD-10-CM

## 2014-09-06 DIAGNOSIS — Z87891 Personal history of nicotine dependence: Secondary | ICD-10-CM

## 2014-09-06 MED ORDER — SODIUM CHLORIDE 0.9 % IV BOLUS (SEPSIS)
500.0000 mL | Freq: Once | INTRAVENOUS | Status: AC
  Administered 2014-09-07: 500 mL via INTRAVENOUS

## 2014-09-06 MED ORDER — ONDANSETRON HCL 4 MG/2ML IJ SOLN
4.0000 mg | Freq: Once | INTRAMUSCULAR | Status: AC
  Administered 2014-09-06: 4 mg via INTRAVENOUS
  Filled 2014-09-06: qty 2

## 2014-09-06 NOTE — ED Provider Notes (Signed)
CSN: [id number]     Arrival date & time 09/06/14  2326 History  This chart was scribed for Veryl Speak, MD by Lowella Petties, ED Scribe. The patient was seen in room APA02/APA02. Patient's care was started at 11:42 PM.   Chief Complaint  Patient presents with  . Emesis   Patient is a 80 y.o. male presenting with vomiting. The history is provided by the patient. No language interpreter was used.  Emesis Severity:  Moderate Duration:  7 hours Timing:  Intermittent Quality:  Unable to specify Progression:  Unchanged Chronicity:  New Relieved by:  Nothing Worsened by:  Nothing tried Ineffective treatments:  None tried Associated symptoms: fever   Associated symptoms: no diarrhea    HPI Comments: [name] is a 80 y.o. male who presents to the Emergency Department complaining of constant nausea and intermittent vomiting onset about 7 hours ago. He denies diarrhea. He reports a small bowel movement earlier today. He denies hematochezia, melena, hematemesis, and hematuria. He denies any history of heart problems. He reports a history of CAD, HTN, and AAA. He reports a surgical history of stent placemnt in his legs.  Past Medical History  Diagnosis Date  . Hypertension   . Hyperlipidemia   . Arthritis   . GERD (gastroesophageal reflux disease)   . Anemia   . Hiatal hernia   . Trouble swallowing   . AAA (abdominal aortic aneurysm)   . Macular degeneration   . Peripheral vascular disease   . CAD (coronary artery disease)    Past Surgical History  Procedure Laterality Date  . Tonsillectomy    . Coronary angioplasty with stent placement    . Back surgery  09-05-2011    . Angioplasty / stenting iliac  03-2011    Right Iliac - Gore Stent graft   . Spine surgery     Family History  Problem Relation Age of Onset  . Heart failure Mother   . Diabetes Mother   . Heart disease Mother   . Pneumonia Father   . Cancer Brother   . Diabetes Brother   . Heart disease Brother     History  Substance Use Topics  . Smoking status: Former Smoker    Types: Cigarettes    Quit date: 12/08/1990  . Smokeless tobacco: Current User    Types: Chew  . Alcohol Use: No    Review of Systems  Constitutional: Positive for fever.  Gastrointestinal: Positive for nausea and vomiting. Negative for diarrhea.  All other systems reviewed and are negative.  Allergies  Hydrocodone and Oxycodone  Home Medications   Prior to Admission medications   Medication Sig Start Date End Date Taking? Authorizing Provider  acetaminophen (TYLENOL) 500 MG tablet Take 1,000 mg by mouth every 6 (six) hours as needed. Pain.    Historical Provider, MD  alfuzosin (UROXATRAL) 10 MG 24 hr tablet Take 10 mg by mouth daily.      Historical Provider, MD  ALPRAZolam Duanne Moron) 0.25 MG tablet Take 0.25 mg by mouth 2 (two) times daily as needed for sleep or anxiety.     Historical Provider, MD  aspirin EC 81 MG tablet Take 81 mg by mouth daily.      Historical Provider, MD  atorvastatin (LIPITOR) 20 MG tablet Take 20 mg by mouth daily.      Historical Provider, MD  cetirizine (ZYRTEC) 5 MG tablet Take 5 mg by mouth daily as needed for allergies. Allergies.    Historical Provider, MD  clopidogrel (PLAVIX) 75 MG tablet Take 75 mg by mouth daily.      Historical Provider, MD  ferrous sulfate 325 (65 FE) MG tablet Take 325 mg by mouth daily with breakfast.    Historical Provider, MD  finasteride (PROSCAR) 5 MG tablet Take 5 mg by mouth daily.     Historical Provider, MD  lansoprazole (PREVACID) 30 MG capsule Take 30 mg by mouth daily. 10/22/13   Historical Provider, MD  meclizine (ANTIVERT) 50 MG tablet Take 0.5 tablets (25 mg total) by mouth 3 (three) times daily as needed for dizziness or nausea. 08/15/14   Orlie Dakin, MD  metoprolol succinate (TOPROL-XL) 25 MG 24 hr tablet Take 12.5 mg by mouth 2 (two) times daily. 11/21/13   Historical Provider, MD  Multiple Vitamin (MULTIVITAMIN PO) Take 1 tablet by mouth  daily.     Historical Provider, MD  Multiple Vitamins-Minerals (ICAPS MV PO) Take 1 capsule by mouth at bedtime.     Historical Provider, MD  Polyethylene Glycol 3350 (MIRALAX PO) Take 17 g by mouth at bedtime. Constipation.    Historical Provider, MD  pregabalin (LYRICA) 50 MG capsule Take 50 mg by mouth at bedtime.    Historical Provider, MD  Psyllium (METAMUCIL PO) Take 1 tablet by mouth every morning.     Historical Provider, MD   Triage Vitals: BP 144/56  Pulse 81  Temp(Src) 102.6 F (39.2 C) (Oral)  Resp 18  Ht 5\' 9"  (1.753 m)  Wt 157 lb (71.215 kg)  BMI 23.17 kg/m2  SpO2 95% Physical Exam  Nursing note and vitals reviewed. Constitutional: He is oriented to person, place, and time. He appears well-developed and well-nourished. No distress.  HENT:  Head: Normocephalic and atraumatic.  Mouth/Throat: Oropharynx is clear and moist.  Eyes: Conjunctivae and EOM are normal. Pupils are equal, round, and reactive to light.  Neck: Normal range of motion. Neck supple. No tracheal deviation present.  Cardiovascular: Normal rate, regular rhythm and normal heart sounds.   No murmur heard. Pulmonary/Chest: Effort normal and breath sounds normal. No respiratory distress. He has no wheezes. He has no rales.  Abdominal: Soft. Bowel sounds are normal. He exhibits no distension. There is no tenderness.  Musculoskeletal: Normal range of motion. He exhibits no edema.  Neurological: He is alert and oriented to person, place, and time.  Skin: Skin is warm and dry.  Warm to touch.  Psychiatric: He has a normal mood and affect. His behavior is normal.    ED Course  Procedures (including critical care time)  COORDINATION OF CARE: 11:46 PM-Discussed treatment plan which includes CXR, UA, and lab work with pt at bedside and pt agreed to plan.   Labs Review Labs Reviewed - No data to display  Imaging Review No results found.   EKG Interpretation   Date/Time:  Wednesday September 06 2014  23:44:36 EDT Ventricular Rate:  83 PR Interval:  177 QRS Duration: 96 QT Interval:  384 QTC Calculation: 451 R Axis:   60 Text Interpretation:  Sinus rhythm Atrial premature complex Nonspecific  repol abnormality, diffuse leads Changes noted from 08/15/14 Confirmed by  DELOS  MD, [place] ([id number]) on 09/06/2014 11:56:21 PM      MDM   Final diagnoses:  None    Patient presents with nausea and vomiting that started at approximately 4:00 this afternoon. Upon arrival to the ER, he was febrile with a temperature of 102.6. Workup reveals right-sided infiltrates on the chest x-ray suggestive of pneumonia. He will be  treated with Rocephin and Zithromax and admitted to the hospitalist service. There are nonspecific T wave changes noted, the significance of which I am uncertain. The troponin is negative.   I personally performed the services described in this documentation, which was scribed in my presence. The recorded information has been reviewed and is accurate.       Veryl Speak, MD 09/07/14 [phone]

## 2014-09-06 NOTE — ED Notes (Signed)
Patient states "sick on my stomach" vomiting started at 1600 today after eating a cheeseburger. Patient denies diarrhea, but c/o nausea and vomiting. Patient is A&OX4

## 2014-09-06 NOTE — Telephone Encounter (Signed)
Form signed by Dr Gwenlyn Found and faxed back

## 2014-09-07 DIAGNOSIS — I10 Essential (primary) hypertension: Secondary | ICD-10-CM | POA: Diagnosis not present

## 2014-09-07 DIAGNOSIS — Z833 Family history of diabetes mellitus: Secondary | ICD-10-CM | POA: Diagnosis not present

## 2014-09-07 DIAGNOSIS — Z9861 Coronary angioplasty status: Secondary | ICD-10-CM | POA: Diagnosis not present

## 2014-09-07 DIAGNOSIS — Z809 Family history of malignant neoplasm, unspecified: Secondary | ICD-10-CM | POA: Diagnosis not present

## 2014-09-07 DIAGNOSIS — Z79899 Other long term (current) drug therapy: Secondary | ICD-10-CM | POA: Diagnosis not present

## 2014-09-07 DIAGNOSIS — M1990 Unspecified osteoarthritis, unspecified site: Secondary | ICD-10-CM | POA: Diagnosis not present

## 2014-09-07 DIAGNOSIS — R112 Nausea with vomiting, unspecified: Secondary | ICD-10-CM | POA: Diagnosis present

## 2014-09-07 DIAGNOSIS — Z66 Do not resuscitate: Secondary | ICD-10-CM | POA: Diagnosis not present

## 2014-09-07 DIAGNOSIS — E871 Hypo-osmolality and hyponatremia: Secondary | ICD-10-CM

## 2014-09-07 DIAGNOSIS — R251 Tremor, unspecified: Secondary | ICD-10-CM | POA: Diagnosis present

## 2014-09-07 DIAGNOSIS — I2510 Atherosclerotic heart disease of native coronary artery without angina pectoris: Secondary | ICD-10-CM | POA: Diagnosis not present

## 2014-09-07 DIAGNOSIS — K219 Gastro-esophageal reflux disease without esophagitis: Secondary | ICD-10-CM

## 2014-09-07 DIAGNOSIS — J159 Unspecified bacterial pneumonia: Secondary | ICD-10-CM | POA: Diagnosis not present

## 2014-09-07 DIAGNOSIS — G9340 Encephalopathy, unspecified: Secondary | ICD-10-CM | POA: Diagnosis not present

## 2014-09-07 DIAGNOSIS — Z7902 Long term (current) use of antithrombotics/antiplatelets: Secondary | ICD-10-CM | POA: Diagnosis not present

## 2014-09-07 DIAGNOSIS — Z7982 Long term (current) use of aspirin: Secondary | ICD-10-CM | POA: Diagnosis not present

## 2014-09-07 DIAGNOSIS — I739 Peripheral vascular disease, unspecified: Secondary | ICD-10-CM

## 2014-09-07 DIAGNOSIS — D696 Thrombocytopenia, unspecified: Secondary | ICD-10-CM

## 2014-09-07 DIAGNOSIS — D649 Anemia, unspecified: Secondary | ICD-10-CM | POA: Diagnosis not present

## 2014-09-07 DIAGNOSIS — J189 Pneumonia, unspecified organism: Secondary | ICD-10-CM | POA: Diagnosis not present

## 2014-09-07 DIAGNOSIS — E785 Hyperlipidemia, unspecified: Secondary | ICD-10-CM | POA: Diagnosis not present

## 2014-09-07 DIAGNOSIS — E876 Hypokalemia: Secondary | ICD-10-CM | POA: Diagnosis not present

## 2014-09-07 DIAGNOSIS — N400 Enlarged prostate without lower urinary tract symptoms: Secondary | ICD-10-CM | POA: Diagnosis not present

## 2014-09-07 DIAGNOSIS — Z87891 Personal history of nicotine dependence: Secondary | ICD-10-CM | POA: Diagnosis not present

## 2014-09-07 DIAGNOSIS — H3530 Unspecified macular degeneration: Secondary | ICD-10-CM | POA: Diagnosis not present

## 2014-09-07 DIAGNOSIS — A419 Sepsis, unspecified organism: Secondary | ICD-10-CM

## 2014-09-07 DIAGNOSIS — R0902 Hypoxemia: Secondary | ICD-10-CM | POA: Diagnosis not present

## 2014-09-07 DIAGNOSIS — Z8249 Family history of ischemic heart disease and other diseases of the circulatory system: Secondary | ICD-10-CM | POA: Diagnosis not present

## 2014-09-07 LAB — COMPREHENSIVE METABOLIC PANEL
ALK PHOS: 62 U/L (ref 39–117)
ALT: 16 U/L (ref 0–53)
ANION GAP: 10 (ref 5–15)
AST: 28 U/L (ref 0–37)
Albumin: 3.4 g/dL — ABNORMAL LOW (ref 3.5–5.2)
BUN: 17 mg/dL (ref 6–23)
CALCIUM: 8.4 mg/dL (ref 8.4–10.5)
CO2: 25 mEq/L (ref 19–32)
Chloride: 93 mEq/L — ABNORMAL LOW (ref 96–112)
Creatinine, Ser: 0.87 mg/dL (ref 0.50–1.35)
GFR calc non Af Amer: 72 mL/min — ABNORMAL LOW (ref >90)
GFR, EST AFRICAN AMERICAN: 83 mL/min — AB (ref >90)
GLUCOSE: 130 mg/dL — AB (ref 70–99)
POTASSIUM: 3.3 meq/L — AB (ref 3.7–5.3)
Sodium: 128 mEq/L — ABNORMAL LOW (ref 137–147)
Total Bilirubin: 0.7 mg/dL (ref 0.3–1.2)
Total Protein: 6.5 g/dL (ref 6.0–8.3)

## 2014-09-07 LAB — CBC WITH DIFFERENTIAL/PLATELET
Basophils Absolute: 0 10*3/uL (ref 0.0–0.1)
Basophils Relative: 0 % (ref 0–1)
EOS ABS: 0 10*3/uL (ref 0.0–0.7)
Eosinophils Relative: 0 % (ref 0–5)
HCT: 34 % — ABNORMAL LOW (ref 39.0–52.0)
HEMOGLOBIN: 11.6 g/dL — AB (ref 13.0–17.0)
LYMPHS ABS: 0.2 10*3/uL — AB (ref 0.7–4.0)
Lymphocytes Relative: 3 % — ABNORMAL LOW (ref 12–46)
MCH: 30.7 pg (ref 26.0–34.0)
MCHC: 34.1 g/dL (ref 30.0–36.0)
MCV: 89.9 fL (ref 78.0–100.0)
Monocytes Absolute: 0.3 10*3/uL (ref 0.1–1.0)
Monocytes Relative: 5 % (ref 3–12)
Neutro Abs: 5 10*3/uL (ref 1.7–7.7)
Neutrophils Relative %: 92 % — ABNORMAL HIGH (ref 43–77)
Platelets: 123 10*3/uL — ABNORMAL LOW (ref 150–400)
RBC: 3.78 MIL/uL — ABNORMAL LOW (ref 4.22–5.81)
RDW: 14.1 % (ref 11.5–15.5)
WBC: 5.4 10*3/uL (ref 4.0–10.5)

## 2014-09-07 LAB — TROPONIN I: Troponin I: <0.3 ng/mL (ref <0.30)

## 2014-09-07 LAB — BASIC METABOLIC PANEL
ANION GAP: 10 (ref 5–15)
BUN: 15 mg/dL (ref 6–23)
CALCIUM: 8.3 mg/dL — AB (ref 8.4–10.5)
CO2: 26 mEq/L (ref 19–32)
CREATININE: 0.92 mg/dL (ref 0.50–1.35)
Chloride: 96 mEq/L (ref 96–112)
GFR calc Af Amer: 81 mL/min — ABNORMAL LOW (ref >90)
GFR, EST NON AFRICAN AMERICAN: 70 mL/min — AB (ref >90)
Glucose, Bld: 132 mg/dL — ABNORMAL HIGH (ref 70–99)
Potassium: 3.5 mEq/L — ABNORMAL LOW (ref 3.7–5.3)
SODIUM: 132 meq/L — AB (ref 137–147)

## 2014-09-07 LAB — CBC
HCT: 31.3 % — ABNORMAL LOW (ref 39.0–52.0)
Hemoglobin: 10.7 g/dL — ABNORMAL LOW (ref 13.0–17.0)
MCH: 31.1 pg (ref 26.0–34.0)
MCHC: 34.2 g/dL (ref 30.0–36.0)
MCV: 91 fL (ref 78.0–100.0)
PLATELETS: 106 10*3/uL — AB (ref 150–400)
RBC: 3.44 MIL/uL — ABNORMAL LOW (ref 4.22–5.81)
RDW: 14.4 % (ref 11.5–15.5)
WBC: 9.9 10*3/uL (ref 4.0–10.5)

## 2014-09-07 LAB — URINALYSIS, ROUTINE W REFLEX MICROSCOPIC
Bilirubin Urine: NEGATIVE
GLUCOSE, UA: NEGATIVE mg/dL
LEUKOCYTES UA: NEGATIVE
Nitrite: NEGATIVE
PROTEIN: NEGATIVE mg/dL
SPECIFIC GRAVITY, URINE: 1.01 (ref 1.005–1.030)
UROBILINOGEN UA: 0.2 mg/dL (ref 0.0–1.0)
pH: 6 (ref 5.0–8.0)

## 2014-09-07 LAB — MRSA PCR SCREENING: MRSA by PCR: NEGATIVE

## 2014-09-07 LAB — URINE MICROSCOPIC-ADD ON

## 2014-09-07 LAB — LACTIC ACID, PLASMA: LACTIC ACID, VENOUS: 1.4 mmol/L (ref 0.5–2.2)

## 2014-09-07 MED ORDER — PNEUMOCOCCAL VAC POLYVALENT 25 MCG/0.5ML IJ INJ
0.5000 mL | INJECTION | INTRAMUSCULAR | Status: AC
  Administered 2014-09-08: 0.5 mL via INTRAMUSCULAR
  Filled 2014-09-07: qty 0.5

## 2014-09-07 MED ORDER — METOPROLOL TARTRATE 25 MG PO TABS
12.5000 mg | ORAL_TABLET | Freq: Two times a day (BID) | ORAL | Status: DC
  Administered 2014-09-07 – 2014-09-09 (×4): 12.5 mg via ORAL
  Filled 2014-09-07 (×9): qty 1

## 2014-09-07 MED ORDER — DEXTROSE 5 % IV SOLN
500.0000 mg | Freq: Once | INTRAVENOUS | Status: AC
  Administered 2014-09-07: 500 mg via INTRAVENOUS

## 2014-09-07 MED ORDER — ACETAMINOPHEN 325 MG PO TABS
650.0000 mg | ORAL_TABLET | Freq: Three times a day (TID) | ORAL | Status: DC | PRN
  Administered 2014-09-07 – 2014-09-09 (×3): 650 mg via ORAL
  Filled 2014-09-07 (×4): qty 2

## 2014-09-07 MED ORDER — PANTOPRAZOLE SODIUM 40 MG PO TBEC
40.0000 mg | DELAYED_RELEASE_TABLET | Freq: Every day | ORAL | Status: DC
  Administered 2014-09-07 – 2014-09-09 (×3): 40 mg via ORAL
  Filled 2014-09-07 (×3): qty 1

## 2014-09-07 MED ORDER — ASPIRIN EC 81 MG PO TBEC
81.0000 mg | DELAYED_RELEASE_TABLET | Freq: Every day | ORAL | Status: DC
  Administered 2014-09-07 – 2014-09-09 (×3): 81 mg via ORAL
  Filled 2014-09-07 (×3): qty 1

## 2014-09-07 MED ORDER — ATORVASTATIN CALCIUM 20 MG PO TABS
20.0000 mg | ORAL_TABLET | Freq: Every day | ORAL | Status: DC
  Administered 2014-09-07 – 2014-09-08 (×2): 20 mg via ORAL
  Filled 2014-09-07 (×2): qty 1

## 2014-09-07 MED ORDER — PREGABALIN 50 MG PO CAPS
50.0000 mg | ORAL_CAPSULE | Freq: Every day | ORAL | Status: DC
  Administered 2014-09-07 – 2014-09-08 (×2): 50 mg via ORAL
  Filled 2014-09-07 (×2): qty 1

## 2014-09-07 MED ORDER — SODIUM CHLORIDE 0.9 % IV SOLN: INTRAVENOUS | Status: DC

## 2014-09-07 MED ORDER — DEXTROSE 5 % IV SOLN
250.0000 mg | INTRAVENOUS | Status: DC
  Filled 2014-09-07: qty 250

## 2014-09-07 MED ORDER — INFLUENZA VAC SPLIT QUAD 0.5 ML IM SUSY
0.5000 mL | PREFILLED_SYRINGE | INTRAMUSCULAR | Status: AC
  Administered 2014-09-08: 0.5 mL via INTRAMUSCULAR
  Filled 2014-09-07: qty 0.5

## 2014-09-07 MED ORDER — ALFUZOSIN HCL ER 10 MG PO TB24
10.0000 mg | ORAL_TABLET | Freq: Every day | ORAL | Status: DC
  Administered 2014-09-07 – 2014-09-09 (×3): 10 mg via ORAL
  Filled 2014-09-07 (×5): qty 1

## 2014-09-07 MED ORDER — DEXTROSE 5 % IV SOLN
1.0000 g | Freq: Once | INTRAVENOUS | Status: AC
  Administered 2014-09-07: 1 g via INTRAVENOUS
  Filled 2014-09-07: qty 10

## 2014-09-07 MED ORDER — HEPARIN SODIUM (PORCINE) 5000 UNIT/ML IJ SOLN
5000.0000 [IU] | Freq: Three times a day (TID) | INTRAMUSCULAR | Status: DC
  Administered 2014-09-07: 5000 [IU] via SUBCUTANEOUS
  Filled 2014-09-07: qty 1

## 2014-09-07 MED ORDER — DEXTROSE 5 % IV SOLN
1.0000 g | INTRAVENOUS | Status: DC
  Administered 2014-09-07 – 2014-09-08 (×2): 1 g via INTRAVENOUS
  Filled 2014-09-07 (×4): qty 10

## 2014-09-07 MED ORDER — ONDANSETRON HCL 4 MG/2ML IJ SOLN: 4.0000 mg | Freq: Four times a day (QID) | INTRAMUSCULAR | Status: DC | PRN

## 2014-09-07 MED ORDER — DEXTROSE 5 % IV SOLN
INTRAVENOUS | Status: AC
  Filled 2014-09-07: qty 500

## 2014-09-07 MED ORDER — DEXTROSE 5 % IV SOLN
500.0000 mg | INTRAVENOUS | Status: DC
  Administered 2014-09-07 – 2014-09-08 (×2): 500 mg via INTRAVENOUS
  Filled 2014-09-07 (×4): qty 500

## 2014-09-07 MED ORDER — POTASSIUM CHLORIDE CRYS ER 20 MEQ PO TBCR
40.0000 meq | EXTENDED_RELEASE_TABLET | Freq: Once | ORAL | Status: AC
  Administered 2014-09-07: 40 meq via ORAL
  Filled 2014-09-07: qty 2

## 2014-09-07 MED ORDER — ALPRAZOLAM 0.25 MG PO TABS
0.2500 mg | ORAL_TABLET | Freq: Two times a day (BID) | ORAL | Status: DC | PRN
  Administered 2014-09-07 – 2014-09-08 (×2): 0.25 mg via ORAL
  Filled 2014-09-07 (×2): qty 1

## 2014-09-07 MED ORDER — FINASTERIDE 5 MG PO TABS
5.0000 mg | ORAL_TABLET | Freq: Every day | ORAL | Status: DC
Start: 2014-09-07 — End: 2014-09-09
  Administered 2014-09-08 – 2014-09-09 (×2): 5 mg via ORAL
  Filled 2014-09-07 (×5): qty 1

## 2014-09-07 MED ORDER — ONDANSETRON HCL 4 MG PO TABS: 4.0000 mg | ORAL_TABLET | Freq: Four times a day (QID) | ORAL | Status: DC | PRN

## 2014-09-07 MED ORDER — POTASSIUM CHLORIDE CRYS ER 20 MEQ PO TBCR
40.0000 meq | EXTENDED_RELEASE_TABLET | Freq: Two times a day (BID) | ORAL | Status: AC
  Administered 2014-09-07 (×2): 40 meq via ORAL
  Filled 2014-09-07 (×2): qty 2

## 2014-09-07 MED ORDER — CLOPIDOGREL BISULFATE 75 MG PO TABS
75.0000 mg | ORAL_TABLET | Freq: Every day | ORAL | Status: DC
  Administered 2014-09-07 – 2014-09-09 (×3): 75 mg via ORAL
  Filled 2014-09-07 (×3): qty 1

## 2014-09-07 NOTE — Progress Notes (Signed)
UR chart review completed.  

## 2014-09-07 NOTE — Progress Notes (Signed)
  PROGRESS NOTE  [name] FGH:[id number] DOB: March 27, 1918 DOA: 09/06/2014 PCP: Philis Fendt, MD  Summary: 89yom presented with n/v. Found to have pneumonia.  Assessment/Plan: 1. CAP with possible sepsis on admission.  2. Acute encephalopathy, resolved, secondary to CAP. 3. N/v; no diarrhea. Resolved. Tolerating breakfast. 4. Hyponatremia, hypokalemia. 5. Thrombocytopenia, chronic etiology unclear, f/u as outpatient. No heparin. 6. Nonspecific t-wave changes. No CP. Check troponin x1 if negative no further evaluation planned. 7. H/o CAD, PVD on Plavix for iliac stent.   Improving, plan to continue empiric abx, check sputum culture. Transfer to floor.  Replace potassium  BMP in AM, PT consult  Stop heparin  Check troponin x1  Patient requests DNR status.   Discussed with HCPOA/daughter at bedside who concurs.  Code Status: DNR DVT prophylaxis: SCDs Family Communication:  Disposition Plan: home  Jeremy Hodgkins, MD  [facility]  Pager [phone] If 7PM-7AM, please contact night-coverage at [email or web address], password [email or web address] 09/07/2014, 7:39 AM  LOS: 1 day   Consultants:    Procedures:    Antibiotics:  Zithromax 10/1 >>  ceftiaxone 10/1 >>  HPI/Subjective: Feels much better today. N/v has resolved. No abdominal pain. Breathing fine. No complaints. Ate breakfast. No CP.  Objective: Filed Vitals:   09/07/14 0200 09/07/14 0345 09/07/14 0400 09/07/14 0500  BP: 150/61 105/31 105/31 105/32  Pulse: 76 75 76   Temp:   101.4 F (38.6 C)   TempSrc:   Oral   Resp: 26 24 22 24   Height:      Weight:      SpO2: 100% 100% 92%     Intake/Output Summary (Last 24 hours) at 09/07/14 0739 Last data filed at 09/07/14 0600  Gross per 24 hour  Intake    575 ml  Output    200 ml  Net    375 ml     Filed Weights   09/06/14 2350  Weight: 71.215 kg (157 lb)    Exam:     Tm 102.6, VSS, hypoxia stable on   General: appears calm and comfortable  Psych:  alert, speech fluent and clear  CV: RRR no m/r/g. No LE edema.   Respiratory: CTA left no w/r/r. Normal resp effort. Diminished BS on the right.  Abdomen: soft ntnd  Data Reviewed:  Na 132, K 3.5.  Plt 106   Scheduled Meds: . sodium chloride   Intravenous STAT  . alfuzosin  10 mg Oral Daily  . aspirin EC  81 mg Oral Daily  . atorvastatin  20 mg Oral QPC supper  . azithromycin  250 mg Intravenous Q24H  . cefTRIAXone (ROCEPHIN)  IV  1 g Intravenous Q24H  . clopidogrel  75 mg Oral QAC breakfast  . finasteride  5 mg Oral Daily  . heparin  5,000 Units Subcutaneous 3 times per day  . [START ON 09/08/2014] Influenza vac split quadrivalent PF  0.5 mL Intramuscular Tomorrow-1000  . metoprolol tartrate  12.5 mg Oral BID  . pantoprazole  40 mg Oral Daily  . [START ON 09/08/2014] pneumococcal 23 valent vaccine  0.5 mL Intramuscular Tomorrow-1000  . pregabalin  50 mg Oral QHS   Continuous Infusions: . sodium chloride      Principal Problem:   Community acquired pneumonia Active Problems:   Sepsis   Acute encephalopathy   Thrombocytopenia   Time spent 25 minutes

## 2014-09-07 NOTE — H&P (Signed)
[facility] History and Physical  [name] KKX:[id number] DOB: 1918-03-22 DOA: 09/06/2014  Referring physician: Dr Stark Jock PCP: Jeremy Fendt, MD  Specialists: none  Chief Complaint: nausea and vomiting  Assessment/Plan Active Problems: Community acquired pneumonia HTN CAD PVD  GERD BPH Hyponatremia Hypokalemia   CAP: RU and middle lobes. CTX and Azitrho started in ED. WBC 5.4. O2 mid to low 90s w/o O2.  - Admit to tele - CTX 1gm Qday, Azithro 250 IV Qday - f/u BCX - CBC in am - O2 PRN - PT/OT - Discuss code status w/ pt in am when more awake  Nausea and vomiting: likely secondary to pneumonia but may have secondary viral gastroenteritis - Zofran PRN - IVF NS 142ml/hr  Hyponatremia/Hypokalemia: likely secondary to GI loss. Na 128 and K 3.3.  - IVF as above - Kdur 53mEq x1  HTN: well controlled - continue home metoprolol  PVD: 2012 iliac stent. 2+ distal pulses - continue plavix  GERD:  - protonix  BPH:  Continue home alfuzosin, and proscar  DVT Prophylaxis: Hep [place] TID  Code Status: FULL Family Communication: No family present at time of admission Disposition Plan:    HPI: [name] is a 80 y.o. Gilmore came to [facility] ed 09/07/2014 with  nasea and vomiting. At time of pt encounter in ED pt very sleepy and tired, thus limiting history. Pt not able to give much of a history as he is also hard of hearing. Pt was oriented to person and place at time of exam. Acute onset nausea and vomiting today. Intermittent. Denies fevers, diarrhea. Feeling some SOB today.   Review of Systems: Per HPI w/ all other systems negative.   Past Medical History  Diagnosis Date  . Hypertension   . Hyperlipidemia   . Arthritis   . GERD (gastroesophageal reflux disease)   . Anemia   . Hiatal hernia   . Trouble swallowing   . AAA (abdominal aortic aneurysm)   . Macular degeneration   . Peripheral vascular disease   . CAD (coronary artery disease)    Past Surgical  History  Procedure Laterality Date  . Tonsillectomy    . Coronary angioplasty with stent placement    . Back surgery  09-05-2011    . Angioplasty / stenting iliac  03-2011    Right Iliac - Gore Stent graft   . Spine surgery     Social History:  History   Social History Narrative  . No narrative on file    Allergies  Allergen Reactions  . Hydrocodone Anaphylaxis and Nausea And Vomiting  . Oxycodone Nausea And Vomiting    Family History  Problem Relation Age of Onset  . Heart failure Mother   . Diabetes Mother   . Heart disease Mother   . Pneumonia Father   . Cancer Brother   . Diabetes Brother   . Heart disease Brother      Prior to Admission medications   Medication Sig Start Date End Date Taking? Authorizing Provider  acetaminophen (TYLENOL) 500 MG tablet Take 1,000 mg by mouth every 6 (six) hours as needed. Pain.   Yes Historical Provider, MD  alfuzosin (UROXATRAL) 10 MG 24 hr tablet Take 10 mg by mouth daily.     Yes Historical Provider, MD  ALPRAZolam (XANAX) 0.25 MG tablet Take 0.25 mg by mouth 2 (two) times daily as needed for sleep or anxiety.    Yes Historical Provider, MD  aspirin EC 81 MG tablet Take 81  mg by mouth daily.     Yes Historical Provider, MD  atorvastatin (LIPITOR) 20 MG tablet Take 20 mg by mouth daily.     Yes Historical Provider, MD  cetirizine (ZYRTEC) 5 MG tablet Take 5 mg by mouth daily as needed for allergies. Allergies.   Yes Historical Provider, MD  clopidogrel (PLAVIX) 75 MG tablet Take 75 mg by mouth daily.     Yes Historical Provider, MD  ferrous sulfate 325 (65 FE) MG tablet Take 325 mg by mouth daily with breakfast.   Yes Historical Provider, MD  finasteride (PROSCAR) 5 MG tablet Take 5 mg by mouth daily.    Yes Historical Provider, MD  lansoprazole (PREVACID) 30 MG capsule Take 30 mg by mouth daily. 10/22/13  Yes Historical Provider, MD  meclizine (ANTIVERT) 50 MG tablet Take 0.5 tablets (25 mg total) by mouth 3 (three) times daily as  needed for dizziness or nausea. 08/15/14  Yes Orlie Dakin, MD  metoprolol succinate (TOPROL-XL) 25 MG 24 hr tablet Take 12.5 mg by mouth 2 (two) times daily. 11/21/13  Yes Historical Provider, MD  Multiple Vitamin (MULTIVITAMIN PO) Take 1 tablet by mouth daily.    Yes Historical Provider, MD  Multiple Vitamins-Minerals (ICAPS MV PO) Take 1 capsule by mouth at bedtime.    Yes Historical Provider, MD  Polyethylene Glycol 3350 (MIRALAX PO) Take 17 g by mouth at bedtime. Constipation.   Yes Historical Provider, MD  pregabalin (LYRICA) 50 MG capsule Take 50 mg by mouth at bedtime.   Yes Historical Provider, MD  Psyllium (METAMUCIL PO) Take 1 tablet by mouth every morning.    Yes Historical Provider, MD   Physical Exam: Filed Vitals:   09/06/14 2350 09/07/14 0000 09/07/14 0159 09/07/14 0200  BP: 144/56 155/60 142/56 150/61  Pulse: 81 85 77 76  Temp: 102.6 F (39.2 C)  98.9 F (37.2 C)   TempSrc: Oral  Oral   Resp: 18 27 18 26   Height: 5\' 9"  (1.753 m)     Weight: 71.215 kg (157 lb)     SpO2: 95% 100% 100% 100%     General:  Sleeping bur arousable, NAD  Eyes: EOMI   ENT: Dry mm,  Neck: FROM  Cardiovascular: faint heart sounds RRR, no m/r/g  Respiratory: mild increased WOB, diffuse ronchi and crackles on R,   Abdomen: NABS, nonttp  Skin:  warm, well perfused, intact  Musculoskeletal: No LE edema, moves all extremities spontaneously   Psychiatric: nml affect  Neurologic: AOx3, CN2-12 Grossly intact, cerebellar fxn nml  Labs on Admission:  Basic Metabolic Panel:  Recent Labs Lab 09/07/14 0038  NA 128*  K 3.3*  CL 93*  CO2 25  GLUCOSE 130*  BUN 17  CREATININE 0.87  CALCIUM 8.4   Liver Function Tests:  Recent Labs Lab 09/07/14 0038  AST 28  ALT 16  ALKPHOS 62  BILITOT 0.7  PROT 6.5  ALBUMIN 3.4*   No results found for this basename: LIPASE, AMYLASE,  in the last 168 hours No results found for this basename: AMMONIA,  in the last 168 hours CBC:  Recent  Labs Lab 09/07/14 0038  WBC 5.4  NEUTROABS 5.0  HGB 11.6*  HCT 34.0*  MCV 89.9  PLT 123*   Cardiac Enzymes:  Recent Labs Lab 09/07/14 0038  TROPONINI <0.30    BNP (last 3 results) No results found for this basename: PROBNP,  in the last 8760 hours CBG: No results found for this basename: GLUCAP,  in  the last 168 hours  Radiological Exams on Admission: Dg Chest 2 View  09/07/2014   CLINICAL DATA:  Generalized weakness, nausea/ vomiting, fever  EXAM: CHEST  2 VIEW  COMPARISON:  08/07/2013  FINDINGS: Multifocal patchy opacities in the right upper and middle lobes, suspicious for pneumonia. Left lung is clear. No pleural effusion or pneumothorax.  The heart is top-normal in size.  Visualized osseous structures are within normal limits.  IMPRESSION: Multifocal patchy opacities in the right upper and middle lobes, suspicious for pneumonia.   Electronically Signed   By: Julian Hy M.D.   On: 09/07/2014 00:26       Time spent: >70 mi in direct pt care and coordination   Alicea Wente J, MD [facility] [email or web address] Password [email or web address] 09/07/2014, 2:49 AM

## 2014-09-07 NOTE — Evaluation (Signed)
Physical Therapy Evaluation Patient Details Name: [name] MRN: [id number] DOB: 30-Jan-1918 Today's Date: 09/07/2014   History of Present Illness  [name] is a 80 y.o. male came to [facility] ed 09/07/2014 with nasea and vomiting. At time of pt encounter in ED pt very sleepy and tired, thus limiting history. Pt not able to give much of a history as he is also hard of hearing. Pt was oriented to person and place at time of exam. Acute onset nausea and vomiting today. Intermittent. Denies fevers, diarrhea. Feeling some SOB today.   Clinical Impression   Pt was seen for a PT evaluation.  He had been sleeping and was reluctantly agreeable to work with me.  His daughter was present who describes pt as living alone with very strong family support.  He walks with a 4 wheeled walker at home and is fairly independent with all of his ADLs.  She stated that he has had major problems with his lumbar spine and now has RLE weakness.  On manual muscle test his hip and knee strength is very good, but ankle strength is only 2/5.  The LLE is 5/5 for his age.  He was able to transfer OOB without assistance.  He was able to walk 30' in the ICU room with a walker and stable gait.  He could have done much more but he said that he just didn't feel up to getting out "all over the hospital".  His O2 sat was 97-98% on room air.  I have asked his daughter to bring in his 4 wheeled walker for him to use while here as it was somewhat upsetting to the pt to have to use my 2 wheeled walker.  I offered to have HHPT at discharge but daughter states that pt is very independent and does not usually  Agree to this service.  He should actually do well at discharge and should not need anything further from [place].  I will ask nursing service to [facility] him in the hallway.    Follow Up Recommendations No PT follow up    Equipment Recommendations  None recommended by PT    Recommendations for Other Services   none    Precautions /  Restrictions Precautions Precautions: Fall Restrictions Weight Bearing Restrictions: No      Mobility  Bed Mobility Overal bed mobility: Modified Independent                Transfers Overall transfer level: Needs assistance Equipment used: Rolling walker (2 wheeled) Transfers: Sit to/from Stand Sit to Stand: Supervision         General transfer comment: pt able to stand to a walker with good stability...his major problem was in accepting all of the necessary ICU lines attached to him  Ambulation/Gait Ambulation/Gait assistance: Supervision Ambulation Distance (Feet): 30 Feet Assistive device: Rolling walker (2 wheeled) Gait Pattern/deviations: Trunk flexed;WFL(Within Functional Limits)   Gait velocity interpretation: at or above normal speed for age/gender General Gait Details: gait is stable with a walker...pt prefers a rollator so I have asked his daughter to bring in his walker tomorrow for him to use  Stairs            Wheelchair Mobility    Modified Rankin (Stroke Patients Only)       Balance Overall balance assessment: No apparent balance deficits (not formally assessed)  Pertinent Vitals/Pain Pain Assessment: No/denies pain    Home Living Family/patient expects to be discharged to:: Private residence Living Arrangements: Alone Available Help at Discharge: Family;Available PRN/intermittently Type of Home: House Home Access: Ramped entrance     Home Layout: One level Home Equipment: [address] - 4 wheels;Tub bench;Grab bars - tub/shower;Grab bars - toilet;Toilet riser;Hand held shower head;Wheelchair - manual      Prior Function Level of Independence: Independent with assistive device(s) (uses a Rollator)         Comments: Daughter reports that pt receives assist with house cleaning (has man come in about 1 every 10 days), and she assist in medication and financial management.  Pt  is indepenent in dressing, bathing, and meal prep tasks.     Hand Dominance   Dominant Hand: Right    Extremity/Trunk Assessment               Lower Extremity Assessment: Overall WFL for tasks assessed (except right ankle strength =2/5)      Cervical / Trunk Assessment: Kyphotic  Communication   Communication: HOH  Cognition Arousal/Alertness: Awake/alert Behavior During Therapy: WFL for tasks assessed/performed Overall Cognitive Status: Within Functional Limits for tasks assessed                      General Comments      Exercises        Assessment/Plan    PT Assessment Patent does not need any further PT services  PT Diagnosis     PT Problem List    PT Treatment Interventions     PT Goals (Current goals can be found in the Care Plan section) Acute Rehab PT Goals PT Goal Formulation: No goals set, d/c therapy    Frequency     Barriers to discharge  none      Co-evaluation               End of Session Equipment Utilized During Treatment: Gait belt Activity Tolerance: Patient tolerated treatment well Patient left: in bed;with call bell/phone within reach;with bed alarm set;with family/visitor present Nurse Communication: Mobility status         Time: 5638-9373 PT Time Calculation (min): 28 min   Charges:   PT Evaluation $Initial PT Evaluation Tier I: 1 Procedure     PT G CodesDemetrios Isaacs L 09/07/2014, 1:57 PM

## 2014-09-07 NOTE — Evaluation (Signed)
Occupational Therapy Evaluation Patient Details Name: [name] MRN: [id number] DOB: 1918-06-18 Today's Date: 09/07/2014    History of Present Illness [name] is a 80 y.o. male came to [facility] ed 09/07/2014 with nasea and vomiting. At time of pt encounter in ED pt very sleepy and tired, thus limiting history. Pt not able to give much of a history as he is also hard of hearing. Pt was oriented to person and place at time of exam. Acute onset nausea and vomiting today. Intermittent. Denies fevers, diarrhea. Feeling some SOB today.    Clinical Impression   Pt is presenting to acute OT with above situation.  He verbalizes feeling significantly better than yesterday.  Pt has WFL ROM, strength, coordination, and sensation in BUE.  He demonstrates good skills with ADLs tested and verbalizes feeling near to baseline.  Daughter reports some weakness in his LE, but that ADL skills appear WFL.  Pt needs no further OT services at this time.  Recommend PT evaluation for balance - pt uses rollator walker at baseline.    Follow Up Recommendations  No OT follow up    Equipment Recommendations  None recommended by OT    Recommendations for Other Services PT consult     Precautions / Restrictions Precautions Precautions: Fall Restrictions Weight Bearing Restrictions: No      Mobility Bed Mobility Overal bed mobility: Modified Independent                Transfers Overall transfer level: Needs assistance   Transfers: Sit to/from Stand Sit to Stand: Min guard              Balance                                            ADL Overall ADL's : At baseline;Modified independent                                             Vision                     Perception     Praxis      Pertinent Vitals/Pain Pain Assessment: No/denies pain     Hand Dominance Right   Extremity/Trunk Assessment Upper Extremity Assessment Upper Extremity  Assessment: Overall WFL for tasks assessed (Good ROM, strength, sensation, and coordination in BUE)   Lower Extremity Assessment Lower Extremity Assessment: Defer to PT evaluation       Communication Communication Communication: HOH   Cognition Arousal/Alertness: Awake/alert Behavior During Therapy: WFL for tasks assessed/performed Overall Cognitive Status: Within Functional Limits for tasks assessed                     General Comments       Exercises       Shoulder Instructions      Home Living Family/patient expects to be discharged to:: Private residence Living Arrangements: Alone (Daughter and her husband live next door) Available Help at Discharge: Family;Available PRN/intermittently (Daughter reports that someone can be avaialble around 80-90% of the time)   Home Access: Ramped entrance     Home Layout: One level     Bathroom Shower/Tub: Tub/shower unit;Walk-in shower   Bathroom Toilet: Standard  Home Equipment: [place] - 4 wheels;Tub bench;Grab bars - tub/shower;Grab bars - toilet;Toilet riser;Hand held shower head;Wheelchair - manual (Lift chair)          Prior Functioning/Environment Level of Independence: Independent with assistive device(s)        Comments: Daughter reports that pt receives assist with house cleaning (has man come in about 1 every 10 days), and she assist in medication and financial management.  Pt is indepenent in dressing, bathing, and meal prep tasks.    OT Diagnosis:     OT Problem List:     OT Treatment/Interventions:      OT Goals(Current goals can be found in the care plan section) Acute Rehab OT Goals Patient Stated Goal: No acute OT goals needed OT Goal Formulation: With patient  OT Frequency:     Barriers to D/C:            Co-evaluation              End of Session Equipment Utilized During Treatment: Gait belt  Activity Tolerance: Patient tolerated treatment well Patient left: in bed;with  family/visitor present;with call bell/phone within reach;with bed alarm set   Time: 8676-7209 OT Time Calculation (min): 34 min Charges:  OT General Charges $OT Visit: 1 Procedure OT Evaluation $Initial OT Evaluation Tier I: 1 Procedure G-Codes:      Bea Graff Icis Budreau, MS, OTR/L [place] [phone] 09/07/2014, 9:56 AM

## 2014-09-07 NOTE — ED Notes (Signed)
Patient resting in a position of comfort. No needs voiced at this time 

## 2014-09-08 DIAGNOSIS — D696 Thrombocytopenia, unspecified: Secondary | ICD-10-CM

## 2014-09-08 DIAGNOSIS — A419 Sepsis, unspecified organism: Secondary | ICD-10-CM | POA: Diagnosis not present

## 2014-09-08 LAB — BASIC METABOLIC PANEL
Anion gap: 11 (ref 5–15)
BUN: 16 mg/dL (ref 6–23)
CHLORIDE: 97 meq/L (ref 96–112)
CO2: 24 mEq/L (ref 19–32)
Calcium: 8.1 mg/dL — ABNORMAL LOW (ref 8.4–10.5)
Creatinine, Ser: 0.94 mg/dL (ref 0.50–1.35)
GFR calc non Af Amer: 69 mL/min — ABNORMAL LOW (ref >90)
GFR, EST AFRICAN AMERICAN: 80 mL/min — AB (ref >90)
Glucose, Bld: 90 mg/dL (ref 70–99)
POTASSIUM: 4.7 meq/L (ref 3.7–5.3)
Sodium: 132 mEq/L — ABNORMAL LOW (ref 137–147)

## 2014-09-08 LAB — STREP PNEUMONIAE URINARY ANTIGEN: Strep Pneumo Urinary Antigen: NEGATIVE

## 2014-09-08 NOTE — Care Management Note (Addendum)
    Page 1 of 1   09/08/2014     3:46:39 PM CARE MANAGEMENT NOTE 09/08/2014  Patient:  [name], [name]   Account Number:  [id number]  Date Initiated:  09/08/2014  Documentation initiated by:  Theophilus Kinds  Subjective/Objective Assessment:   Pt admitted from home with pneumonia. Pt lives alone and has very strong family support by children and family. Pt will return home at discharge. Pt has a rollator for home use.     Action/Plan:   PT and OT does not recommend any followup. No d/c needs anticipated.   Anticipated DC Date:  09/10/2014   Anticipated DC Plan:  [place]  CM consult      Choice offered to / List presented to:             Status of service:  Completed, signed off Medicare Important Message given?  YES (If response is "NO", the following Medicare IM given date fields will be blank) Date Medicare IM given:  09/08/2014 Medicare IM given by:  Theophilus Kinds Date Additional Medicare IM given:   Additional Medicare IM given by:    Discharge Disposition:  HOME/SELF CARE  Per UR Regulation:    If discussed at Long Length of Stay Meetings, dates discussed:    Comments:  09/08/14 [place], RN BSN CM Pt has now changed his mind about HH. Pt would like HH RN with [facility] at discharge. Weekend staff will need to arrange.  09/08/14 [place], RN BSN CM Pt is refusing any home health at this time. 09/08/14 [place], RN BSN CM

## 2014-09-08 NOTE — Progress Notes (Signed)
  PROGRESS NOTE  [name] YJE:[id number] DOB: 1918-11-29 DOA: 09/06/2014 PCP: Philis Fendt, MD  Summary: 89yom presented with n/v. Found to have pneumonia.  Assessment/Plan: 1. CAP with possible sepsis on admission. Improving rapidly, no hypoxia. 2. Acute encephalopathy, resolved, secondary to CAP. 3. N/v; no diarrhea. Resolved.  4. Hyponatremia stable, appears to be chronic. 5. Thrombocytopenia, chronic etiology unclear, f/u as outpatient. No heparin. 6. Nonspecific t-wave changes. No CP. troponins negative, no further evaluation. 7. H/o CAD, PVD on Plavix for iliac stent.   Much improved, likely switch to oral abx tomorrow. Anticipate home <48 hours.  Code Status: DNR DVT prophylaxis: SCDs Family Communication: none present Disposition Plan: home  [name], MD  [facility]  Pager [phone] If 7PM-7AM, please contact night-coverage at [email or web address], password [facility] 09/08/2014, 4:33 PM  LOS: 2 days   Consultants:  OT: no f/u  PT no f/u  Procedures:    Antibiotics:  Zithromax 10/1 >>  ceftiaxone 10/1 >>  HPI/Subjective: Feels much better. Breathing better. "I feel younger".  Objective: Filed Vitals:   09/07/14 1500 09/07/14 2103 09/08/14 0527 09/08/14 1446  BP:  118/47 125/49 130/51  Pulse:  64 68 64  Temp:  98.1 F (36.7 C) 98.8 F (37.1 C) 98.9 F (37.2 C)  TempSrc:  Oral Oral Oral  Resp: 21 20 20 20   Height:      Weight:      SpO2: 98% 92% 92% 95%    Intake/Output Summary (Last 24 hours) at 09/08/14 1633 Last data filed at 09/08/14 1300  Gross per 24 hour  Intake    890 ml  Output    725 ml  Net    165 ml     Filed Weights   09/06/14 2350  Weight: 71.215 kg (157 lb)    Exam:     Afebrile, VSS, no hypoxia  CV RRR no mrg. No LE edema  Resp CTA bilaterally no wrr. Normal resp effort.  Psych grossly normal mood and affect, speech fluent and clear  Data Reviewed:  BMP noted, Na 132, stable.  Scheduled Meds: .  alfuzosin  10 mg Oral Daily  . aspirin EC  81 mg Oral Daily  . atorvastatin  20 mg Oral QPC supper  . azithromycin  500 mg Intravenous Q24H  . cefTRIAXone (ROCEPHIN)  IV  1 g Intravenous Q24H  . clopidogrel  75 mg Oral QAC breakfast  . finasteride  5 mg Oral Daily  . metoprolol tartrate  12.5 mg Oral BID  . pantoprazole  40 mg Oral Daily  . pregabalin  50 mg Oral QHS   Continuous Infusions:    Principal Problem:   Community acquired pneumonia Active Problems:   Sepsis   Acute encephalopathy   Thrombocytopenia   Time spent 15 minutes

## 2014-09-09 ENCOUNTER — Emergency Department (HOSPITAL_COMMUNITY)
Admission: EM | Admit: 2014-09-09 | Discharge: 2014-09-09 | Disposition: A | Discharge status: Home / Self Care | Payer: Medicare Other | Attending: Emergency Medicine | Admitting: Emergency Medicine

## 2014-09-09 ENCOUNTER — Encounter (HOSPITAL_COMMUNITY): Payer: Self-pay | Admitting: Emergency Medicine

## 2014-09-09 ENCOUNTER — Emergency Department (HOSPITAL_COMMUNITY): Payer: Medicare Other

## 2014-09-09 DIAGNOSIS — I10 Essential (primary) hypertension: Secondary | ICD-10-CM | POA: Insufficient documentation

## 2014-09-09 DIAGNOSIS — I2510 Atherosclerotic heart disease of native coronary artery without angina pectoris: Secondary | ICD-10-CM | POA: Insufficient documentation

## 2014-09-09 DIAGNOSIS — Z79899 Other long term (current) drug therapy: Secondary | ICD-10-CM | POA: Insufficient documentation

## 2014-09-09 DIAGNOSIS — A419 Sepsis, unspecified organism: Principal | ICD-10-CM

## 2014-09-09 DIAGNOSIS — E785 Hyperlipidemia, unspecified: Secondary | ICD-10-CM | POA: Insufficient documentation

## 2014-09-09 DIAGNOSIS — J189 Pneumonia, unspecified organism: Secondary | ICD-10-CM

## 2014-09-09 DIAGNOSIS — D649 Anemia, unspecified: Secondary | ICD-10-CM | POA: Insufficient documentation

## 2014-09-09 DIAGNOSIS — H3530 Unspecified macular degeneration: Secondary | ICD-10-CM | POA: Insufficient documentation

## 2014-09-09 DIAGNOSIS — J159 Unspecified bacterial pneumonia: Secondary | ICD-10-CM | POA: Diagnosis not present

## 2014-09-09 DIAGNOSIS — M1990 Unspecified osteoarthritis, unspecified site: Secondary | ICD-10-CM | POA: Insufficient documentation

## 2014-09-09 DIAGNOSIS — Z7902 Long term (current) use of antithrombotics/antiplatelets: Secondary | ICD-10-CM | POA: Insufficient documentation

## 2014-09-09 DIAGNOSIS — Z7982 Long term (current) use of aspirin: Secondary | ICD-10-CM | POA: Insufficient documentation

## 2014-09-09 DIAGNOSIS — K219 Gastro-esophageal reflux disease without esophagitis: Secondary | ICD-10-CM | POA: Insufficient documentation

## 2014-09-09 DIAGNOSIS — Z9861 Coronary angioplasty status: Secondary | ICD-10-CM | POA: Insufficient documentation

## 2014-09-09 DIAGNOSIS — Z87891 Personal history of nicotine dependence: Secondary | ICD-10-CM | POA: Insufficient documentation

## 2014-09-09 DIAGNOSIS — I739 Peripheral vascular disease, unspecified: Secondary | ICD-10-CM | POA: Insufficient documentation

## 2014-09-09 LAB — CBC WITH DIFFERENTIAL/PLATELET
Basophils Absolute: 0 10*3/uL (ref 0.0–0.1)
Basophils Relative: 0 % (ref 0–1)
Eosinophils Absolute: 0 10*3/uL (ref 0.0–0.7)
Eosinophils Relative: 0 % (ref 0–5)
HCT: 33.4 % — ABNORMAL LOW (ref 39.0–52.0)
HEMOGLOBIN: 11.5 g/dL — AB (ref 13.0–17.0)
LYMPHS ABS: 0.4 10*3/uL — AB (ref 0.7–4.0)
LYMPHS PCT: 6 % — AB (ref 12–46)
MCH: 31.1 pg (ref 26.0–34.0)
MCHC: 34.4 g/dL (ref 30.0–36.0)
MCV: 90.3 fL (ref 78.0–100.0)
MONOS PCT: 7 % (ref 3–12)
Monocytes Absolute: 0.5 10*3/uL (ref 0.1–1.0)
Neutro Abs: 6 10*3/uL (ref 1.7–7.7)
Neutrophils Relative %: 86 % — ABNORMAL HIGH (ref 43–77)
Platelets: 119 10*3/uL — ABNORMAL LOW (ref 150–400)
RBC: 3.7 MIL/uL — AB (ref 4.22–5.81)
RDW: 14.2 % (ref 11.5–15.5)
WBC: 6.9 10*3/uL (ref 4.0–10.5)

## 2014-09-09 LAB — BASIC METABOLIC PANEL
Anion gap: 11 (ref 5–15)
BUN: 14 mg/dL (ref 6–23)
CHLORIDE: 93 meq/L — AB (ref 96–112)
CO2: 25 meq/L (ref 19–32)
Calcium: 8.6 mg/dL (ref 8.4–10.5)
Creatinine, Ser: 0.8 mg/dL (ref 0.50–1.35)
GFR calc Af Amer: 86 mL/min — ABNORMAL LOW (ref >90)
GFR calc non Af Amer: 74 mL/min — ABNORMAL LOW (ref >90)
Glucose, Bld: 126 mg/dL — ABNORMAL HIGH (ref 70–99)
Potassium: 4.4 mEq/L (ref 3.7–5.3)
Sodium: 129 mEq/L — ABNORMAL LOW (ref 137–147)

## 2014-09-09 LAB — LACTIC ACID, PLASMA: LACTIC ACID, VENOUS: 1 mmol/L (ref 0.5–2.2)

## 2014-09-09 MED ORDER — AZITHROMYCIN 250 MG PO TABS: 500.0000 mg | ORAL_TABLET | Freq: Every day | ORAL | Status: DC

## 2014-09-09 MED ORDER — ACETAMINOPHEN 325 MG PO TABS
650.0000 mg | ORAL_TABLET | Freq: Once | ORAL | Status: AC
  Administered 2014-09-09: 650 mg via ORAL
  Filled 2014-09-09: qty 2

## 2014-09-09 MED ORDER — CEFUROXIME AXETIL 500 MG PO TABS: 500.0000 mg | ORAL_TABLET | Freq: Two times a day (BID) | ORAL | Status: DC

## 2014-09-09 MED ORDER — CEFUROXIME AXETIL 250 MG PO TABS: 500.0000 mg | ORAL_TABLET | Freq: Two times a day (BID) | ORAL | Status: DC

## 2014-09-09 MED ORDER — AZITHROMYCIN 500 MG PO TABS: 500.0000 mg | ORAL_TABLET | Freq: Every day | ORAL | Status: DC

## 2014-09-09 NOTE — Progress Notes (Signed)
  PROGRESS NOTE  [name] KJZ:[id number] DOB: [birth year] DOA: 09/06/2014 PCP: Philis Fendt, MD  Summary: 89yom presented with n/v. Found to have pneumonia.  Assessment/Plan: 1. CAP with possible sepsis on admission. Clinically resolved. 2. Acute encephalopathy, resolved, secondary to CAP. 3. N/v; no diarrhea. Resolved.  4. Hyponatremia stable, appears to be chronic. 5. Thrombocytopenia, chronic etiology unclear, f/u as outpatient. No heparin. 6. Nonspecific t-wave changes. No CP. troponins negative, no further evaluation. 7. H/o CAD, PVD on Plavix for iliac stent.   Doing well. No hypoxia. Plan home today on oral abx.   [name], MD  [facility]  Pager [phone] If 7PM-7AM, please contact night-coverage at [email or web address], password [facility] 09/09/2014, 10:33 AM  LOS: 3 days   Consultants:  OT: no f/u  PT no f/u  Procedures:    Antibiotics:  Zithromax 10/1 >> 10/5  ceftiaxone 10/1 >> 10/2  Ceftin 10/3 >> 10/8  HPI/Subjective: Feels good, breathing well, no complaints. Ready to go home.  Objective: Filed Vitals:   09/08/14 1446 09/08/14 2300 09/09/14 0300 09/09/14 1002  BP: 130/51 142/45 150/43 137/47  Pulse: 64 64 62 62  Temp: 98.9 F (37.2 C) 99.4 F (37.4 C) 99.3 F (37.4 C)   TempSrc: Oral Oral Oral   Resp: 20 20 19    Height:      Weight:      SpO2: 95% 92% 93%     Intake/Output Summary (Last 24 hours) at 09/09/14 1033 Last data filed at 09/09/14 1020  Gross per 24 hour  Intake    480 ml  Output   1525 ml  Net  -1045 ml     Filed Weights   09/06/14 2350  Weight: 71.215 kg (157 lb)    Exam:     Afebrile, VSS, no hypoxia  appears calm and comfortable  Speech fluent and clear  CV RRR no m/r/g.  Resp CTA bilaterally, no w/r/r. Normal resp effort  Data Reviewed:    Scheduled Meds: . alfuzosin  10 mg Oral Daily  . aspirin EC  81 mg Oral Daily  . atorvastatin  20 mg Oral QPC supper  . azithromycin  500 mg  Intravenous Q24H  . cefTRIAXone (ROCEPHIN)  IV  1 g Intravenous Q24H  . clopidogrel  75 mg Oral QAC breakfast  . finasteride  5 mg Oral Daily  . metoprolol tartrate  12.5 mg Oral BID  . pantoprazole  40 mg Oral Daily  . pregabalin  50 mg Oral QHS   Continuous Infusions:    Principal Problem:   Community acquired pneumonia Active Problems:   Sepsis   Acute encephalopathy   Thrombocytopenia

## 2014-09-09 NOTE — ED Provider Notes (Signed)
CSN: [id number]     Arrival date & time 09/09/14  1702 History  This chart was scribed for Jeremy Relic, MD by Tula Nakayama, ED Scribe. This patient was seen in room APA14/APA14 and the patient's care was started at 5:23 PM.   Chief Complaint  Patient presents with  . Shaking  . Pneumonia   The history is provided by the patient and a relative. No language interpreter was used.   HPI Comments: [name] is a 80 y.o. male who presents to the Emergency Department complaining of fever, shaking and chills. Pt was admitted for pneumonia a few days ago, treated with antibiotics and was discharged from the hospital a few hours ago. Within an hour of being home he developed shaking and chills. Pt has continued cough as an associated symptom. Pt's daughter measured an oral temperature of 99.50F. Pt denies confusion, SOB, vomiting, or falls. Pt lives alone and is afraid to stay alone.   Past Medical History  Diagnosis Date  . Hypertension   . Hyperlipidemia   . Arthritis   . GERD (gastroesophageal reflux disease)   . Anemia   . Hiatal hernia   . Trouble swallowing   . AAA (abdominal aortic aneurysm)   . Macular degeneration   . Peripheral vascular disease   . CAD (coronary artery disease)    Past Surgical History  Procedure Laterality Date  . Tonsillectomy    . Coronary angioplasty with stent placement    . Back surgery  09-05-2011    . Angioplasty / stenting iliac  03-2011    Right Iliac - Gore Stent graft   . Spine surgery     Family History  Problem Relation Age of Onset  . Heart failure Mother   . Diabetes Mother   . Heart disease Mother   . Pneumonia Father   . Cancer Brother   . Diabetes Brother   . Heart disease Brother    History  Substance Use Topics  . Smoking status: Former Smoker    Types: Cigarettes    Quit date: 12/08/1990  . Smokeless tobacco: Current User    Types: Chew  . Alcohol Use: No    Review of Systems 10 Systems reviewed and are negative for  acute change except as noted in the HPI.  Allergies  Hydrocodone and Oxycodone  Home Medications   Prior to Admission medications   Medication Sig Start Date End Date Taking? Authorizing Provider  acetaminophen (TYLENOL) 500 MG tablet Take 1,000 mg by mouth every 6 (six) hours as needed. Pain.   Yes Historical Provider, MD  alfuzosin (UROXATRAL) 10 MG 24 hr tablet Take 10 mg by mouth daily.     Yes Historical Provider, MD  ALPRAZolam (XANAX) 0.25 MG tablet Take 0.25 mg by mouth 2 (two) times daily as needed for sleep or anxiety.    Yes Historical Provider, MD  aspirin EC 81 MG tablet Take 81 mg by mouth daily.     Yes Historical Provider, MD  atorvastatin (LIPITOR) 20 MG tablet Take 20 mg by mouth daily.     Yes Historical Provider, MD  azithromycin (ZITHROMAX) 500 MG tablet Take 1 tablet (500 mg total) by mouth daily. Start evening 10/3. 09/09/14  Yes Samuella Cota, MD  cetirizine (ZYRTEC) 5 MG tablet Take 5 mg by mouth daily as needed for allergies. Allergies.   Yes Historical Provider, MD  clopidogrel (PLAVIX) 75 MG tablet Take 75 mg by mouth daily.  Yes Historical Provider, MD  ferrous sulfate 325 (65 FE) MG tablet Take 325 mg by mouth daily with breakfast.   Yes Historical Provider, MD  lansoprazole (PREVACID) 30 MG capsule Take 30 mg by mouth daily. 10/22/13  Yes Historical Provider, MD  meclizine (ANTIVERT) 50 MG tablet Take 0.5 tablets (25 mg total) by mouth 3 (three) times daily as needed for dizziness or nausea. 08/15/14  Yes Orlie Dakin, MD  metoprolol succinate (TOPROL-XL) 25 MG 24 hr tablet Take 12.5 mg by mouth 2 (two) times daily. 11/21/13  Yes Historical Provider, MD  Multiple Vitamin (MULTIVITAMIN PO) Take 1 tablet by mouth daily.    Yes Historical Provider, MD  Multiple Vitamins-Minerals (ICAPS MV PO) Take 1 capsule by mouth at bedtime.    Yes Historical Provider, MD  Polyethylene Glycol 3350 (MIRALAX PO) Take 17 g by mouth at bedtime. Constipation.   Yes Historical  Provider, MD  pregabalin (LYRICA) 50 MG capsule Take 50 mg by mouth at bedtime.   Yes Historical Provider, MD  Psyllium (METAMUCIL PO) Take 1 scoop by mouth daily.   Yes Historical Provider, MD  traMADol (ULTRAM) 50 MG tablet Take 50 mg by mouth every 6 (six) hours as needed. pain 09/05/14  Yes Historical Provider, MD   BP 165/94  Pulse 72  Temp(Src) 100.6 F (38.1 C) (Oral)  Resp 20  Ht 5\' 10"  (1.778 m)  Wt 156 lb (70.761 kg)  BMI 22.38 kg/m2  SpO2 94% Physical Exam  Nursing note and vitals reviewed. Constitutional:  Awake, alert, nontoxic appearance.  HENT:  Head: Atraumatic.  Eyes: Right eye exhibits no discharge. Left eye exhibits no discharge.  Neck: Neck supple.  Cardiovascular: Normal rate and regular rhythm.   No murmur heard. Pulmonary/Chest: Breath sounds normal. No respiratory distress. He has no wheezes. He exhibits no tenderness.  Normal pulse ox 99% RA  Abdominal: Soft. Bowel sounds are normal. He exhibits no distension. There is no tenderness. There is no rebound.  Musculoskeletal: He exhibits no edema and no tenderness.  Baseline ROM, no obvious new focal weakness.  Neurological:  Mental status and motor strength appears baseline for patient and situation.  Skin: No rash noted.  Psychiatric: He has a normal mood and affect.    ED Course  Procedures (including critical care time) DIAGNOSTIC STUDIES: Oxygen Saturation is 99% on RA, normal by my interpretation.    COORDINATION OF CARE: 5:35 PM Patient informed of clinical course, understand medical decision-making process, and agree with plan. D/w [facility] two different docs during ED Course, both agree no apparent reason to re-admit. 2200 Patient / Family / Caregiver informed of clinical course, understand medical decision-making process, and agree with plan. Labs Review Labs Reviewed  CBC WITH DIFFERENTIAL - Abnormal; Notable for the following:    RBC 3.70 (*)    Hemoglobin 11.5 (*)    HCT 33.4 (*)     Platelets 119 (*)    Neutrophils Relative % 86 (*)    Lymphocytes Relative 6 (*)    Lymphs Abs 0.4 (*)    All other components within normal limits  BASIC METABOLIC PANEL - Abnormal; Notable for the following:    Sodium 129 (*)    Chloride 93 (*)    Glucose, Bld 126 (*)    GFR calc non Af Amer 74 (*)    GFR calc Af Amer 86 (*)    All other components within normal limits  LACTIC ACID, PLASMA    Imaging Review Dg Chest 2  View  09/09/2014   CLINICAL DATA:  The knee with pneumonia 3 days ago and discharge cyst after and present to the ED with cough fever and chills ; history of coronary artery disease and abdominal aortic aneurysm and remote history of tobacco use  EXAM: CHEST  2 VIEW  COMPARISON:  PA and lateral chest of September 06, 2014  FINDINGS: The lungs are mildly hyperinflated. The interstitial infiltrates in the right mid and upper lung have become less conspicuous. There is no infiltrate on the left. There is no pleural effusion. The cardiopericardial silhouette is normal. The pulmonary vascularity is not engorged. There is no pleural effusion. The bony thorax is unremarkable.  IMPRESSION: Persistent infiltrate in the right upper lobe and anterior aspect of the right lower lobe. There has been overall improvement in the appearance of the chest since the previous study. There is underlying COPD.   Electronically Signed   By: David  [place]   On: 09/09/2014 18:47     EKG Interpretation None      MDM   Final diagnoses:  CAP (community acquired pneumonia)   I doubt any other EMC precluding discharge at this time including, but not necessarily limited to the following:sepsis. I personally performed the services described in this documentation, which was scribed in my presence. The recorded information has been reviewed and is accurate.      Jeremy Relic, MD 09/11/14 [phone]

## 2014-09-09 NOTE — Progress Notes (Signed)
Patient's IV was removed and was clean,dry, and intact at removal.  Patient and family received discharge instructions and had no further questions/concerns.  Scripts had previously been called into pharmacy and family stated they had already been picked up.  Patient was in stable condition at discharge.  Patient was escorted to vehicle via wheelchair by nurse tech.

## 2014-09-09 NOTE — Discharge Instructions (Signed)
Medical conditions can worsen, so it is also important to return immediately as directed below, or if you have other serious concerns develop. RETURN IMMEDIATELY IF you develop new shortness of breath, chest pain, fever, have difficulty moving parts of your body (new weakness, numbness, or incoordination), sudden change in speech, vision, swallowing, or understanding, faint or develop new dizziness, severe headache, become poorly responsive or have an altered mental status compared to baseline for you, new rash, abdominal pain, or bloody stools,  Return sooner also if you develop new problems for which you have not talked to your caregiver but you feel may be emergency medical conditions, or are unable to be cared for safely at home.

## 2014-09-09 NOTE — ED Notes (Signed)
Patient with no complaints at this time. Respirations even and unlabored. Skin warm/dry. Discharge instructions reviewed with patient at this time. Patient given opportunity to voice concerns/ask questions. Patient discharged at this time and left Emergency Department with steady gait.   

## 2014-09-09 NOTE — Discharge Summary (Signed)
Physician Discharge Summary  [name] IRW:[id number] DOB: 10/16/1918 DOA: 09/06/2014  PCP: Philis Fendt, MD  Admit date: 09/06/2014 Discharge date: 09/09/2014  Recommendations for Outpatient Follow-up:  1. Resolution of community-acquired pneumonia 2. Suspected chronic hyponatremia 3. Chronic thrombocytopenia  4. [place] RN   Follow-up Information   Follow up with [facility].   Specialty:  Home Health Services   Contact information:   [place] [address] [phone]       Follow up with Nolene Ebbs A, MD. Schedule an appointment as soon as possible for a visit in 1 week.   Specialty:  Internal Medicine   Contact information:   [address] [place] [address] [phone]      Discharge Diagnoses:  1. CAP with sepsis 2. Acute encephalopathy 3. Hyponatremia 4. Thrombocytopenia    Discharge Condition: improved Disposition: home  Diet recommendation: heart healthy  Filed Weights   09/06/14 2350  Weight: 71.215 kg (157 lb)    History of present illness:  89yom presented with n/v. Found to have pneumonia.  Hospital Course:  Treated with empiric abx for CAP and sepsis with rapid resolution of acute issues. Hospitalization was uncomplicated. Individual issues as below.  1. CAP with possible sepsis on admission. Clinically resolved. 2. Acute encephalopathy, resolved, secondary to CAP. 3. N/v; no diarrhea. Resolved.  4. Hyponatremia stable, appears to be chronic. 5. Thrombocytopenia, chronic etiology unclear, f/u as outpatient. No heparin. 6. Nonspecific t-wave changes. No CP. troponins negative, no further evaluation. 7. H/o CAD, PVD on Plavix for iliac stent.  Consultants:  OT: no f/u  PT no f/u Procedures: none Antibiotics:  Zithromax 10/1 >> 10/5  ceftiaxone 10/1 >> 10/2  Ceftin 10/3 >> 10/8  Discharge Instructions  Discharge Instructions   Activity as tolerated - No restrictions    Complete by:  As directed        Diet - low sodium heart healthy    Complete by:  As directed      Discharge instructions    Complete by:  As directed   Call your physician or seek immediate medical assistance for shortness of breath, fever or worsening of condition.          Current Discharge Medication List    START taking these medications   Details  azithromycin (ZITHROMAX) 500 MG tablet Take 1 tablet (500 mg total) by mouth daily. Start evening 10/3. Qty: 3 tablet, Refills: 0    cefUROXime (CEFTIN) 500 MG tablet Take 1 tablet (500 mg total) by mouth 2 (two) times daily with a meal. Start evening 10/3. Qty: 11 tablet, Refills: 0      CONTINUE these medications which have NOT CHANGED   Details  acetaminophen (TYLENOL) 500 MG tablet Take 1,000 mg by mouth every 6 (six) hours as needed. Pain.    alfuzosin (UROXATRAL) 10 MG 24 hr tablet Take 10 mg by mouth daily.      ALPRAZolam (XANAX) 0.25 MG tablet Take 0.25 mg by mouth 2 (two) times daily as needed for sleep or anxiety.     aspirin EC 81 MG tablet Take 81 mg by mouth daily.      atorvastatin (LIPITOR) 20 MG tablet Take 20 mg by mouth daily.      cetirizine (ZYRTEC) 5 MG tablet Take 5 mg by mouth daily as needed for allergies. Allergies.    clopidogrel (PLAVIX) 75 MG tablet Take 75 mg by mouth daily.      ferrous sulfate 325 (65 FE)  MG tablet Take 325 mg by mouth daily with breakfast.    lansoprazole (PREVACID) 30 MG capsule Take 30 mg by mouth daily.    meclizine (ANTIVERT) 50 MG tablet Take 0.5 tablets (25 mg total) by mouth 3 (three) times daily as needed for dizziness or nausea. Qty: 15 tablet, Refills: 0    metoprolol succinate (TOPROL-XL) 25 MG 24 hr tablet Take 12.5 mg by mouth 2 (two) times daily.    Multiple Vitamin (MULTIVITAMIN PO) Take 1 tablet by mouth daily.     Multiple Vitamins-Minerals (ICAPS MV PO) Take 1 capsule by mouth at bedtime.     Polyethylene Glycol 3350 (MIRALAX PO) Take 17 g by mouth at bedtime. Constipation.     pregabalin (LYRICA) 50 MG capsule Take 50 mg by mouth at bedtime.    Psyllium (METAMUCIL PO) Take 1 scoop by mouth daily.    traMADol (ULTRAM) 50 MG tablet Take 50 mg by mouth every 6 (six) hours as needed. pain      STOP taking these medications     finasteride (PROSCAR) 5 MG tablet        Allergies  Allergen Reactions  . Hydrocodone Anaphylaxis and Nausea And Vomiting  . Oxycodone Nausea And Vomiting    The results of significant diagnostics from this hospitalization (including imaging, microbiology, ancillary and laboratory) are listed below for reference.    Significant Diagnostic Studies: Dg Chest 2 View  09/07/2014   CLINICAL DATA:  Generalized weakness, nausea/ vomiting, fever  EXAM: CHEST  2 VIEW  COMPARISON:  08/07/2013  FINDINGS: Multifocal patchy opacities in the right upper and middle lobes, suspicious for pneumonia. Left lung is clear. No pleural effusion or pneumothorax.  The heart is top-normal in size.  Visualized osseous structures are within normal limits.  IMPRESSION: Multifocal patchy opacities in the right upper and middle lobes, suspicious for pneumonia.   Electronically Signed   By: Julian Hy M.D.   On: 09/07/2014 00:26    Microbiology: Recent Results (from the past 240 hour(s))  CULTURE, BLOOD (ROUTINE X 2)     Status: None   Collection Time    09/07/14 12:38 AM      Result Value Ref Range Status   Specimen Description BLOOD RIGHT ANTECUBITAL   Final   Special Requests BOTTLES DRAWN AEROBIC AND ANAEROBIC 8CC EACH   Final   Culture NO GROWTH <24 HRS   Final   Report Status PENDING   Incomplete  CULTURE, BLOOD (ROUTINE X 2)     Status: None   Collection Time    09/07/14 12:38 AM      Result Value Ref Range Status   Specimen Description BLOOD RIGHT FOREARM   Final   Special Requests     Final   Value: BOTTLES DRAWN AEROBIC AND ANAEROBIC AEB [place] ANA 5CC   Culture NO GROWTH <24 HRS   Final   Report Status PENDING   Incomplete  MRSA PCR  SCREENING     Status: None   Collection Time    09/07/14  3:00 AM      Result Value Ref Range Status   MRSA by PCR NEGATIVE  NEGATIVE Final   Comment:            The GeneXpert MRSA Assay (FDA     approved for NASAL specimens     only), is one component of a     comprehensive MRSA colonization     surveillance program. It is not  intended to diagnose MRSA     infection nor to guide or     monitor treatment for     MRSA infections.     Labs: Basic Metabolic Panel:  Recent Labs Lab 09/07/14 0038 09/07/14 0545 09/08/14 0533  NA 128* 132* 132*  K 3.3* 3.5* 4.7  CL 93* 96 97  CO2 25 26 24   GLUCOSE 130* 132* 90  BUN 17 15 16   CREATININE 0.87 0.92 0.94  CALCIUM 8.4 8.3* 8.1*   Liver Function Tests:  Recent Labs Lab 09/07/14 0038  AST 28  ALT 16  ALKPHOS 62  BILITOT 0.7  PROT 6.5  ALBUMIN 3.4*   CBC:  Recent Labs Lab 09/07/14 0038 09/07/14 0545  WBC 5.4 9.9  NEUTROABS 5.0  --   HGB 11.6* 10.7*  HCT 34.0* 31.3*  MCV 89.9 91.0  PLT 123* 106*   Cardiac Enzymes:  Recent Labs Lab 09/07/14 0038 09/07/14 1112  TROPONINI <0.30 <0.30    Principal Problem:   Community acquired pneumonia Active Problems:   Sepsis   Acute encephalopathy   Thrombocytopenia   Time coordinating discharge: 25 minutes  Signed:  [name], MD [facility] 09/09/2014, 11:13 AM

## 2014-09-09 NOTE — ED Notes (Signed)
Rebeca Alert, MD at bedside.

## 2014-09-09 NOTE — ED Notes (Signed)
Discharged from hospital today.  Shaking since 3 pm. States he has a fever.

## 2014-09-10 LAB — LEGIONELLA ANTIGEN, URINE

## 2014-09-11 NOTE — Telephone Encounter (Signed)
Open in error

## 2014-09-12 LAB — CULTURE, BLOOD (ROUTINE X 2)
Culture: NO GROWTH
Culture: NO GROWTH

## 2014-10-17 ENCOUNTER — Other Ambulatory Visit: Payer: Medicare Other

## 2014-10-17 ENCOUNTER — Ambulatory Visit: Payer: Medicare Other | Admitting: Vascular Surgery

## 2014-11-01 ENCOUNTER — Ambulatory Visit (HOSPITAL_COMMUNITY)
Admission: RE | Admit: 2014-11-01 | Discharge: 2014-11-01 | Disposition: A | Discharge status: Home / Self Care | Payer: Medicare Other | Source: Ambulatory Visit | Attending: Internal Medicine | Admitting: Internal Medicine

## 2014-11-01 ENCOUNTER — Other Ambulatory Visit (HOSPITAL_COMMUNITY): Payer: Self-pay | Admitting: Internal Medicine

## 2014-11-01 DIAGNOSIS — Z87891 Personal history of nicotine dependence: Secondary | ICD-10-CM | POA: Insufficient documentation

## 2014-11-01 DIAGNOSIS — R071 Chest pain on breathing: Secondary | ICD-10-CM | POA: Insufficient documentation

## 2014-11-01 DIAGNOSIS — R0789 Other chest pain: Secondary | ICD-10-CM

## 2014-11-01 DIAGNOSIS — J449 Chronic obstructive pulmonary disease, unspecified: Secondary | ICD-10-CM | POA: Insufficient documentation

## 2014-12-08 DIAGNOSIS — C801 Malignant (primary) neoplasm, unspecified: Secondary | ICD-10-CM

## 2014-12-08 HISTORY — DX: Malignant (primary) neoplasm, unspecified: C80.1

## 2014-12-22 ENCOUNTER — Ambulatory Visit (INDEPENDENT_AMBULATORY_CARE_PROVIDER_SITE_OTHER): Payer: Medicare Other | Admitting: Cardiovascular Disease

## 2014-12-22 ENCOUNTER — Encounter: Payer: Self-pay | Admitting: Cardiovascular Disease

## 2014-12-22 VITALS — BP 134/56 | HR 58 | Ht 70.0 in | Wt 157.0 lb

## 2014-12-22 DIAGNOSIS — E7800 Pure hypercholesterolemia, unspecified: Secondary | ICD-10-CM

## 2014-12-22 DIAGNOSIS — I10 Essential (primary) hypertension: Secondary | ICD-10-CM

## 2014-12-22 DIAGNOSIS — I714 Abdominal aortic aneurysm, without rupture, unspecified: Secondary | ICD-10-CM

## 2014-12-22 DIAGNOSIS — I2510 Atherosclerotic heart disease of native coronary artery without angina pectoris: Secondary | ICD-10-CM

## 2014-12-22 DIAGNOSIS — I6523 Occlusion and stenosis of bilateral carotid arteries: Secondary | ICD-10-CM

## 2014-12-22 NOTE — Assessment & Plan Note (Signed)
History of coronary artery disease status post RCA stenting with drug-eluting stent by Dr. Rollene Fare back in 2006 with moderate circumflex disease and normal LV function. He did have a negative Myoview stress test several years ago and denies chest pain.

## 2014-12-22 NOTE — Assessment & Plan Note (Signed)
History of asymptomatic moderate bilateral ICA stenosis left greater than right last checked 12/16/13.

## 2014-12-22 NOTE — Patient Instructions (Signed)
Your physician recommends that you schedule a follow-up appointment in: 6 Months with PA and 12 Months with Dr Berry  

## 2014-12-22 NOTE — Assessment & Plan Note (Signed)
History of abdominal aortic aneurysm status post endoluminal stent grafting by Dr. Sherren Mocha Early in 2011.

## 2014-12-22 NOTE — Assessment & Plan Note (Signed)
History of hypertension with blood pressure measurements at 134/56. He is on metoprolol. Continue current meds at current dosing

## 2014-12-22 NOTE — Assessment & Plan Note (Signed)
History of hyperlipidemia on atorvastatin 20 mg a day. His last lipid profile performed 12/16/13 was 151 total, LDL of 90 and HDL 45. This is followed by his primary care physician as well.

## 2014-12-22 NOTE — Progress Notes (Signed)
12/22/2014 [name]   1918/03/06  [id number]  Primary Physician Philis Fendt, MD Primary Cardiologist: Lorretta Harp MD Renae Gloss   HPI:  [name] is a 81 year old widowed Caucasian male father of one daughter who is accompanying him today. He was formally patient of [name]. I am assumed his care. I last saw him one year ago. He is retired from [employer] as a Teacher, early years/pre. His daughter lives next-door to him. The patient lives independently. He has a history of remote tobacco abuse, hypertension and hyperlipidemia. He has not had a heart attack or stroke. He does have known coronary artery disease status post RCA stenting using a Taxus drug-eluting stent by Dr. Rollene Fare back in 2006 with moderate circumflex disease and normal LV function.  He had anegative Myoview stress test several years ago and denies chest pain or shortness of breath.  He did  have a history of abdominal aortic aneurysm and had endoluminal  stent grafting by Dr. Sherren Mocha Early back in 2011. He also has asymptomatic moderate left internal carotid artery stenosis which we followed by duplex ultrasound   Current Outpatient Prescriptions  Medication Sig Dispense Refill  . acetaminophen (TYLENOL) 500 MG tablet Take 1,000 mg by mouth every 6 (six) hours as needed. Pain.    Marland Kitchen alfuzosin (UROXATRAL) 10 MG 24 hr tablet Take 10 mg by mouth daily.      Marland Kitchen ALPRAZolam (XANAX) 0.25 MG tablet Take 0.25 mg by mouth 2 (two) times daily as needed for sleep or anxiety.     Marland Kitchen aspirin EC 81 MG tablet Take 81 mg by mouth daily.      Marland Kitchen atorvastatin (LIPITOR) 20 MG tablet Take 20 mg by mouth daily.      . cetirizine (ZYRTEC) 5 MG tablet Take 5 mg by mouth daily as needed for allergies. Allergies.    Marland Kitchen clopidogrel (PLAVIX) 75 MG tablet Take 75 mg by mouth daily.      . ferrous sulfate 325 (65 FE) MG tablet Take 325 mg by mouth daily with breakfast.    . lansoprazole (PREVACID) 30 MG capsule Take 30 mg by mouth  daily.    . meclizine (ANTIVERT) 50 MG tablet Take 0.5 tablets (25 mg total) by mouth 3 (three) times daily as needed for dizziness or nausea. 15 tablet 0  . metoprolol succinate (TOPROL-XL) 25 MG 24 hr tablet Take 12.5 mg by mouth 2 (two) times daily.    . Multiple Vitamin (MULTIVITAMIN PO) Take 1 tablet by mouth daily.     . Multiple Vitamins-Minerals (ICAPS MV PO) Take 1 capsule by mouth at bedtime.     . Polyethylene Glycol 3350 (MIRALAX PO) Take 17 g by mouth at bedtime. Constipation.    . pregabalin (LYRICA) 50 MG capsule Take 50 mg by mouth at bedtime.    . Psyllium (METAMUCIL PO) Take 1 scoop by mouth daily.    . traMADol (ULTRAM) 50 MG tablet Take 50 mg by mouth every 6 (six) hours as needed. pain     No current facility-administered medications for this visit.    Allergies  Allergen Reactions  . Hydrocodone Anaphylaxis and Nausea And Vomiting  . Oxycodone Nausea And Vomiting    History   Social History  . Marital Status: Widowed    Spouse Name: N/A    Number of Children: N/A  . Years of Education: N/A   Occupational History  . Not on file.   Social History Main Topics  .  Smoking status: Former Smoker    Types: Cigarettes    Quit date: 12/08/1990  . Smokeless tobacco: Current User    Types: Chew  . Alcohol Use: No  . Drug Use: No  . Sexual Activity: Not on file   Other Topics Concern  . Not on file   Social History Narrative     Review of Systems: General: negative for chills, fever, night sweats or weight changes.  Cardiovascular: negative for chest pain, dyspnea on exertion, edema, orthopnea, palpitations, paroxysmal nocturnal dyspnea or shortness of breath Dermatological: negative for rash Respiratory: negative for cough or wheezing Urologic: negative for hematuria Abdominal: negative for nausea, vomiting, diarrhea, bright red blood per rectum, melena, or hematemesis Neurologic: negative for visual changes, syncope, or dizziness All other systems  reviewed and are otherwise negative except as noted above.    Blood pressure 134/56, pulse 58, height 5\' 10"  (1.778 m), weight 157 lb (71.215 kg).  General appearance: alert and no distress Neck: no adenopathy, no JVD, supple, symmetrical, trachea midline, thyroid not enlarged, symmetric, no tenderness/mass/nodules and soft bilateral carotid bruits Lungs: clear to auscultation bilaterally Heart: regular rate and rhythm, S1, S2 normal, no murmur, click, rub or gallop Extremities: extremities normal, atraumatic, no cyanosis or edema  EKG sinus bradycardia 59 without ST or T-wave changes. I personally reviewed his EKG  ASSESSMENT AND PLAN:   Essential hypertension History of hypertension with blood pressure measurements at 134/56. He is on metoprolol. Continue current meds at current dosing   Coronary atherosclerosis History of coronary artery disease status post RCA stenting with drug-eluting stent by Dr. Rollene Fare back in 2006 with moderate circumflex disease and normal LV function. He did have a negative Myoview stress test several years ago and denies chest pain.   Carotid stenosis History of asymptomatic moderate bilateral ICA stenosis left greater than right last checked 12/16/13.   Abdominal aortic aneurysm History of abdominal aortic aneurysm status post endoluminal stent grafting by Dr. Sherren Mocha Early in 2011.   HYPERCHOLESTEROLEMIA History of hyperlipidemia on atorvastatin 20 mg a day. His last lipid profile performed 12/16/13 was 151 total, LDL of 90 and HDL 45. This is followed by his primary care physician as well.       Lorretta Harp MD FACP,FACC,FAHA, [facility] 12/22/2014 11:07 AM

## 2014-12-27 NOTE — Addendum Note (Signed)
Addended by: Vear Clock on: 12/27/2014 12:12 PM   Modules accepted: Orders

## 2015-02-22 ENCOUNTER — Telehealth: Payer: Self-pay | Admitting: *Deleted

## 2015-02-22 NOTE — Telephone Encounter (Signed)
Dr Ernestina Patches is planning to do a spinal procedure and is requesting that Mr Packett hold his Plavix 7 days prior to the procedure.   I will defer to Dr Gwenlyn Found

## 2015-02-23 NOTE — Telephone Encounter (Signed)
Okay to hold his Plavix for 7 days prior to his spinal procedure

## 2015-02-26 NOTE — Telephone Encounter (Signed)
Will route message to Dr Ernestina Patches.

## 2015-05-17 ENCOUNTER — Telehealth: Payer: Self-pay | Admitting: Cardiovascular Disease

## 2015-05-17 NOTE — Telephone Encounter (Signed)
Message routed to Dr. Cecil Cobbs, RN to review and advise   Of note, in March 2016 - Dr. Gwenlyn Found gave OK to hold plavix for 7 days for spinal procedure

## 2015-05-17 NOTE — Telephone Encounter (Signed)
Pt's daughter called in stating that the pt needs to have a tooth extracted and since the pt is currently on plavix she wanted to know #1 if it is safe for him to come off of the plavix and # 2 if he is able to come off the plavix when should he stop taking it in order to have the procedure. Please call  Thanks

## 2015-05-18 NOTE — Telephone Encounter (Signed)
He has had safe if these are anything clear to have it stopped in the past. Hold for 7 days prior

## 2015-05-18 NOTE — Telephone Encounter (Signed)
Notified patient's daughter to hold plavix 7 days prior to procedure - restart after

## 2015-05-27 ENCOUNTER — Encounter (HOSPITAL_COMMUNITY): Payer: Self-pay

## 2015-05-27 ENCOUNTER — Emergency Department (HOSPITAL_COMMUNITY)
Admission: EM | Admit: 2015-05-27 | Discharge: 2015-05-27 | Disposition: A | Discharge status: Home / Self Care | Payer: Medicare Other | Attending: Emergency Medicine | Admitting: Emergency Medicine

## 2015-05-27 DIAGNOSIS — Z7982 Long term (current) use of aspirin: Secondary | ICD-10-CM | POA: Insufficient documentation

## 2015-05-27 DIAGNOSIS — Z8669 Personal history of other diseases of the nervous system and sense organs: Secondary | ICD-10-CM | POA: Insufficient documentation

## 2015-05-27 DIAGNOSIS — Z79899 Other long term (current) drug therapy: Secondary | ICD-10-CM | POA: Insufficient documentation

## 2015-05-27 DIAGNOSIS — E785 Hyperlipidemia, unspecified: Secondary | ICD-10-CM | POA: Diagnosis not present

## 2015-05-27 DIAGNOSIS — Z9861 Coronary angioplasty status: Secondary | ICD-10-CM | POA: Diagnosis not present

## 2015-05-27 DIAGNOSIS — R339 Retention of urine, unspecified: Secondary | ICD-10-CM

## 2015-05-27 DIAGNOSIS — Z87891 Personal history of nicotine dependence: Secondary | ICD-10-CM | POA: Insufficient documentation

## 2015-05-27 DIAGNOSIS — Z7902 Long term (current) use of antithrombotics/antiplatelets: Secondary | ICD-10-CM | POA: Insufficient documentation

## 2015-05-27 DIAGNOSIS — I10 Essential (primary) hypertension: Secondary | ICD-10-CM | POA: Diagnosis not present

## 2015-05-27 DIAGNOSIS — K219 Gastro-esophageal reflux disease without esophagitis: Secondary | ICD-10-CM | POA: Diagnosis not present

## 2015-05-27 DIAGNOSIS — I2510 Atherosclerotic heart disease of native coronary artery without angina pectoris: Secondary | ICD-10-CM | POA: Insufficient documentation

## 2015-05-27 DIAGNOSIS — D649 Anemia, unspecified: Secondary | ICD-10-CM | POA: Insufficient documentation

## 2015-05-27 DIAGNOSIS — M1990 Unspecified osteoarthritis, unspecified site: Secondary | ICD-10-CM | POA: Diagnosis not present

## 2015-05-27 LAB — BASIC METABOLIC PANEL
ANION GAP: 8 (ref 5–15)
BUN: 16 mg/dL (ref 6–20)
CALCIUM: 8.8 mg/dL — AB (ref 8.9–10.3)
CO2: 28 mmol/L (ref 22–32)
CREATININE: 0.74 mg/dL (ref 0.61–1.24)
Chloride: 92 mmol/L — ABNORMAL LOW (ref 101–111)
GFR calc Af Amer: >60 mL/min (ref >60)
GLUCOSE: 109 mg/dL — AB (ref 65–99)
Potassium: 4.1 mmol/L (ref 3.5–5.1)
Sodium: 128 mmol/L — ABNORMAL LOW (ref 135–145)

## 2015-05-27 LAB — URINALYSIS, ROUTINE W REFLEX MICROSCOPIC
Bilirubin Urine: NEGATIVE
Glucose, UA: NEGATIVE mg/dL
Hgb urine dipstick: NEGATIVE
Leukocytes, UA: NEGATIVE
Nitrite: NEGATIVE
PROTEIN: NEGATIVE mg/dL
Specific Gravity, Urine: 1.015 (ref 1.005–1.030)
Urobilinogen, UA: 0.2 mg/dL (ref 0.0–1.0)
pH: 6 (ref 5.0–8.0)

## 2015-05-27 NOTE — ED Notes (Signed)
Pt sitting on side of bed attempting urine collection.

## 2015-05-27 NOTE — Discharge Instructions (Signed)
Acute Urinary Retention Acute urinary retention is when you are unable to pee (urinate). Acute urinary retention is common in older men. Prostates can get bigger, which blocks the flow of pee.  HOME CARE  Drink enough fluids to keep your pee clear or pale yellow.  If you are sent home with a tube that drains the bladder (catheter), there will be a drainage bag attached to it. There are two types of bags. One is big that you can wear at night without having to empty it. One is smaller and needs to be emptied more often.  Keep the drainage bag empty.  Keep the drainage bag lower than your catheter.  Only take medicine as told by your doctor. GET HELP IF:  You have a low-grade fever.  You have spasms or you are leaking pee when you have spasms. GET HELP RIGHT AWAY IF:   You have chills or a fever.  Your catheter stops draining pee.  Your catheter falls out.  You have increased bleeding that does not stop after you have rested and increased the amount of fluids you had been drinking. MAKE SURE YOU:   Understand these instructions.  Will watch your condition.  Will get help right away if you are not doing well or get worse. Document Released: 05/12/2008 Document Revised: 09/14/2013 Document Reviewed: 05/05/2013 [facility] Patient Information 2015 [place], [place]. This information is not intended to replace advice given to you by your health care provider. Make sure you discuss any questions you have with your health care provider.  Recommend following up with urologist. Return if gets to the point we are unable to urinate. Urinalysis negative for urinary tract infection. Sodium is still low this is been a chronic problem for you follow-up with your regular doctor next week for recheck.

## 2015-05-27 NOTE — ED Notes (Signed)
MD at bedside. 

## 2015-05-27 NOTE — ED Provider Notes (Addendum)
CSN: [id number]     Arrival date & time 05/27/15  0806 History  This chart was scribed for Fredia Sorrow, MD by Marlowe Kays, ED Scribe. This patient was seen in room APA03/APA03 and the patient's care was started at 8:17 AM.  Chief Complaint  Patient presents with  . Urinary Retention   The history is provided by the patient and medical records. No language interpreter was used.    HPI Comments:  [name] is a 81 y.o. male who presents to the Emergency Department complaining of difficulty urinating. Patient brought in by his daughter. Patient states he had to strain hard to urinate for the past 2 days. He was able to urinate) or to arrival to the emergency department. Patient's daughter states that he does have a history of urinary tract infections. He is followed by urology in [place]. Patient denies any fevers no nausea or vomiting.  Past Medical History  Diagnosis Date  . Hypertension   . Hyperlipidemia   . Arthritis   . GERD (gastroesophageal reflux disease)   . Anemia   . Hiatal hernia   . Trouble swallowing   . AAA (abdominal aortic aneurysm)   . Macular degeneration   . Peripheral vascular disease   . CAD (coronary artery disease)   . Arthritis   . Anemia    Past Surgical History  Procedure Laterality Date  . Tonsillectomy    . Coronary angioplasty with stent placement    . Back surgery  09-05-2011    . Angioplasty / stenting iliac  03-2011    Right Iliac - Gore Stent graft   . Spine surgery     Family History  Problem Relation Age of Onset  . Heart failure Mother   . Diabetes Mother   . Heart disease Mother   . Pneumonia Father   . Cancer Brother   . Diabetes Brother   . Heart disease Brother    History  Substance Use Topics  . Smoking status: Former Smoker    Types: Cigarettes    Quit date: 12/08/1990  . Smokeless tobacco: Current User    Types: Chew  . Alcohol Use: No    Review of Systems  Constitutional: Negative for fever and chills.   HENT: Negative for rhinorrhea and sore throat.   Eyes: Negative for visual disturbance.  Respiratory: Negative for cough and shortness of breath.   Cardiovascular: Negative for chest pain and leg swelling.  Gastrointestinal: Negative for nausea, vomiting, abdominal pain and diarrhea.  Genitourinary: Positive for decreased urine volume and difficulty urinating. Negative for dysuria and urgency.  Musculoskeletal: Negative for back pain and neck pain.  Skin: Negative for rash.  Neurological: Negative for dizziness, light-headedness and headaches.  Hematological: Does not bruise/bleed easily.  Psychiatric/Behavioral: Negative for confusion.    Allergies  Hydrocodone and Oxycodone  Home Medications   Prior to Admission medications   Medication Sig Start Date End Date Taking? Authorizing Provider  acetaminophen (TYLENOL) 500 MG tablet Take 1,000 mg by mouth every 6 (six) hours as needed. Pain.    Historical Provider, MD  alfuzosin (UROXATRAL) 10 MG 24 hr tablet Take 10 mg by mouth daily.      Historical Provider, MD  ALPRAZolam Duanne Moron) 0.25 MG tablet Take 0.25 mg by mouth 2 (two) times daily as needed for sleep or anxiety.     Historical Provider, MD  aspirin EC 81 MG tablet Take 81 mg by mouth daily.      Historical Provider, MD  atorvastatin (LIPITOR) 20 MG tablet Take 20 mg by mouth daily.      Historical Provider, MD  cetirizine (ZYRTEC) 5 MG tablet Take 5 mg by mouth daily as needed for allergies. Allergies.    Historical Provider, MD  clopidogrel (PLAVIX) 75 MG tablet Take 75 mg by mouth daily.      Historical Provider, MD  ferrous sulfate 325 (65 FE) MG tablet Take 325 mg by mouth daily with breakfast.    Historical Provider, MD  lansoprazole (PREVACID) 30 MG capsule Take 30 mg by mouth daily. 10/22/13   Historical Provider, MD  meclizine (ANTIVERT) 50 MG tablet Take 0.5 tablets (25 mg total) by mouth 3 (three) times daily as needed for dizziness or nausea. 08/15/14   Orlie Dakin, MD   metoprolol succinate (TOPROL-XL) 25 MG 24 hr tablet Take 12.5 mg by mouth 2 (two) times daily. 11/21/13   Historical Provider, MD  Multiple Vitamin (MULTIVITAMIN PO) Take 1 tablet by mouth daily.     Historical Provider, MD  Multiple Vitamins-Minerals (ICAPS MV PO) Take 1 capsule by mouth at bedtime.     Historical Provider, MD  Polyethylene Glycol 3350 (MIRALAX PO) Take 17 g by mouth at bedtime. Constipation.    Historical Provider, MD  pregabalin (LYRICA) 50 MG capsule Take 50 mg by mouth at bedtime.    Historical Provider, MD  Psyllium (METAMUCIL PO) Take 1 scoop by mouth daily.    Historical Provider, MD  traMADol (ULTRAM) 50 MG tablet Take 50 mg by mouth every 6 (six) hours as needed. pain 09/05/14   Historical Provider, MD   There were no vitals taken for this visit. Physical Exam  Constitutional: He is oriented to person, place, and time. He appears well-developed and well-nourished. No distress.  HENT:  Head: Normocephalic and atraumatic.  Mouth/Throat: Oropharynx is clear and moist.  Eyes: Conjunctivae and EOM are normal. Pupils are equal, round, and reactive to light.  Neck: Normal range of motion.  Cardiovascular: Normal rate, regular rhythm and normal heart sounds.   No murmur heard. Pulmonary/Chest: Effort normal and breath sounds normal. No respiratory distress.  Abdominal: Soft. Bowel sounds are normal. He exhibits no mass. There is no tenderness.  Musculoskeletal: Normal range of motion. He exhibits no edema.  Neurological: He is alert and oriented to person, place, and time. No cranial nerve deficit. He exhibits normal muscle tone. Coordination normal.  Patient hard of hearing.  Skin: Skin is warm.  Nursing note and vitals reviewed.   ED Course  Procedures (including critical care time) DIAGNOSTIC STUDIES: Oxygen Saturation is 97% on RA, normal by my interpretation.   COORDINATION OF CARE: 8:17 AM- Pt verbalizes understanding and agrees to plan.  Medications - No  data to display  Labs Review Labs Reviewed - No data to display Results for orders placed or performed during the hospital encounter of 05/27/15  Urinalysis, Routine w reflex microscopic (not at [facility])  Result Value Ref Range   Color, Urine YELLOW YELLOW   APPearance CLEAR CLEAR   Specific Gravity, Urine 1.015 1.005 - 1.030   pH 6.0 5.0 - 8.0   Glucose, UA NEGATIVE NEGATIVE mg/dL   Hgb urine dipstick NEGATIVE NEGATIVE   Bilirubin Urine NEGATIVE NEGATIVE   Ketones, ur TRACE (A) NEGATIVE mg/dL   Protein, ur NEGATIVE NEGATIVE mg/dL   Urobilinogen, UA 0.2 0.0 - 1.0 mg/dL   Nitrite NEGATIVE NEGATIVE   Leukocytes, UA NEGATIVE NEGATIVE  Basic metabolic panel  Result Value Ref Range  Sodium 128 (L) 135 - 145 mmol/L   Potassium 4.1 3.5 - 5.1 mmol/L   Chloride 92 (L) 101 - 111 mmol/L   CO2 28 22 - 32 mmol/L   Glucose, Bld 109 (H) 65 - 99 mg/dL   BUN 16 6 - 20 mg/dL   Creatinine, Ser 0.74 0.61 - 1.24 mg/dL   Calcium 8.8 (L) 8.9 - 10.3 mg/dL   GFR calc non Af Amer >60 >60 mL/min   GFR calc Af Amer >60 >60 mL/min   Anion gap 8 5 - 15    Imaging Review No results found.   EKG Interpretation None      MDM   Final diagnoses:  None   Bladder scan showed only 60 mL of urine remaining in the bladder. Will have patient try to void and check a urine sample. Also will be checking patient's renal function. Based on bladder scan no evidence of any significant retention.  Patient's urinalysis is negative for urinary tract infection. Patient's renal function is normal. Patient does have low sodium that is been a common problem for him. Patient not with any significant symptoms. We'll have him follow-up with his regular doctor. Also follow-up with his urologist. Patient seems to be stable for discharge home.  I personally performed the services described in this documentation, which was scribed in my presence. The recorded information has been reviewed and is accurate.    Fredia Sorrow,  MD 05/27/15 5809  Fredia Sorrow, MD 05/27/15 1005

## 2015-05-27 NOTE — ED Notes (Signed)
Pt complain of a lot of pressure in his bladder

## 2015-05-30 ENCOUNTER — Ambulatory Visit: Payer: Medicare Other | Admitting: Nurse Practitioner

## 2015-07-03 ENCOUNTER — Ambulatory Visit (INDEPENDENT_AMBULATORY_CARE_PROVIDER_SITE_OTHER): Payer: Medicare Other | Admitting: Cardiology

## 2015-07-03 ENCOUNTER — Encounter: Payer: Self-pay | Admitting: Cardiology

## 2015-07-03 VITALS — BP 130/58 | HR 58 | Ht 70.0 in | Wt 147.6 lb

## 2015-07-03 DIAGNOSIS — I2510 Atherosclerotic heart disease of native coronary artery without angina pectoris: Secondary | ICD-10-CM | POA: Diagnosis not present

## 2015-07-03 DIAGNOSIS — I714 Abdominal aortic aneurysm, without rupture, unspecified: Secondary | ICD-10-CM

## 2015-07-03 DIAGNOSIS — I10 Essential (primary) hypertension: Secondary | ICD-10-CM | POA: Diagnosis not present

## 2015-07-03 DIAGNOSIS — I6523 Occlusion and stenosis of bilateral carotid arteries: Secondary | ICD-10-CM | POA: Diagnosis not present

## 2015-07-03 DIAGNOSIS — Z9861 Coronary angioplasty status: Secondary | ICD-10-CM | POA: Diagnosis not present

## 2015-07-03 NOTE — Assessment & Plan Note (Signed)
Controlled, echo 2014- EF 60-65% with moderate LVF and AOV sclerosis

## 2015-07-03 NOTE — Progress Notes (Signed)
07/03/2015 [name]   12/17/1917  [id number]  Primary Physician Jeremy Fendt, MD Primary Cardiologist: Dr [name]  HPI:  81 y/o male followed by Dr [name] with a history of a remote RCA stent. Last Myoview was low risk in 2012. He has normal LVF and has not had heart failure or angina. He lives in hi own home but his son checks on him during the day.    Current Outpatient Prescriptions  Medication Sig Dispense Refill  . acetaminophen (TYLENOL) 500 MG tablet Take 1,000 mg by mouth every 6 (six) hours as needed. Pain.    Marland Kitchen alfuzosin (UROXATRAL) 10 MG 24 hr tablet Take 10 mg by mouth daily.      Marland Kitchen ALPRAZolam (XANAX) 0.25 MG tablet Take 0.25 mg by mouth 2 (two) times daily as needed for sleep or anxiety.     Marland Kitchen aspirin EC 81 MG tablet Take 81 mg by mouth daily.      Marland Kitchen atorvastatin (LIPITOR) 20 MG tablet Take 20 mg by mouth daily.      . cetirizine (ZYRTEC) 5 MG tablet Take 5 mg by mouth daily as needed for allergies. Allergies.    Marland Kitchen clopidogrel (PLAVIX) 75 MG tablet Take 75 mg by mouth daily.      Marland Kitchen dutasteride (AVODART) 0.5 MG capsule Take 0.5 mg by mouth daily.    . ferrous sulfate 325 (65 FE) MG tablet Take 325 mg by mouth daily with breakfast.    . lansoprazole (PREVACID) 30 MG capsule Take 30 mg by mouth daily.    . meclizine (ANTIVERT) 50 MG tablet Take 0.5 tablets (25 mg total) by mouth 3 (three) times daily as needed for dizziness or nausea. 15 tablet 0  . metoprolol succinate (TOPROL-XL) 25 MG 24 hr tablet Take 12.5 mg by mouth 2 (two) times daily.    . Multiple Vitamin (MULTIVITAMIN PO) Take 1 tablet by mouth daily.     . Multiple Vitamins-Minerals (ICAPS MV PO) Take 1 capsule by mouth at bedtime.     . penicillin v potassium (VEETID) 500 MG tablet Take 1 tablet by mouth 4 (four) times daily.  0  . Polyethylene Glycol 3350 (MIRALAX PO) Take 17 g by mouth at bedtime. Constipation.    . pregabalin (LYRICA) 50 MG capsule Take 50 mg by mouth at bedtime.    . Psyllium (METAMUCIL  PO) Take 1 scoop by mouth daily.    . traMADol (ULTRAM) 50 MG tablet Take 50 mg by mouth every 8 (eight) hours.     No current facility-administered medications for this visit.    Allergies  Allergen Reactions  . Hydrocodone Anaphylaxis and Nausea And Vomiting  . Oxycodone Nausea And Vomiting    History   Social History  . Marital Status: Widowed    Spouse Name: N/A  . Number of Children: N/A  . Years of Education: N/A   Occupational History  . Not on file.   Social History Main Topics  . Smoking status: Former Smoker    Types: Cigarettes    Quit date: 12/08/1990  . Smokeless tobacco: Current User    Types: Chew  . Alcohol Use: No  . Drug Use: No  . Sexual Activity: Not on file   Other Topics Concern  . Not on file   Social History Narrative     Review of Systems: General: negative for chills, fever, night sweats or weight changes.  Cardiovascular: negative for chest pain, dyspnea on exertion, edema, orthopnea, palpitations,  paroxysmal nocturnal dyspnea or shortness of breath Dermatological: negative for rash Respiratory: negative for cough or wheezing Urologic: negative for hematuria He has had BPH with urinary problems followed by Dr [name]. His BPH medications were changed and he has had trouble affording the new ones. He was seen in the ED in June but cleared and sent home. Abdominal: negative for nausea, vomiting, diarrhea, bright red blood per rectum, melena, or hematemesis Neurologic: negative for visual changes, syncope, or dizziness All other systems reviewed and are otherwise negative except as noted above.    Blood pressure 130/58, pulse 58, height 5\' 10"  (1.778 m), weight 147 lb 9.6 oz (66.951 kg).  General appearance: alert, cooperative, no distress and HOH Neck: no carotid bruit and no JVD Lungs: clear to auscultation bilaterally Heart: regular rate and rhythm and soft systolic murmur AOV Abdomen: soft, non-tender; bowel sounds normal; no masses,   no organomegaly Extremities: extremities normal, atraumatic, no cyanosis or edema Skin: Skin color, texture, turgor normal. No rashes or lesions Neurologic: Grossly normal  EKG NSR-[id number]. No acute changes  ASSESSMENT AND PLAN:   Abdominal aortic aneurysm Hx of AO stent graft  CAD S/P RCA stent '06 Low risk Nuc 2012. No angina  Carotid stenosis Moderate, asymptomatic  Essential hypertension Controlled, echo 2014- EF 60-65% with moderate LVF and AOV sclerosis   PLAN  Same cardiac Rx, f/u with Dr [name] in 6 months. Last carotid dopplers done Jan 2015- will defer to Dr [name] as to weather or not he needs a repeat.   [name] K PA-C 07/03/2015 12:50 PM

## 2015-07-03 NOTE — Assessment & Plan Note (Signed)
Hx of AO stent graft

## 2015-07-03 NOTE — Assessment & Plan Note (Signed)
Low risk Nuc 2012. No angina

## 2015-07-03 NOTE — Assessment & Plan Note (Signed)
Moderate, asymptomatic

## 2015-07-03 NOTE — Patient Instructions (Signed)
Your physician wants you to follow-up in: 6 Months with Dr Berry. You will receive a reminder letter in the mail two months in advance. If you don't receive a letter, please call our office to schedule the follow-up appointment.    

## 2015-07-26 ENCOUNTER — Other Ambulatory Visit: Payer: Self-pay | Admitting: Vascular Surgery

## 2015-07-26 LAB — CREATININE, SERUM: Creat: 0.94 mg/dL (ref 0.70–1.11)

## 2015-07-26 LAB — BUN: BUN: 22 mg/dL (ref 7–25)

## 2015-07-30 ENCOUNTER — Encounter: Payer: Self-pay | Admitting: Family

## 2015-07-31 ENCOUNTER — Ambulatory Visit (INDEPENDENT_AMBULATORY_CARE_PROVIDER_SITE_OTHER): Payer: Medicare Other | Admitting: Family

## 2015-07-31 ENCOUNTER — Encounter: Payer: Self-pay | Admitting: Family

## 2015-07-31 ENCOUNTER — Ambulatory Visit
Admission: RE | Admit: 2015-07-31 | Discharge: 2015-07-31 | Disposition: A | Discharge status: Home / Self Care | Payer: Medicare Other | Source: Ambulatory Visit | Attending: Family | Admitting: Family

## 2015-07-31 VITALS — BP 139/59 | HR 69 | Temp 98.4°F | Resp 18 | Ht 69.0 in | Wt 148.9 lb

## 2015-07-31 DIAGNOSIS — Z9889 Other specified postprocedural states: Secondary | ICD-10-CM

## 2015-07-31 DIAGNOSIS — Z95828 Presence of other vascular implants and grafts: Secondary | ICD-10-CM

## 2015-07-31 DIAGNOSIS — I714 Abdominal aortic aneurysm, without rupture, unspecified: Secondary | ICD-10-CM

## 2015-07-31 DIAGNOSIS — I6523 Occlusion and stenosis of bilateral carotid arteries: Secondary | ICD-10-CM

## 2015-07-31 DIAGNOSIS — I723 Aneurysm of iliac artery: Secondary | ICD-10-CM | POA: Diagnosis not present

## 2015-07-31 MED ORDER — IOPAMIDOL (ISOVUE-370) INJECTION 76%
75.0000 mL | Freq: Once | INTRAVENOUS | Status: AC | PRN
  Administered 2015-07-31: 75 mL via INTRAVENOUS

## 2015-07-31 NOTE — Progress Notes (Signed)
[facility] [place]  Established EVAR/ iliac stent placement/CTA follow up  History of Present Illness  [name] is a 81 y.o. (12-Apr-1918) male patient of Dr. Donnetta Hutching who is s/p Angioplasty / stenting iliac/Right Iliac - Gore Stent graft 03-2011.  He returns today for discussion of his every 2 years CTA abdomen/pelvis to evaluate his EVAR as noted by Dr. Donnetta Hutching in his 10/12/12 progress note. Dr. Gwenlyn Found has been monitoring his carotid arteries. The patient has not had abdominal pain. He does have chronic back pain, now new back pain. Pt denies any history of stroke or TIA.   Pt Diabetic: No Pt smoker: non-smoker, but chews a great deal of tobacco all his life until he stopped this about February 2016.   Past Medical History  Diagnosis Date  . Hypertension   . Hyperlipidemia   . Arthritis   . GERD (gastroesophageal reflux disease)   . Anemia   . Hiatal hernia   . Trouble swallowing   . AAA (abdominal aortic aneurysm)   . Macular degeneration   . Peripheral vascular disease   . CAD (coronary artery disease)   . Arthritis   . Anemia    Past Surgical History  Procedure Laterality Date  . Tonsillectomy    . Coronary angioplasty with stent placement    . Back surgery  09-05-2011    . Angioplasty / stenting iliac  03-2011    Right Iliac - Gore Stent graft   . Spine surgery     Social History Social History  Substance Use Topics  . Smoking status: Former Smoker    Types: Cigarettes    Quit date: 12/08/1990  . Smokeless tobacco: Current User    Types: Chew  . Alcohol Use: No   Family History Family History  Problem Relation Age of Onset  . Heart failure Mother   . Diabetes Mother   . Heart disease Mother   . Pneumonia Father   . Cancer Brother   . Diabetes Brother   . Heart disease Brother    Current Outpatient Prescriptions on File Prior to Visit  Medication Sig Dispense Refill  . acetaminophen (TYLENOL) 500 MG tablet Take 1,000 mg by mouth  every 6 (six) hours as needed. Pain.    Marland Kitchen alfuzosin (UROXATRAL) 10 MG 24 hr tablet Take 10 mg by mouth daily.      Marland Kitchen ALPRAZolam (XANAX) 0.25 MG tablet Take 0.25 mg by mouth 2 (two) times daily as needed for sleep or anxiety.     Marland Kitchen aspirin EC 81 MG tablet Take 81 mg by mouth daily.      Marland Kitchen atorvastatin (LIPITOR) 20 MG tablet Take 20 mg by mouth daily.      . cetirizine (ZYRTEC) 5 MG tablet Take 5 mg by mouth daily as needed for allergies. Allergies.    Marland Kitchen clopidogrel (PLAVIX) 75 MG tablet Take 75 mg by mouth daily.      Marland Kitchen dutasteride (AVODART) 0.5 MG capsule Take 0.5 mg by mouth daily.    . ferrous sulfate 325 (65 FE) MG tablet Take 325 mg by mouth daily with breakfast.    . lansoprazole (PREVACID) 30 MG capsule Take 30 mg by mouth daily.    . meclizine (ANTIVERT) 50 MG tablet Take 0.5 tablets (25 mg total) by mouth 3 (three) times daily as needed for dizziness or nausea. 15 tablet 0  . metoprolol succinate (TOPROL-XL) 25 MG 24 hr tablet Take 12.5 mg by mouth 2 (two) times  daily.    . Multiple Vitamin (MULTIVITAMIN PO) Take 1 tablet by mouth daily.     . Multiple Vitamins-Minerals (ICAPS MV PO) Take 1 capsule by mouth at bedtime.     . penicillin v potassium (VEETID) 500 MG tablet Take 1 tablet by mouth 4 (four) times daily.  0  . Polyethylene Glycol 3350 (MIRALAX PO) Take 17 g by mouth at bedtime. Constipation.    . pregabalin (LYRICA) 50 MG capsule Take 50 mg by mouth at bedtime.    . Psyllium (METAMUCIL PO) Take 1 scoop by mouth daily.    . traMADol (ULTRAM) 50 MG tablet Take 50 mg by mouth every 8 (eight) hours.     No current facility-administered medications on file prior to visit.   Allergies  Allergen Reactions  . Hydrocodone Anaphylaxis and Nausea And Vomiting  . Oxycodone Nausea And Vomiting     ROS: See HPI for pertinent positives and negatives.  Physical Examination  Filed Vitals:   07/31/15 1232 07/31/15 1237  BP: 159/65 139/59  Pulse: 69   Temp: 98.4 F (36.9 C)    TempSrc: Oral   Resp: 18   Height: 5\' 9"  (1.753 m)   Weight: 148 lb 14.4 oz (67.541 kg)   SpO2: 98%    Body mass index is 21.98 kg/(m^2).   General: A&O x 3, WD, walking with rolling walker, steady  Pulmonary: Sym exp, good air movt, CTAB, no rales, rhonchi, or wheezing  Cardiac: RRR, Nl S1, S2, no murmurs appreciated  Vascular: Vessel Right Left  Radial 2+Palpable 2+Palpable  Carotid without bruit without bruit  Aorta not palpable N/A  Femoral 3+Palpable 3+Palpable  Popliteal 1+ palpable 1+ palpable  PT Not Palpable Not Palpable  DP 1+Palpable 1+Palpable   Gastrointestinal: soft, NTND, -G/R, - HSM, - palpable masses, - CVAT B.  Musculoskeletal: M/S 4/5 throughout, Extremities without ischemic changes.  Neurologic: Pain and light touch intact in extremities, Motor exam as listed above. He is very hard of hearing with hearing aids in place.    CTA Abd/Pelvis Duplex (Date: 08/07/13)  An abdominal aorta endograft is present, extending from below the  level of the renal arteries inferiorly, and terminating in the  external iliac artery on the right and the distal common iliac  artery on the left. When compared directly to the CTA of  10/12/2012, the maximal sac diameter distally is stable measuring  approximately 3.4 by 3.6 cm. The diameter of the thrombosed right  common iliac artery aneurysm is stable, measuring approximately 3.7  cm greatest diameter. The endograft appears patent.  Atherosclerotic calcification of the visceral branch vessels is  noted and appears similar to the CTA study performed 10/12/2012.  There is stable atherosclerotic disease involving both common femoral arteries.         CTA Abd/Pelvis Duplex (Date: 07/31/15):  FINDINGS: Since prior imaging, the bifurcated aortic endograft shows stable positioning and normal patency. Thrombosed aneurysm sac surrounding the endograft is stable in size with maximum  dimensions of approximately 3.2 x 3.6 cm. There is no evidence of endoleak by CTA.  Thrombosed right common iliac artery aneurysm again noted surrounding endograft limb extension. This aneurysm shows decrease in size from 3.7 cm on the prior CTA to 3.1 cm. There is stable occlusion of the right internal iliac artery by coils. Native external iliac artery and common femoral artery show stable patency with stable calcified plaque in the common femoral artery and at the right SFA origin causing moderate stenosis. The  left iliac limb shows stable patency and positioning, terminating in the distal left common iliac artery. Native external and common femoral arteries show stable patency on the left.  Visceral arteries show stable atherosclerosis with approximately 50- 60% stenosis at the origin of the celiac axis, 50- 70% stenosis in the proximal superior mesenteric artery and mild stenoses of bilateral renal arteries.  IMPRESSION: 1. Stable patency and positioning of aortic endograft. The aneurysm sac size is stable measuring 3.6 cm in greatest diameter. There is no evidence of endoleak by CTA. 2. Stable exclusion of right common iliac artery aneurysm with decrease in thrombosed aneurysm sac size to 3.1 cm (previously 3.7 cm). There is stable occlusion of the right internal iliac artery trunk by coils. 3. Stable moderate disease involving the proximal celiac axis, proximal SMA, right common femoral artery and right SFA origin.    Medical Decision Making  [name] is a 81 y.o. male who who is s/p Angioplasty / stenting iliac/Right Iliac - Gore Stent graft 03-2011. He returns today for discussion of his every 2 years CTA abdomen/pelvis to evaluate his EVAR as noted by Dr. Donnetta Hutching in his 10/12/12 progress note. Dr. Gwenlyn Found has been monitoring his carotid arteries. The patient has not had abdominal pain. He does have chronic back pain with known lumbar spine issues, no new back  pain.  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 There is stable occlusion of the right internal iliac artery trunk by coils.   Pt is asymptomatic with decreased sac size.  I discussed with the patient the importance of surveillance of the endograft.  The next CTA will be scheduled for 24 months.  The patient will follow up with [place] in 24 months with these studies.  I emphasized the importance of maximal medical management including strict control of blood pressure, blood glucose, and lipid levels, antiplatelet agents, obtaining regular exercise, and cessation of smoking.   Thank you for allowing [place] to participate in this patient's care.  Clemon Chambers, RN, MSN, FNP-C [facility] [place] Office: [phone]  Clinic Physician: Kellie Simmering  07/31/2015, 12:35 PM

## 2015-07-31 NOTE — Progress Notes (Signed)
Filed Vitals:   07/31/15 1232 07/31/15 1237  BP: 159/65 139/59  Pulse: 69   Temp: 98.4 F (36.9 C)   TempSrc: Oral   Resp: 18   Height: 5\' 9"  (1.753 m)   Weight: 148 lb 14.4 oz (67.541 kg)   SpO2: 98%

## 2015-07-31 NOTE — Patient Instructions (Signed)
Smokeless Tobacco Use Smokeless tobacco is a loose, fine, or stringy tobacco. The tobacco is not smoked like a cigarette, but it is chewed or held in the lips or cheeks. It resembles tea and comes from the leaves of the tobacco plant. Smokeless tobacco is usually flavored, sweetened, or processed in some way. Although smokeless tobacco is not smoked into the lungs, its chemicals are absorbed through the membranes in the mouth and into the bloodstream. Its chemicals are also swallowed in saliva. The chemicals (nicotine and other toxins) are known to cause cancer. Smokeless tobacco contains up to 28 differentcarcinogens. CAUSES Nicotine is addictive. Smokeless tobacco contains nicotine, which is a stimulant. This stimulant can give you a "buzz" or altered state. People can become addicted to the feeling it delivers.  SYMPTOMS Smokeless tobacco can cause health problems, including:  Bad breath.  Yellow-brown teeth.  Mouth sores.  Cracking and bleeding lips.  Gum disease, gum recession, and bone loss around the teeth.  Tooth decay.  Increased or irregular heart rate.  High blood pressure, heart disease, and stroke.  Cancer of the mouth, lips, tongue, pancreas, voice box (larynx), esophagus, colon, and bladder.  Precancerous lesion of the soft tissues of the mouth (leukoplakia).  Loss of your sense of taste. TREATMENT Talk with your caregiver about ways you can quit. Quitting tobacco is a good decision for your health. Nicotine is addictive, but several options are available to help you quit including:  Nicotine replacement therapy (gum or patch).  Support and cessation programs. The following tips can help you quit:  Write down the reasons you would like to quit and look at them often.  Set a date during a low stress time to stop or cut back.  Ask family and friends for their support.  Remove all tobacco products from your home and work.  Replace the chewing tobacco with  things like beef jerky, sunflower seeds, or shredded coconut.  Avoid situations that may make you want to chew tobacco.  Exercise and eat a healthy diet.  When you crave tobacco, distract yourself with drinking water, sugarless chewing gum, sugarless hard candy, exercising, or deep breathing. HOME CARE INSTRUCTIONS  See your dentist for regular oral health exams every 6 months.  Follow up with your caregiver as recommended. SEEK MEDICAL CARE OR DENTAL CARE IF:  You have bleeding or cracking lips, gums, or cheeks.  You have mouth sores, discolorations, or pain.  You have tooth pain.  You develop persistent irritation, burning, or sores in the mouth.  You have pain, tenderness, or numbness in the mouth.  You develop a lump, bumpy patch, or hardened skin inside the mouth.  The color changes inside your mouth (gray, white, or red spots).  You have difficulty chewing, swallowing, or speaking. Document Released: 04/28/2011 Document Revised: 02/16/2012 Document Reviewed: 04/28/2011 [employer] Patient Information 2015 [employer]. This information is not intended to replace advice given to you by your health care provider. Make sure you discuss any questions you have with your health care provider.  

## 2015-08-01 NOTE — Addendum Note (Signed)
Addended by: Dorthula Rue L on: 08/01/2015 11:23 AM   Modules accepted: Orders

## 2015-08-02 ENCOUNTER — Telehealth: Payer: Self-pay | Admitting: *Deleted

## 2015-08-02 NOTE — Telephone Encounter (Signed)
Requesting surgical clearance:   1. Type of surgery: epidural steroid injection  2. Surgeon: Dr Ernestina Patches  3. Surgical date: TBA  4. Medications that need to be held: Plavix for 7 days  5. CAD: Yes-history of a remote RCA stent. Last Myoview was low risk in 2012     6. I will defer to: Dr Quay Burow

## 2015-08-04 NOTE — Telephone Encounter (Signed)
OK to interrupt plavix for spinal inj

## 2015-08-06 NOTE — Telephone Encounter (Signed)
Encounter routed to [facility]. Okay to hold Plavix 7 days prior to injection.

## 2015-09-13 ENCOUNTER — Telehealth: Payer: Self-pay

## 2015-09-13 DIAGNOSIS — R1909 Other intra-abdominal and pelvic swelling, mass and lump: Secondary | ICD-10-CM

## 2015-09-13 DIAGNOSIS — R1031 Right lower quadrant pain: Secondary | ICD-10-CM

## 2015-09-13 NOTE — Telephone Encounter (Signed)
Pt. Walked in to office and requested to have someone check his right groin.  C/o pain in the right groin x 3-4 days, intermittently.  Stated "it burns and hurts, and sometimes goes down in the inner thigh."  Questioned if there is swelling;  described feeling a lump, about the size of the end of his index finger, when he presses in, on the right groin.  Denied fever or chills.  Denied any drainage.  Discussed with Dr. Oneida Alar.  Recommended to schedule pt. For a femoral duplex to rule out pseudoaneurysm.  Pt. given appt. for 09/14/15 @ 10:00 AM.

## 2015-09-14 ENCOUNTER — Ambulatory Visit (INDEPENDENT_AMBULATORY_CARE_PROVIDER_SITE_OTHER): Payer: Medicare Other | Admitting: Family

## 2015-09-14 ENCOUNTER — Encounter: Payer: Self-pay | Admitting: Family

## 2015-09-14 ENCOUNTER — Ambulatory Visit (HOSPITAL_COMMUNITY)
Admission: RE | Admit: 2015-09-14 | Discharge: 2015-09-14 | Disposition: A | Discharge status: Home / Self Care | Payer: Medicare Other | Source: Ambulatory Visit | Attending: Family | Admitting: Family

## 2015-09-14 VITALS — BP 135/66 | HR 71 | Temp 97.9°F | Resp 16 | Ht 69.0 in | Wt 148.0 lb

## 2015-09-14 DIAGNOSIS — I6523 Occlusion and stenosis of bilateral carotid arteries: Secondary | ICD-10-CM

## 2015-09-14 DIAGNOSIS — R1031 Right lower quadrant pain: Secondary | ICD-10-CM | POA: Diagnosis present

## 2015-09-14 DIAGNOSIS — R1030 Lower abdominal pain, unspecified: Secondary | ICD-10-CM | POA: Diagnosis not present

## 2015-09-14 DIAGNOSIS — Z95828 Presence of other vascular implants and grafts: Secondary | ICD-10-CM | POA: Insufficient documentation

## 2015-09-14 DIAGNOSIS — I70201 Unspecified atherosclerosis of native arteries of extremities, right leg: Secondary | ICD-10-CM | POA: Insufficient documentation

## 2015-09-14 DIAGNOSIS — R1909 Other intra-abdominal and pelvic swelling, mass and lump: Secondary | ICD-10-CM

## 2015-09-14 DIAGNOSIS — I723 Aneurysm of iliac artery: Secondary | ICD-10-CM | POA: Diagnosis not present

## 2015-09-14 NOTE — Patient Instructions (Signed)
Peripheral Vascular Disease Peripheral vascular disease (PVD) is a disease of the blood vessels that are not part of your heart and brain. A simple term for PVD is poor circulation. In most cases, PVD narrows the blood vessels that carry blood from your heart to the rest of your body. This can result in a decreased supply of blood to your arms, legs, and internal organs, like your stomach or kidneys. However, it most often affects a person's lower legs and feet. There are two types of PVD.  Organic PVD. This is the more common type. It is caused by damage to the structure of blood vessels.  Functional PVD. This is caused by conditions that make blood vessels contract and tighten (spasm). Without treatment, PVD tends to get worse over time. PVD can also lead to acute ischemic limb. This is when an arm or limb suddenly has trouble getting enough blood. This is a medical emergency. CAUSES Each type of PVD has many different causes. The most common cause of PVD is buildup of a fatty material (plaque) inside of your arteries (atherosclerosis). Small amounts of plaque can break off from the walls of the blood vessels and become lodged in a smaller artery. This blocks blood flow and can cause acute ischemic limb. Other common causes of PVD include:  Blood clots that form inside of blood vessels.  Injuries to blood vessels.  Diseases that cause inflammation of blood vessels or cause blood vessel spasms.  Health behaviors and health history that increase your risk of developing PVD. RISK FACTORS  You may have a greater risk of PVD if you:  Have a family history of PVD.  Have certain medical conditions, including:  High cholesterol.  Diabetes.  High blood pressure (hypertension).  Coronary heart disease.  Past problems with blood clots.  Past injury, such as burns or a broken bone. These may have damaged blood vessels in your limbs.  Buerger disease. This is caused by inflamed blood  vessels in your hands and feet.  Some forms of arthritis.  Rare birth defects that affect the arteries in your legs.  Use tobacco.  Do not get enough exercise.  Are obese.  Are age 50 or older. SIGNS AND SYMPTOMS  PVD may cause many different symptoms. Your symptoms depend on what part of your body is not getting enough blood. Some common signs and symptoms include:  Cramps in your lower legs. This may be a symptom of poor leg circulation (claudication).  Pain and weakness in your legs while you are physically active that goes away when you rest (intermittent claudication).  Leg pain when at rest.  Leg numbness, tingling, or weakness.  Coldness in a leg or foot, especially when compared with the other leg.  Skin or hair changes. These can include:  Hair loss.  Shiny skin.  Pale or bluish skin.  Thick toenails.  Inability to get or maintain an erection (erectile dysfunction). People with PVD are more prone to developing ulcers and sores on their toes, feet, or legs. These may take longer than normal to heal. DIAGNOSIS Your health care provider may diagnose PVD from your signs and symptoms. The health care provider will also do a physical exam. You may have tests to find out what is causing your PVD and determine its severity. Tests may include:  Blood pressure recordings from your arms and legs and measurements of the strength of your pulses (pulse volume recordings).  Imaging studies using sound waves to take pictures of   the blood flow through your blood vessels (Doppler ultrasound).  Injecting a dye into your blood vessels before having imaging studies using:  X-rays (angiogram or arteriogram).  Computer-generated X-rays (CT angiogram).  A powerful electromagnetic field and a computer (magnetic resonance angiogram or MRA). TREATMENT Treatment for PVD depends on the cause of your condition and the severity of your symptoms. It also depends on your age. Underlying  causes need to be treated and controlled. These include long-lasting (chronic) conditions, such as diabetes, high cholesterol, and high blood pressure. You may need to first try making lifestyle changes and taking medicines. Surgery may be needed if these do not work. Lifestyle changes may include:  Quitting smoking.  Exercising regularly.  Following a low-fat, low-cholesterol diet. Medicines may include:  Blood thinners to prevent blood clots.  Medicines to improve blood flow.  Medicines to improve your blood cholesterol levels. Surgical procedures may include:  A procedure that uses an inflated balloon to open a blocked artery and improve blood flow (angioplasty).  A procedure to put in a tube (stent) to keep a blocked artery open (stent implant).  Surgery to reroute blood flow around a blocked artery (peripheral bypass surgery).  Surgery to remove dead tissue from an infected wound on the affected limb.  Amputation. This is surgical removal of the affected limb. This may be necessary in cases of acute ischemic limb that are not improved through medical or surgical treatments. HOME CARE INSTRUCTIONS  Take medicines only as directed by your health care provider.  Do not use any tobacco products, including cigarettes, chewing tobacco, or electronic cigarettes. If you need help quitting, ask your health care provider.  Lose weight if you are overweight, and maintain a healthy weight as directed by your health care provider.  Eat a diet that is low in fat and cholesterol. If you need help, ask your health care provider.  Exercise regularly. Ask your health care provider to suggest some good activities for you.  Use compression stockings or other mechanical devices as directed by your health care provider.  Take good care of your feet.  Wear comfortable shoes that fit well.  Check your feet often for any cuts or sores. SEEK MEDICAL CARE IF:  You have cramps in your legs  while walking.  You have leg pain when you are at rest.  You have coldness in a leg or foot.  Your skin changes.  You have erectile dysfunction.  You have cuts or sores on your feet that are not healing. SEEK IMMEDIATE MEDICAL CARE IF:  Your arm or leg turns cold and blue.  Your arms or legs become red, warm, swollen, painful, or numb.  You have chest pain or trouble breathing.  You suddenly have weakness in your face, arm, or leg.  You become very confused or lose the ability to speak.  You suddenly have a very bad headache or lose your vision.   This information is not intended to replace advice given to you by your health care provider. Make sure you discuss any questions you have with your health care provider.   Document Released: 01/01/2005 Document Revised: 12/15/2014 Document Reviewed: 05/04/2014 Elsevier Interactive Patient Education 2016 [employer]  

## 2015-09-14 NOTE — Progress Notes (Signed)
[facility] [place] HISTORY AND PHYSICAL -PAD  History of Present Illness [name] is a 81 y.o. male patient of Dr. Donnetta Hutching who is s/p Angioplasty / stenting iliac/Right Iliac - Gore Stent graft 03-2011 for abdominal aorta and iliac artery aneurysms.  He returns today with c/o pain in his right groin that radiates distally to his inner thigh; this started about 3-4 days ago. He denies any known straining or injury, but his caretaker with him states that he has been walking a great deal with his rolling walker.  I last saw pt on 07/31/15. At that time CTA abdomen/pelvis showed a stable repair of right common iliac artery aneurysm, no endo leak, with shrinking sac size to 3.1 cm (previously 3.7cm). There is stable occlusion of the right internal iliac artery trunk by coils.  Dr. Gwenlyn Found has been monitoring his carotid arteries. The patient has not had abdominal pain. He does have chronic back pain, now new back pain. Pt denies any history of stroke or TIA.   The pt is extremely hard of hearing; as best can be ascertained, he does not seem to have claudication in his legs with walking. He has no non healing wounds.  Pt Diabetic: No Pt smoker: former smoker, quit in 1992, but chews a great deal of tobacco all his life until he stopped this about February 2016.   Pt meds include: Statin :Yes Betablocker: Yes ASA: Yes Other anticoagulants/antiplatelets: Plavix  Past Medical History  Diagnosis Date  . Hypertension   . Hyperlipidemia   . Arthritis   . GERD (gastroesophageal reflux disease)   . Anemia   . Hiatal hernia   . Trouble swallowing   . AAA (abdominal aortic aneurysm) ([place])   . Macular degeneration   . Peripheral vascular disease ([place])   . CAD (coronary artery disease)   . Arthritis   . Anemia   . Cancer ([place]) 2016    skin caner: Forehead    Social History Social History  Substance Use Topics  . Smoking status: Former Smoker    Types: Cigarettes    Quit date: 12/08/1990  . Smokeless tobacco: Former Systems developer    Types: Chew    Quit date: 09/13/2014  . Alcohol Use: No    Family History Family History  Problem Relation Age of Onset  . Heart failure Mother   . Diabetes Mother   . Heart disease Mother   . Pneumonia Father   . Cancer Brother   . Diabetes Brother   . Heart disease Brother     Past Surgical History  Procedure Laterality Date  . Tonsillectomy    . Coronary angioplasty with stent placement    . Back surgery  09-05-2011    . Angioplasty / stenting iliac  03-2011    Right Iliac - Gore Stent graft   . Spine surgery      Allergies  Allergen Reactions  . Hydrocodone Anaphylaxis and Nausea And Vomiting  . Oxycodone Nausea And Vomiting    Current Outpatient Prescriptions  Medication Sig Dispense Refill  . acetaminophen (TYLENOL) 500 MG tablet Take 1,000 mg by mouth every 6 (six) hours as needed. Pain.    Marland Kitchen alfuzosin (UROXATRAL) 10 MG 24 hr tablet Take 10 mg by mouth daily.      Marland Kitchen ALPRAZolam (XANAX) 0.25 MG tablet Take 0.25 mg by mouth 2 (two) times daily as needed for sleep or anxiety.     Marland Kitchen aspirin EC 81 MG tablet Take 81  mg by mouth daily.      Marland Kitchen atorvastatin (LIPITOR) 20 MG tablet Take 20 mg by mouth daily.      . cetirizine (ZYRTEC) 5 MG tablet Take 5 mg by mouth daily as needed for allergies. Allergies.    Marland Kitchen clopidogrel (PLAVIX) 75 MG tablet Take 75 mg by mouth daily.      Marland Kitchen dutasteride (AVODART) 0.5 MG capsule Take 0.5 mg by mouth daily.    . ferrous sulfate 325 (65 FE) MG tablet Take 325 mg by mouth daily with breakfast.    . lansoprazole (PREVACID) 30 MG capsule Take 30 mg by mouth daily.    . meclizine (ANTIVERT) 50 MG tablet Take 0.5 tablets (25 mg total) by mouth 3 (three) times daily as needed for dizziness or nausea. 15 tablet 0  . metoprolol succinate (TOPROL-XL) 25 MG 24 hr tablet Take 12.5 mg by mouth 2 (two) times daily.    . Multiple Vitamin (MULTIVITAMIN PO) Take 1 tablet by mouth daily.     .  Multiple Vitamins-Minerals (ICAPS MV PO) Take 1 capsule by mouth at bedtime.     . penicillin v potassium (VEETID) 500 MG tablet Take 1 tablet by mouth 4 (four) times daily.  0  . Polyethylene Glycol 3350 (MIRALAX PO) Take 17 g by mouth at bedtime. Constipation.    . pregabalin (LYRICA) 50 MG capsule Take 50 mg by mouth at bedtime.    . Psyllium (METAMUCIL PO) Take 1 scoop by mouth daily.    . traMADol (ULTRAM) 50 MG tablet Take 50 mg by mouth every 8 (eight) hours.     No current facility-administered medications for this visit.    ROS: See HPI for pertinent positives and negatives.   Physical Examination  Filed Vitals:   09/14/15 1041  BP: 135/66  Pulse: 71  Temp: 97.9 F (36.6 C)  TempSrc: Oral  Resp: 16  Height: 5\' 9"  (1.753 m)  Weight: 148 lb (67.132 kg)  SpO2: 97%   Body mass index is 21.85 kg/(m^2).  General: A&O x 3, WD, walking with rolling walker, steady  Pulmonary: Sym exp, good air movt, CTAB, no rales, rhonchi, or wheezing  Cardiac: RRR, Nl S1, S2, no murmurs appreciated  Vascular: Vessel Right Left  Radial 2+Palpable 2+Palpable  Carotid without bruit without bruit  Aorta not palpable N/A  Femoral 3+Palpable 3+Palpable  Popliteal 1+ palpable 1+ palpable  PT Not Palpable Not Palpable  DP 1+Palpable 1+Palpable   Gastrointestinal: soft, NTND, -G/R, - HSM, - palpable masses, - CVAT B. Mild tenderness to palpation at right groin, no mass palpated. No right sided scrotal enlargement noted.  Musculoskeletal: M/S 4/5 throughout, Extremities without ischemic changes.  Neurologic: Pain and light touch intact in extremities, Motor exam as listed above. He is very hard of hearing with hearing aids in place.    CTA Abd/Pelvis Duplex (Date: 08/07/13)  An abdominal aorta endograft is present, extending from below the  level of the renal arteries inferiorly, and terminating in the  external iliac artery on the  right and the distal common iliac  artery on the left. When compared directly to the CTA of  10/12/2012, the maximal sac diameter distally is stable measuring  approximately 3.4 by 3.6 cm. The diameter of the thrombosed right  common iliac artery aneurysm is stable, measuring approximately 3.7  cm greatest diameter. The endograft appears patent.  Atherosclerotic calcification of the visceral branch vessels is  noted and appears similar to the CTA study performed  10/12/2012.  There is stable atherosclerotic disease involving both common femoral arteries.         CTA Abd/Pelvis Duplex (Date: 07/31/15):  FINDINGS: Since prior imaging, the bifurcated aortic endograft shows stable positioning and normal patency. Thrombosed aneurysm sac surrounding the endograft is stable in size with maximum dimensions of approximately 3.2 x 3.6 cm. There is no evidence of endoleak by CTA.  Thrombosed right common iliac artery aneurysm again noted surrounding endograft limb extension. This aneurysm shows decrease in size from 3.7 cm on the prior CTA to 3.1 cm. There is stable occlusion of the right internal iliac artery by coils. Native external iliac artery and common femoral artery show stable patency with stable calcified plaque in the common femoral artery and at the right SFA origin causing moderate stenosis. The left iliac limb shows stable patency and positioning, terminating in the distal left common iliac artery. Native external and common femoral arteries show stable patency on the left.  Visceral arteries show stable atherosclerosis with approximately 50- 60% stenosis at the origin of the celiac axis, 50- 70% stenosis in the proximal superior mesenteric artery and mild stenoses of bilateral renal arteries.  IMPRESSION: 1. Stable patency and positioning of aortic endograft. The aneurysm sac size is stable measuring 3.6 cm in greatest diameter. There is no evidence of endoleak  by CTA. 2. Stable exclusion of right common iliac artery aneurysm with decrease in thrombosed aneurysm sac size to 3.1 cm (previously 3.7 cm). There is stable occlusion of the right internal iliac artery trunk by coils. 3. Stable moderate disease involving the proximal celiac axis, proximal SMA, right common femoral artery and right SFA origin.          Non-Invasive Vascular Imaging: DATE: 09/14/2015 VASCULAR LAB EVALUATION    INDICATION: Right groin pain radiating to inner thigh, knot    PREVIOUS INTERVENTION(S): Status post right iliac stent graft repair  03/2011    DUPLEX EXAM:     FINDINGS: Significant mixed plaque noted in the right distal external iliac (263 cm), common femoral  (285 cms),and proximal superficial femoral artery (290 cms)  with increased velocity.    IMPRESSION:  1. No pseudoaneurysm visualized in right groin 2. Unable to visualized right iliac artery stent due to bowel gas (patient not NPO).     ASSESSMENT: ASHAAD GAERTNER is a 81 y.o. male who is s/p Angioplasty / stenting iliac/Right Iliac - Gore Stent graft 03-2011 for abdominal aorta and iliac artery aneurysms.  He c/o 3-4 days history of right groin pain that radiate down his inner right thigh. No pseudoaneurysm is seen on ultrasound today in his right groin, no mass is palpated in his right groin, but he does have mild tenderness to palpation in his right groin. Pt and his caregiver are advised to follow up with his PCP or urologist (Dr. Rosana Hoes) for evaluation of right groin mild tenderness. Consider evaluation for inguinal hernia.   He is to get a CTA abdomen/pelvis every 2 years to evaluate his EVAR as noted by Dr. Donnetta Hutching in his 10/12/12 progress note.   PLAN:  Based on the patient's vascular studies and examination, pt will return to clinic as already scheduled after August 2018 CTA abd/pelvis for surveillance of the endograft.  I discussed in depth with the patient the nature of atherosclerosis, and  emphasized the importance of maximal medical management including strict control of blood pressure, blood glucose, and lipid levels, obtaining regular exercise, and continued cessation of smoking.  The patient  is aware that without maximal medical management the underlying atherosclerotic disease process will progress, limiting the benefit of any interventions.  The patient was given information about PAD including signs, symptoms, treatment, what symptoms should prompt the patient to seek immediate medical care, and risk reduction measures to take.  Clemon Chambers, RN, MSN, FNP-C [facility] Phone: [phone]  Clinic MD: Bridgett Larsson  09/14/2015 11:10 AM

## 2015-11-03 ENCOUNTER — Emergency Department (HOSPITAL_COMMUNITY)
Admission: EM | Admit: 2015-11-03 | Discharge: 2015-11-03 | Disposition: A | Discharge status: Home / Self Care | Payer: Medicare Other | Attending: Emergency Medicine | Admitting: Emergency Medicine

## 2015-11-03 ENCOUNTER — Encounter (HOSPITAL_COMMUNITY): Payer: Self-pay

## 2015-11-03 DIAGNOSIS — I10 Essential (primary) hypertension: Secondary | ICD-10-CM | POA: Diagnosis not present

## 2015-11-03 DIAGNOSIS — Z862 Personal history of diseases of the blood and blood-forming organs and certain disorders involving the immune mechanism: Secondary | ICD-10-CM | POA: Diagnosis not present

## 2015-11-03 DIAGNOSIS — Z7982 Long term (current) use of aspirin: Secondary | ICD-10-CM | POA: Insufficient documentation

## 2015-11-03 DIAGNOSIS — R300 Dysuria: Secondary | ICD-10-CM | POA: Insufficient documentation

## 2015-11-03 DIAGNOSIS — R011 Cardiac murmur, unspecified: Secondary | ICD-10-CM | POA: Diagnosis not present

## 2015-11-03 DIAGNOSIS — N4889 Other specified disorders of penis: Secondary | ICD-10-CM | POA: Diagnosis not present

## 2015-11-03 DIAGNOSIS — Z7902 Long term (current) use of antithrombotics/antiplatelets: Secondary | ICD-10-CM | POA: Diagnosis not present

## 2015-11-03 DIAGNOSIS — Z85828 Personal history of other malignant neoplasm of skin: Secondary | ICD-10-CM | POA: Diagnosis not present

## 2015-11-03 DIAGNOSIS — E785 Hyperlipidemia, unspecified: Secondary | ICD-10-CM | POA: Diagnosis not present

## 2015-11-03 DIAGNOSIS — R1030 Lower abdominal pain, unspecified: Secondary | ICD-10-CM | POA: Diagnosis not present

## 2015-11-03 DIAGNOSIS — K219 Gastro-esophageal reflux disease without esophagitis: Secondary | ICD-10-CM | POA: Insufficient documentation

## 2015-11-03 DIAGNOSIS — I2510 Atherosclerotic heart disease of native coronary artery without angina pectoris: Secondary | ICD-10-CM | POA: Insufficient documentation

## 2015-11-03 DIAGNOSIS — Z79899 Other long term (current) drug therapy: Secondary | ICD-10-CM | POA: Insufficient documentation

## 2015-11-03 DIAGNOSIS — M1990 Unspecified osteoarthritis, unspecified site: Secondary | ICD-10-CM | POA: Diagnosis not present

## 2015-11-03 DIAGNOSIS — R39198 Other difficulties with micturition: Secondary | ICD-10-CM | POA: Insufficient documentation

## 2015-11-03 DIAGNOSIS — I739 Peripheral vascular disease, unspecified: Secondary | ICD-10-CM | POA: Insufficient documentation

## 2015-11-03 DIAGNOSIS — Z791 Long term (current) use of non-steroidal anti-inflammatories (NSAID): Secondary | ICD-10-CM | POA: Insufficient documentation

## 2015-11-03 LAB — URINALYSIS, ROUTINE W REFLEX MICROSCOPIC
BILIRUBIN URINE: NEGATIVE
Glucose, UA: NEGATIVE mg/dL
Ketones, ur: NEGATIVE mg/dL
Leukocytes, UA: NEGATIVE
Nitrite: NEGATIVE
Protein, ur: NEGATIVE mg/dL
Specific Gravity, Urine: <1.005 — ABNORMAL LOW (ref 1.005–1.030)
pH: 6.5 (ref 5.0–8.0)

## 2015-11-03 LAB — URINE MICROSCOPIC-ADD ON

## 2015-11-03 MED ORDER — PHENAZOPYRIDINE HCL 100 MG PO TABS
200.0000 mg | ORAL_TABLET | Freq: Once | ORAL | Status: AC
  Administered 2015-11-03: 200 mg via ORAL
  Filled 2015-11-03: qty 2

## 2015-11-03 MED ORDER — PHENAZOPYRIDINE HCL 200 MG PO TABS: 200.0000 mg | ORAL_TABLET | Freq: Two times a day (BID) | ORAL | Status: DC

## 2015-11-03 NOTE — ED Notes (Signed)
Pt c/o burning with urination x 1day, states he normally has difficulty urinating due to prostate issues, but is having more trouble.  Pt also states his urine smells strong

## 2015-11-03 NOTE — ED Notes (Signed)
Pt ambulated to restroom & returned back to room.

## 2015-11-03 NOTE — ED Notes (Signed)
Pt alert & oriented x4, stable gait. Patient  given discharge instructions, paperwork & prescription(s). Patient verbalized understanding. Pt left department w/ no further questions. 

## 2015-11-03 NOTE — ED Provider Notes (Signed)
CSN: [id number]     Arrival date & time 11/03/15  0009 History  By signing my name below, I, Hansel Feinstein, attest that this documentation has been prepared under the direction and in the presence of Delora Fuel, MD. Electronically Signed: Hansel Feinstein, ED Scribe. 11/03/2015. 12:58 AM.    Chief Complaint  Patient presents with  . Dysuria   The history is provided by the patient. No language interpreter was used.    HPI Comments: JOSEDE WARSHAUER is a 81 y.o. male with h/o HTN, HLD who presents to the Emergency Department complaining of moderate dysuria onset yesterday with associated intermittent, sharp penile pain, general feeling of being hot, difficulty passing urine, lower abdominal pain. He denies frequency.   Past Medical History  Diagnosis Date  . Hypertension   . Hyperlipidemia   . Arthritis   . GERD (gastroesophageal reflux disease)   . Anemia   . Hiatal hernia   . Trouble swallowing   . AAA (abdominal aortic aneurysm) ([place])   . Macular degeneration   . Peripheral vascular disease ([place])   . CAD (coronary artery disease)   . Arthritis   . Anemia   . Cancer ([place]) 2016    skin caner: Forehead   Past Surgical History  Procedure Laterality Date  . Tonsillectomy    . Coronary angioplasty with stent placement    . Back surgery  09-05-2011    . Angioplasty / stenting iliac  03-2011    Right Iliac - Gore Stent graft   . Spine surgery     Family History  Problem Relation Age of Onset  . Heart failure Mother   . Diabetes Mother   . Heart disease Mother   . Pneumonia Father   . Cancer Brother   . Diabetes Brother   . Heart disease Brother    Social History  Substance Use Topics  . Smoking status: Former Smoker    Types: Cigarettes    Quit date: 12/08/1990  . Smokeless tobacco: Former Systems developer    Types: Chew    Quit date: 09/13/2014  . Alcohol Use: No    Review of Systems  Gastrointestinal: Positive for abdominal pain.  Genitourinary: Positive for dysuria, difficulty  urinating and penile pain. Negative for frequency.  All other systems reviewed and are negative.  Allergies  Hydrocodone and Oxycodone  Home Medications   Prior to Admission medications   Medication Sig Start Date End Date Taking? Authorizing Provider  acetaminophen (TYLENOL) 500 MG tablet Take 1,000 mg by mouth every 6 (six) hours as needed. Pain.   Yes Historical Provider, MD  alfuzosin (UROXATRAL) 10 MG 24 hr tablet Take 10 mg by mouth daily.     Yes Historical Provider, MD  ALPRAZolam (XANAX) 0.25 MG tablet Take 0.25 mg by mouth 2 (two) times daily as needed for sleep or anxiety.    Yes Historical Provider, MD  aspirin EC 81 MG tablet Take 81 mg by mouth daily.     Yes Historical Provider, MD  atorvastatin (LIPITOR) 20 MG tablet Take 20 mg by mouth daily.     Yes Historical Provider, MD  cetirizine (ZYRTEC) 5 MG tablet Take 5 mg by mouth daily as needed for allergies. Allergies.   Yes Historical Provider, MD  clopidogrel (PLAVIX) 75 MG tablet Take 75 mg by mouth daily.     Yes Historical Provider, MD  ferrous sulfate 325 (65 FE) MG tablet Take 325 mg by mouth daily with breakfast.   Yes Historical Provider,  MD  lansoprazole (PREVACID) 30 MG capsule Take 30 mg by mouth daily. 10/22/13  Yes Historical Provider, MD  meclizine (ANTIVERT) 50 MG tablet Take 0.5 tablets (25 mg total) by mouth 3 (three) times daily as needed for dizziness or nausea. 08/15/14  Yes Orlie Dakin, MD  metoprolol succinate (TOPROL-XL) 25 MG 24 hr tablet Take 12.5 mg by mouth 2 (two) times daily. 11/21/13  Yes Historical Provider, MD  Multiple Vitamin (MULTIVITAMIN PO) Take 1 tablet by mouth daily.    Yes Historical Provider, MD  Multiple Vitamins-Minerals (ICAPS MV PO) Take 1 capsule by mouth at bedtime.    Yes Historical Provider, MD  Polyethylene Glycol 3350 (MIRALAX PO) Take 17 g by mouth at bedtime. Constipation.   Yes Historical Provider, MD  pregabalin (LYRICA) 50 MG capsule Take 50 mg by mouth at bedtime.   Yes  Historical Provider, MD  Psyllium (METAMUCIL PO) Take 1 scoop by mouth daily.   Yes Historical Provider, MD  traMADol (ULTRAM) 50 MG tablet Take 50 mg by mouth every 8 (eight) hours.   Yes Historical Provider, MD  dutasteride (AVODART) 0.5 MG capsule Take 0.5 mg by mouth daily.    Historical Provider, MD  penicillin v potassium (VEETID) 500 MG tablet Take 1 tablet by mouth 4 (four) times daily. 06/13/15   Historical Provider, MD   BP 169/72 mmHg  Pulse 60  Temp(Src) 98 F (36.7 C) (Oral)  Resp 21  Ht 5\' 9"  (1.753 m)  Wt 150 lb (68.04 kg)  BMI 22.14 kg/m2  SpO2 96% Physical Exam  Constitutional: He is oriented to person, place, and time. He appears well-developed and well-nourished.  HENT:  Head: Normocephalic and atraumatic.  Eyes: Conjunctivae and EOM are normal. Pupils are equal, round, and reactive to light.  Neck: Normal range of motion. Neck supple. No JVD present.  Cardiovascular: Normal rate and regular rhythm.   Murmur heard. Pulmonary/Chest: Effort normal and breath sounds normal. He has no wheezes. He has no rales. He exhibits no tenderness.  Lungs CTA bilaterally.   Abdominal: Soft. Bowel sounds are normal. He exhibits no distension and no mass. There is tenderness.  Mild suprapubic tenderness   Musculoskeletal: Normal range of motion. He exhibits no edema.  Lymphadenopathy:    He has no cervical adenopathy.  Neurological: He is alert and oriented to person, place, and time. No cranial nerve deficit. He exhibits normal muscle tone. Coordination normal.  Skin: Skin is warm and dry. No rash noted.  Psychiatric: He has a normal mood and affect. His behavior is normal. Judgment and thought content normal.  Nursing note and vitals reviewed.  ED Course  Procedures (including critical care time) DIAGNOSTIC STUDIES: Oxygen Saturation is 96% on RA, normal by my interpretation.    COORDINATION OF CARE: 12:56 AM Discussed treatment plan with pt at bedside and pt agreed to  plan.   Labs Review Results for orders placed or performed during the hospital encounter of 11/03/15  Urinalysis, Routine w reflex microscopic (not at [facility])  Result Value Ref Range   Color, Urine YELLOW YELLOW   APPearance CLEAR CLEAR   Specific Gravity, Urine <1.005 (L) 1.005 - 1.030   pH 6.5 5.0 - 8.0   Glucose, UA NEGATIVE NEGATIVE mg/dL   Hgb urine dipstick TRACE (A) NEGATIVE   Bilirubin Urine NEGATIVE NEGATIVE   Ketones, ur NEGATIVE NEGATIVE mg/dL   Protein, ur NEGATIVE NEGATIVE mg/dL   Nitrite NEGATIVE NEGATIVE   Leukocytes, UA NEGATIVE NEGATIVE  Urine microscopic-add on  Result Value Ref Range   Squamous Epithelial / LPF 0-5 (A) NONE SEEN   WBC, UA 0-5 0 - 5 WBC/hpf   RBC / HPF 0-5 0 - 5 RBC/hpf   Bacteria, UA RARE (A) NONE SEEN   I have personally reviewed and evaluated these lab results as part of my medical decision-making.  MDM   Final diagnoses:  Dysuria    Dysuria, rule out urinary tract infection. Urinalysis will be checked.  Urinalysis is come back without any evidence of infection. Cause for dysuria is not clear. He is discharged with prescription for Canasa. Dean and referred back to his urologist.  I personally performed the services described in this documentation, which was scribed in my presence. The recorded information has been reviewed and is accurate.      Delora Fuel, MD [id number] [phone]

## 2015-11-03 NOTE — Discharge Instructions (Signed)
Dysuria  Dysuria is pain or discomfort while urinating. The pain or discomfort may be felt in the tube that carries urine out of the bladder (urethra) or in the surrounding tissue of the genitals. The pain may also be felt in the groin area, lower abdomen, and lower back. You may have to urinate frequently or have the sudden feeling that you have to urinate (urgency). Dysuria can affect both men and women, but is more common in women.  Dysuria can be caused by many different things, including:   Urinary tract infection in women.   Infection of the kidney or bladder.   Kidney stones or bladder stones.   Certain sexually transmitted infections (STIs), such as chlamydia.   Dehydration.   Inflammation of the vagina.   Use of certain medicines.   Use of certain soaps or scented products that cause irritation.  HOME CARE INSTRUCTIONS  Watch your dysuria for any changes. The following actions may help to reduce any discomfort you are feeling:   Drink enough fluid to keep your urine clear or pale yellow.   Empty your bladder often. Avoid holding urine for long periods of time.   After a bowel movement or urination, women should cleanse from front to back, using each tissue only once.   Empty your bladder after sexual intercourse.   Take medicines only as directed by your health care provider.   If you were prescribed an antibiotic medicine, finish it all even if you start to feel better.   Avoid caffeine, tea, and alcohol. They can irritate the bladder and make dysuria worse. In men, alcohol may irritate the prostate.   Keep all follow-up visits as directed by your health care provider. This is important.   If you had any tests done to find the cause of dysuria, it is your responsibility to obtain your test results. Ask the lab or department performing the test when and how you will get your results. Talk with your health care provider if you have any questions about your results.  SEEK MEDICAL CARE  IF:   You develop pain in your back or sides.   You have a fever.   You have nausea or vomiting.   You have blood in your urine.   You are not urinating as often as you usually do.  SEEK IMMEDIATE MEDICAL CARE IF:   You pain is severe and not relieved with medicines.   You are unable to hold down any fluids.   You or someone else notices a change in your mental function.   You have a rapid heartbeat at rest.   You have shaking or chills.   You feel extremely weak.     This information is not intended to replace advice given to you by your health care provider. Make sure you discuss any questions you have with your health care provider.     Document Released: 08/22/2004 Document Revised: 12/15/2014 Document Reviewed: 07/20/2014  Elsevier Interactive Patient Education 2016 Elsevier Inc.  Phenazopyridine tablets  What is this medicine?  PHENAZOPYRIDINE (fen az oh PEER i deen) is a pain reliever. It is used to stop the pain, burning, or discomfort caused by infection or irritation of the urinary tract. This medicine is not an antibiotic. It will not cure a urinary tract infection.  This medicine may be used for other purposes; ask your health care provider or pharmacist if you have questions.  What should I tell my health care provider before I take   this medicine?  They need to know if you have any of these conditions:  -glucose-6-phosphate dehydrogenase (G6PD) deficiency  -kidney disease  -an unusual or allergic reaction to phenazopyridine, other medicines, foods, dyes, or preservatives  -pregnant or trying to get pregnant  -breast-feeding  How should I use this medicine?  Take this medicine by mouth with a glass of water. Follow the directions on the prescription label. Take after meals. Take your doses at regular intervals. Do not take your medicine more often than directed. Do not skip doses or stop your medicine early even if you feel better. Do not stop taking except on your doctor's advice.  Talk to  your pediatrician regarding the use of this medicine in children. Special care may be needed.  Overdosage: If you think you have taken too much of this medicine contact a poison control center or emergency room at once.  NOTE: This medicine is only for you. Do not share this medicine with others.  What if I miss a dose?  If you miss a dose, take it as soon as you can. If it is almost time for your next dose, take only that dose. Do not take double or extra doses.  What may interact with this medicine?  Interactions are not expected.  This list may not describe all possible interactions. Give your health care provider a list of all the medicines, herbs, non-prescription drugs, or dietary supplements you use. Also tell them if you smoke, drink alcohol, or use illegal drugs. Some items may interact with your medicine.  What should I watch for while using this medicine?  Tell your doctor or health care professional if your symptoms do not improve or if they get worse.  This medicine colors body fluids red. This effect is harmless and will go away after you are done taking the medicine. It will change urine to an dark orange or red color. The red color may stain clothing. Soft contact lenses may become permanently stained. It is best not to wear soft contact lenses while taking this medicine.  If you are diabetic you may get a false positive result for sugar in your urine. Talk to your health care provider.  What side effects may I notice from receiving this medicine?  Side effects that you should report to your doctor or health care professional as soon as possible:  -allergic reactions like skin rash, itching or hives, swelling of the face, lips, or tongue  -blue or purple color of the skin  -difficulty breathing  -fever  -less urine  -unusual bleeding, bruising  -unusual tired, weak  -vomiting  -yellowing of the eyes or skin  Side effects that usually do not require medical attention (report to your doctor or health  care professional if they continue or are bothersome):  -dark urine  -headache  -stomach upset  This list may not describe all possible side effects. Call your doctor for medical advice about side effects. You may report side effects to FDA at 1-800-FDA-1088.  Where should I keep my medicine?  Keep out of the reach of children.  Store at room temperature between 15 and 30 degrees C (59 and 86 degrees F). Protect from light and moisture. Throw away any unused medicine after the expiration date.  NOTE: This sheet is a summary. It may not cover all possible information. If you have questions about this medicine, talk to your doctor, pharmacist, or health care provider.      2016, Elsevier/Gold Standard. (

## 2015-11-20 ENCOUNTER — Other Ambulatory Visit: Payer: Self-pay

## 2015-11-21 LAB — CBC WITH DIFFERENTIAL/PLATELET
BASOS ABS: 0 10*3/uL (ref 0.0–0.2)
BASOS: 1 %
EOS (ABSOLUTE): 0 10*3/uL (ref 0.0–0.4)
Eos: 1 %
HEMOGLOBIN: 12.4 g/dL — AB (ref 12.6–17.7)
Hematocrit: 36 % — ABNORMAL LOW (ref 37.5–51.0)
IMMATURE GRANS (ABS): 0 10*3/uL (ref 0.0–0.1)
Immature Granulocytes: 0 %
LYMPHS ABS: 1 10*3/uL (ref 0.7–3.1)
LYMPHS: 22 %
MCH: 31.7 pg (ref 26.6–33.0)
MCHC: 34.4 g/dL (ref 31.5–35.7)
MCV: 92 fL (ref 79–97)
MONOCYTES: 8 %
Monocytes Absolute: 0.4 10*3/uL (ref 0.1–0.9)
NEUTROS ABS: 3.2 10*3/uL (ref 1.4–7.0)
Neutrophils: 68 %
Platelets: 150 10*3/uL (ref 150–379)
RBC: 3.91 x10E6/uL — ABNORMAL LOW (ref 4.14–5.80)
RDW: 13.8 % (ref 12.3–15.4)
WBC: 4.6 10*3/uL (ref 3.4–10.8)

## 2015-12-11 ENCOUNTER — Ambulatory Visit (INDEPENDENT_AMBULATORY_CARE_PROVIDER_SITE_OTHER): Payer: Medicare Other | Admitting: Cardiovascular Disease

## 2015-12-11 ENCOUNTER — Encounter: Payer: Self-pay | Admitting: Cardiovascular Disease

## 2015-12-11 VITALS — BP 178/62 | HR 59 | Ht 70.0 in | Wt 150.0 lb

## 2015-12-11 DIAGNOSIS — Z9861 Coronary angioplasty status: Secondary | ICD-10-CM | POA: Diagnosis not present

## 2015-12-11 DIAGNOSIS — I2510 Atherosclerotic heart disease of native coronary artery without angina pectoris: Secondary | ICD-10-CM | POA: Diagnosis not present

## 2015-12-11 DIAGNOSIS — E7800 Pure hypercholesterolemia, unspecified: Secondary | ICD-10-CM

## 2015-12-11 DIAGNOSIS — I6521 Occlusion and stenosis of right carotid artery: Secondary | ICD-10-CM | POA: Diagnosis not present

## 2015-12-11 DIAGNOSIS — I10 Essential (primary) hypertension: Secondary | ICD-10-CM | POA: Diagnosis not present

## 2015-12-11 NOTE — Patient Instructions (Signed)

## 2015-12-11 NOTE — Assessment & Plan Note (Signed)
History of hyperlipidemia on statin therapy followed by his PCP 

## 2015-12-11 NOTE — Assessment & Plan Note (Signed)
History of CAD status post RCA stenting by Dr. Rollene Fare using a Taxus drug eluting stent in 2006. He had moderate circumflex disease and normal LV function at that time. He had a negative Myoview stress test several years ago and denies chest pain or shortness of breath.

## 2015-12-11 NOTE — Progress Notes (Signed)
12/11/2015 [name]   07-Jan-1918  [id number]  Primary Physician Philis Fendt, MD Primary Cardiologist: Lorretta Harp MD Renae Gloss   HPI:   [name] is a 82 year old widowed Caucasian male father of one daughter who is accompanying him today. He was formally patient of Dr. Lowella Fairy. I am assumed his care. I last saw him one year ago. He is retired from [employer] as a Teacher, early years/pre. His daughter lives next-door to him. The patient lives independently. He has a history of remote tobacco abuse, hypertension and hyperlipidemia. He has not had a heart attack or stroke. He does have known coronary artery disease status post RCA stenting using a Taxus drug-eluting stent by Dr. Rollene Fare back in 2006 with moderate circumflex disease and normal LV function. He had anegative Myoview stress test several years ago and denies chest pain or shortness of breath. He did have a history of abdominal aortic aneurysm and had endoluminal stent grafting by Dr. Sherren Mocha Early back in 2011. He also has asymptomatic moderate left internal carotid artery stenosis which we followed by duplex ultrasound   Current Outpatient Prescriptions  Medication Sig Dispense Refill  . acetaminophen (TYLENOL) 500 MG tablet Take 1,000 mg by mouth every 6 (six) hours as needed. Pain.    Marland Kitchen alfuzosin (UROXATRAL) 10 MG 24 hr tablet Take 10 mg by mouth daily.      Marland Kitchen ALPRAZolam (XANAX) 0.25 MG tablet Take 0.25 mg by mouth 2 (two) times daily as needed for sleep or anxiety.     Marland Kitchen aspirin EC 81 MG tablet Take 81 mg by mouth daily.      Marland Kitchen atorvastatin (LIPITOR) 20 MG tablet Take 20 mg by mouth daily.      . cetirizine (ZYRTEC) 5 MG tablet Take 5 mg by mouth daily as needed for allergies. Allergies.    Marland Kitchen clopidogrel (PLAVIX) 75 MG tablet Take 75 mg by mouth daily.      Marland Kitchen dutasteride (AVODART) 0.5 MG capsule Take 0.5 mg by mouth daily.    . ferrous sulfate 325 (65 FE) MG tablet Take 325 mg by mouth daily with  breakfast.    . lansoprazole (PREVACID) 30 MG capsule Take 30 mg by mouth daily.    . meclizine (ANTIVERT) 50 MG tablet Take 0.5 tablets (25 mg total) by mouth 3 (three) times daily as needed for dizziness or nausea. 15 tablet 0  . metoprolol succinate (TOPROL-XL) 25 MG 24 hr tablet Take 12.5 mg by mouth 2 (two) times daily.    . Multiple Vitamin (MULTIVITAMIN PO) Take 1 tablet by mouth daily.     . Multiple Vitamins-Minerals (ICAPS MV PO) Take 1 capsule by mouth at bedtime.     . phenazopyridine (PYRIDIUM) 200 MG tablet Take 1 tablet (200 mg total) by mouth 2 (two) times daily. 6 tablet 0  . Polyethylene Glycol 3350 (MIRALAX PO) Take 17 g by mouth at bedtime. Constipation.    . pregabalin (LYRICA) 50 MG capsule Take 50 mg by mouth at bedtime.    . Psyllium (METAMUCIL PO) Take 1 scoop by mouth daily.    . traMADol (ULTRAM) 50 MG tablet Take 50 mg by mouth every 8 (eight) hours.     No current facility-administered medications for this visit.    Allergies  Allergen Reactions  . Hydrocodone Anaphylaxis and Nausea And Vomiting  . Oxycodone Nausea And Vomiting    Social History   Social History  . Marital Status: Widowed  Spouse Name: N/A  . Number of Children: N/A  . Years of Education: N/A   Occupational History  . Not on file.   Social History Main Topics  . Smoking status: Former Smoker    Types: Cigarettes    Quit date: 12/08/1990  . Smokeless tobacco: Former Systems developer    Types: Chew    Quit date: 09/13/2014  . Alcohol Use: No  . Drug Use: No  . Sexual Activity: Not on file   Other Topics Concern  . Not on file   Social History Narrative     Review of Systems: General: negative for chills, fever, night sweats or weight changes.  Cardiovascular: negative for chest pain, dyspnea on exertion, edema, orthopnea, palpitations, paroxysmal nocturnal dyspnea or shortness of breath Dermatological: negative for rash Respiratory: negative for cough or wheezing Urologic:  negative for hematuria Abdominal: negative for nausea, vomiting, diarrhea, bright red blood per rectum, melena, or hematemesis Neurologic: negative for visual changes, syncope, or dizziness All other systems reviewed and are otherwise negative except as noted above.    Blood pressure 178/62, pulse 59, height 5\' 10"  (1.778 m), weight 150 lb (68.04 kg).  General appearance: alert and no distress Neck: no adenopathy, no JVD, supple, symmetrical, trachea midline, thyroid not enlarged, symmetric, no tenderness/mass/nodules and soft right carotid bruit Lungs: clear to auscultation bilaterally Heart: regular rate and rhythm, S1, S2 normal, no murmur, click, rub or gallop Extremities: extremities normal, atraumatic, no cyanosis or edema  EKG not performed today  ASSESSMENT AND PLAN:   HYPERCHOLESTEROLEMIA History of hyper-lipidemia on statin therapy followed by his PCP  Essential hypertension History of hypertension with blood pressure measured 178/62 which is moderately elevated for him. He is on Toprol. Continue current meds at current dosing  CAD S/P RCA stent '06 History of CAD status post RCA stenting by Dr. Rollene Fare using a Taxus drug eluting stent in 2006. He had moderate circumflex disease and normal LV function at that time. He had a negative Myoview stress test several years ago and denies chest pain or shortness of breath.  Carotid stenosis History of moderate carotid disease by duplex ultrasound last checked several years ago. He is neurologically symptomatically. Given his age I decided not to continue following this      Lorretta Harp MD [facility], [facility] 12/11/2015 2:21 PM

## 2015-12-11 NOTE — Assessment & Plan Note (Signed)
History of hypertension with blood pressure measured 178/62 which is moderately elevated for him. He is on Toprol. Continue current meds at current dosing

## 2015-12-11 NOTE — Assessment & Plan Note (Signed)
History of moderate carotid disease by duplex ultrasound last checked several years ago. He is neurologically symptomatically. Given his age I decided not to continue following this

## 2016-05-07 ENCOUNTER — Encounter (HOSPITAL_COMMUNITY): Payer: Self-pay

## 2016-05-07 ENCOUNTER — Emergency Department (HOSPITAL_COMMUNITY)
Admission: EM | Admit: 2016-05-07 | Discharge: 2016-05-07 | Disposition: A | Discharge status: Home / Self Care | Payer: Medicare Other | Attending: Emergency Medicine | Admitting: Emergency Medicine

## 2016-05-07 DIAGNOSIS — Z85828 Personal history of other malignant neoplasm of skin: Secondary | ICD-10-CM | POA: Diagnosis not present

## 2016-05-07 DIAGNOSIS — E785 Hyperlipidemia, unspecified: Secondary | ICD-10-CM | POA: Diagnosis not present

## 2016-05-07 DIAGNOSIS — I2510 Atherosclerotic heart disease of native coronary artery without angina pectoris: Secondary | ICD-10-CM | POA: Diagnosis not present

## 2016-05-07 DIAGNOSIS — I10 Essential (primary) hypertension: Secondary | ICD-10-CM | POA: Diagnosis not present

## 2016-05-07 DIAGNOSIS — Z7982 Long term (current) use of aspirin: Secondary | ICD-10-CM | POA: Diagnosis not present

## 2016-05-07 DIAGNOSIS — E871 Hypo-osmolality and hyponatremia: Secondary | ICD-10-CM | POA: Diagnosis not present

## 2016-05-07 DIAGNOSIS — K5900 Constipation, unspecified: Secondary | ICD-10-CM | POA: Insufficient documentation

## 2016-05-07 DIAGNOSIS — Z87891 Personal history of nicotine dependence: Secondary | ICD-10-CM | POA: Diagnosis not present

## 2016-05-07 DIAGNOSIS — M1990 Unspecified osteoarthritis, unspecified site: Secondary | ICD-10-CM | POA: Diagnosis not present

## 2016-05-07 DIAGNOSIS — R42 Dizziness and giddiness: Secondary | ICD-10-CM

## 2016-05-07 DIAGNOSIS — Z79899 Other long term (current) drug therapy: Secondary | ICD-10-CM | POA: Insufficient documentation

## 2016-05-07 LAB — CBC
HEMATOCRIT: 37.9 % — AB (ref 39.0–52.0)
HEMOGLOBIN: 12.7 g/dL — AB (ref 13.0–17.0)
MCH: 31.8 pg (ref 26.0–34.0)
MCHC: 33.5 g/dL (ref 30.0–36.0)
MCV: 94.8 fL (ref 78.0–100.0)
Platelets: 133 10*3/uL — ABNORMAL LOW (ref 150–400)
RBC: 4 MIL/uL — AB (ref 4.22–5.81)
RDW: 13.8 % (ref 11.5–15.5)
WBC: 4.4 10*3/uL (ref 4.0–10.5)

## 2016-05-07 LAB — BASIC METABOLIC PANEL
ANION GAP: 7 (ref 5–15)
BUN: 19 mg/dL (ref 6–20)
CHLORIDE: 96 mmol/L — AB (ref 101–111)
CO2: 28 mmol/L (ref 22–32)
Calcium: 8.8 mg/dL — ABNORMAL LOW (ref 8.9–10.3)
Creatinine, Ser: 0.73 mg/dL (ref 0.61–1.24)
GFR calc non Af Amer: >60 mL/min (ref >60)
Glucose, Bld: 112 mg/dL — ABNORMAL HIGH (ref 65–99)
Potassium: 3.8 mmol/L (ref 3.5–5.1)
SODIUM: 131 mmol/L — AB (ref 135–145)

## 2016-05-07 MED ORDER — METOPROLOL TARTRATE 25 MG PO TABS
12.5000 mg | ORAL_TABLET | Freq: Once | ORAL | Status: AC
Start: 2016-05-07 — End: 2016-05-07
  Administered 2016-05-07: 12.5 mg via ORAL
  Filled 2016-05-07: qty 1

## 2016-05-07 MED ORDER — METOPROLOL TARTRATE 25 MG PO TABS
12.5000 mg | ORAL_TABLET | Freq: Once | ORAL | Status: AC
  Administered 2016-05-07: 12.5 mg via ORAL
  Filled 2016-05-07: qty 1

## 2016-05-07 MED ORDER — ALPRAZOLAM 0.5 MG PO TABS
0.2500 mg | ORAL_TABLET | Freq: Once | ORAL | Status: AC
  Administered 2016-05-07: 0.25 mg via ORAL
  Filled 2016-05-07: qty 1

## 2016-05-07 MED ORDER — LABETALOL HCL 5 MG/ML IV SOLN
10.0000 mg | Freq: Once | INTRAVENOUS | Status: AC
  Administered 2016-05-07: 10 mg via INTRAVENOUS
  Filled 2016-05-07: qty 4

## 2016-05-07 MED ORDER — LABETALOL HCL 5 MG/ML IV SOLN
5.0000 mg | Freq: Once | INTRAVENOUS | Status: AC
  Administered 2016-05-07: 5 mg via INTRAVENOUS
  Filled 2016-05-07: qty 4

## 2016-05-07 NOTE — ED Notes (Signed)
Pt very anxious and BP remains elevated. Dr Jeneen Rinks in to see with order to adm xanax 0.25 po and metoprolol 12.5mg  po and discharge

## 2016-05-07 NOTE — Discharge Instructions (Signed)
Be careful walking, use your walker. If you were given medicines take as directed.  If you are on coumadin or contraceptives realize their levels and effectiveness is altered by many different medicines.  If you have any reaction (rash, tongues swelling, other) to the medicines stop taking and see a physician.    If your blood pressure was elevated in the ER make sure you follow up for management with a primary doctor or return for chest pain, shortness of breath or stroke symptoms.  Please follow up as directed and return to the ER or see a physician for new or worsening symptoms.  Thank you. Filed Vitals:   05/07/16 1120 05/07/16 1130 05/07/16 1139 05/07/16 1354  BP: 171/74 180/79  189/83  Pulse: 64   69  Temp: 98.4 F (36.9 C)     TempSrc: Oral     Resp: 18   20  Height: 5\' 9"  (1.753 m)     Weight: 140 lb (63.504 kg)     SpO2: 97%  96% 98%

## 2016-05-07 NOTE — ED Notes (Signed)
Pt ambulated with rollator with staff without difficulty

## 2016-05-07 NOTE — ED Provider Notes (Signed)
CSN: [id number]     Arrival date & time 05/07/16  1118 History   First MD Initiated Contact with Patient 05/07/16 1134     Chief Complaint  Patient presents with  . Constipation     (Consider location/radiation/quality/duration/timing/severity/associated sxs/prior Treatment) HPI Comments: 82 year old male with history of cholesterol, high blood pressure, macular degeneration and vision difficulties, pneumonia presents with concern for constipation and intermittent dizziness. Patient's had difficulty with constipation past 2-3 days. Patient normally takes a stool softener when this occurs. Patient had large bowel movement prior to my arrival in the room. No blood reported. Patient has an Environmental consultant that helps him throughout the day. Patient states he has mild dizziness that is had before worse with moving head at times. Patient has small stroke years ago. No focal deficits. Patient's overall at his baseline however worried about his blood pressure being elevated.  Patient is a 82 y.o. male presenting with constipation. The history is provided by the patient.  Constipation Associated symptoms: no abdominal pain, no back pain, no dysuria, no fever and no vomiting     Past Medical History  Diagnosis Date  . Hypertension   . Hyperlipidemia   . Arthritis   . GERD (gastroesophageal reflux disease)   . Anemia   . Hiatal hernia   . Trouble swallowing   . AAA (abdominal aortic aneurysm) ([place])   . Macular degeneration   . Peripheral vascular disease ([place])   . CAD (coronary artery disease)   . Arthritis   . Anemia   . Cancer ([place]) 2016    skin caner: Forehead   Past Surgical History  Procedure Laterality Date  . Tonsillectomy    . Coronary angioplasty with stent placement    . Back surgery  09-05-2011    . Angioplasty / stenting iliac  03-2011    Right Iliac - Gore Stent graft   . Spine surgery     Family History  Problem Relation Age of Onset  . Heart failure Mother   . Diabetes  Mother   . Heart disease Mother   . Pneumonia Father   . Cancer Brother   . Diabetes Brother   . Heart disease Brother    Social History  Substance Use Topics  . Smoking status: Former Smoker    Types: Cigarettes    Quit date: 12/08/1990  . Smokeless tobacco: Former Systems developer    Types: Chew    Quit date: 09/13/2014  . Alcohol Use: No    Review of Systems  Constitutional: Negative for fever and chills.  HENT: Negative for congestion.   Eyes: Negative for visual disturbance.  Respiratory: Negative for shortness of breath.   Cardiovascular: Negative for chest pain.  Gastrointestinal: Positive for constipation. Negative for vomiting and abdominal pain.  Genitourinary: Negative for dysuria and flank pain.  Musculoskeletal: Negative for back pain, neck pain and neck stiffness.  Skin: Negative for rash.  Neurological: Positive for dizziness. Negative for light-headedness and headaches.      Allergies  Hydrocodone and Oxycodone  Home Medications   Prior to Admission medications   Medication Sig Start Date End Date Taking? Authorizing Provider  acetaminophen (TYLENOL) 500 MG tablet Take 1,000 mg by mouth every 6 (six) hours as needed. Pain.    Historical Provider, MD  alfuzosin (UROXATRAL) 10 MG 24 hr tablet Take 10 mg by mouth daily.      Historical Provider, MD  ALPRAZolam Duanne Moron) 0.25 MG tablet Take 0.25 mg by mouth 2 (two) times daily as  needed for sleep or anxiety.     Historical Provider, MD  aspirin EC 81 MG tablet Take 81 mg by mouth daily.      Historical Provider, MD  atorvastatin (LIPITOR) 20 MG tablet Take 20 mg by mouth daily.      Historical Provider, MD  cetirizine (ZYRTEC) 5 MG tablet Take 5 mg by mouth daily as needed for allergies. Allergies.    Historical Provider, MD  clopidogrel (PLAVIX) 75 MG tablet Take 75 mg by mouth daily.      Historical Provider, MD  dutasteride (AVODART) 0.5 MG capsule Take 0.5 mg by mouth daily.    Historical Provider, MD  ferrous  sulfate 325 (65 FE) MG tablet Take 325 mg by mouth daily with breakfast.    Historical Provider, MD  lansoprazole (PREVACID) 30 MG capsule Take 30 mg by mouth daily. 10/22/13   Historical Provider, MD  meclizine (ANTIVERT) 50 MG tablet Take 0.5 tablets (25 mg total) by mouth 3 (three) times daily as needed for dizziness or nausea. 08/15/14   Orlie Dakin, MD  metoprolol succinate (TOPROL-XL) 25 MG 24 hr tablet Take 12.5 mg by mouth 2 (two) times daily. 11/21/13   Historical Provider, MD  Multiple Vitamin (MULTIVITAMIN PO) Take 1 tablet by mouth daily.     Historical Provider, MD  Multiple Vitamins-Minerals (ICAPS MV PO) Take 1 capsule by mouth at bedtime.     Historical Provider, MD  phenazopyridine (PYRIDIUM) 200 MG tablet Take 1 tablet (200 mg total) by mouth 2 (two) times daily. [id number]   Delora Fuel, MD  Polyethylene Glycol 3350 (MIRALAX PO) Take 17 g by mouth at bedtime. Constipation.    Historical Provider, MD  pregabalin (LYRICA) 50 MG capsule Take 50 mg by mouth at bedtime.    Historical Provider, MD  Psyllium (METAMUCIL PO) Take 1 scoop by mouth daily.    Historical Provider, MD  traMADol (ULTRAM) 50 MG tablet Take 50 mg by mouth every 8 (eight) hours.    Historical Provider, MD   BP 189/85 mmHg  Pulse 71  Temp(Src) 98.4 F (36.9 C) (Oral)  Resp 18  Ht 5\' 9"  (1.753 m)  Wt 140 lb (63.504 kg)  BMI 20.67 kg/m2  SpO2 97% Physical Exam  Constitutional: He is oriented to person, place, and time. He appears well-developed and well-nourished.  HENT:  Head: Normocephalic and atraumatic.  Eyes: Right eye exhibits no discharge. Left eye exhibits no discharge.  Neck: Normal range of motion. Neck supple. No tracheal deviation present.  Cardiovascular: Normal rate and regular rhythm.   Pulmonary/Chest: Effort normal and breath sounds normal.  Abdominal: Soft. He exhibits no distension. There is no tenderness. There is no guarding.  Musculoskeletal: He exhibits no edema.  Neurological: He  is alert and oriented to person, place, and time. No cranial nerve deficit. GCS eye subscore is 4. GCS verbal subscore is 5. GCS motor subscore is 6.  Finger nose intact. Patient walks with a walker. 5+ strength equal upper lower extremity's. No arm or leg drift. Sensation intact palpation patient has difficulty with hearing which is chronic patient has decreased vision which is chronic however extraocular muscle function intact.  Skin: Skin is warm. No rash noted.  Psychiatric: He has a normal mood and affect.  Nursing note and vitals reviewed.   ED Course  Procedures (including critical care time) Labs Review Labs Reviewed  CBC - Abnormal; Notable for the following:    RBC 4.00 (*)    Hemoglobin 12.7 (*)  HCT 37.9 (*)    Platelets 133 (*)    All other components within normal limits  BASIC METABOLIC PANEL - Abnormal; Notable for the following:    Sodium 131 (*)    Chloride 96 (*)    Glucose, Bld 112 (*)    Calcium 8.8 (*)    All other components within normal limits    Imaging Review No results found. I have personally reviewed and evaluated these images and lab results as part of my medical decision-making.   EKG Interpretation None      MDM   Final diagnoses:  Constipation, unspecified constipation type  Dizziness  Hyponatremia  Essential hypertension   Patient presents with concern for constipation, he had a large bowel movement on my arrival in his room. It was nonbloody. Patient has softeners at home as needed. Patient is also had mild dizziness since this morning he takes meclizine as needed. Patient has nonfocal neuro exam and has a walker. I do not feel he needs an emergent MRI or further workup at this time. Screening blood work to check his sodium and hemoglobin.  Labs unremarkable. Patient ambulated baseline. Patient's blood pressure continued to trend up towards no chest pain or shortness of breath. No signs of stroke. Oral medicines given followed by small  IV dose. Patient only close follow-up outpatient for his high blood pressure. Results and differential diagnosis were discussed with the patient/parent/guardian. Xrays were independently reviewed by myself.  Close follow up outpatient was discussed, comfortable with the plan.   Medications  metoprolol tartrate (LOPRESSOR) tablet 12.5 mg (12.5 mg Oral Given 05/07/16 1456)  labetalol (NORMODYNE,TRANDATE) injection 5 mg (5 mg Intravenous Given 05/07/16 1517)  labetalol (NORMODYNE,TRANDATE) injection 10 mg (10 mg Intravenous Given 05/07/16 1556)  metoprolol tartrate (LOPRESSOR) tablet 12.5 mg (12.5 mg Oral Given 05/07/16 1748)  ALPRAZolam (XANAX) tablet 0.25 mg (0.25 mg Oral Given 05/07/16 1747)    Filed Vitals:   05/07/16 1630 05/07/16 1638 05/07/16 1715 05/07/16 1730  BP: 220/86 220/85 217/84 189/85  Pulse: 58 72 61 71  Temp:      TempSrc:      Resp:  18    Height:      Weight:      SpO2: 98% 99% 100% 97%    Final diagnoses:  Constipation, unspecified constipation type  Dizziness  Hyponatremia  Essential hypertension       Elnora Morrison, MD 05/09/16 [phone]

## 2016-05-07 NOTE — ED Notes (Signed)
Pt states he has not had a bowel movement in four days. States he took a big swig of MOM this morning. States his bowels usually move ever other day

## 2016-05-07 NOTE — ED Notes (Signed)
Pt states he felt dizzy this morning so he took his meclizine

## 2016-09-22 ENCOUNTER — Ambulatory Visit: Payer: Medicare Other | Admitting: Gastroenterology

## 2016-09-23 ENCOUNTER — Ambulatory Visit: Payer: Medicare Other | Admitting: Gastroenterology

## 2016-09-23 ENCOUNTER — Ambulatory Visit (INDEPENDENT_AMBULATORY_CARE_PROVIDER_SITE_OTHER): Payer: Medicare Other | Admitting: Gastroenterology

## 2016-09-23 ENCOUNTER — Encounter: Payer: Self-pay | Admitting: Gastroenterology

## 2016-09-23 VITALS — BP 142/66 | HR 68 | Temp 97.9°F | Ht 69.0 in | Wt 149.2 lb

## 2016-09-23 DIAGNOSIS — R1310 Dysphagia, unspecified: Secondary | ICD-10-CM | POA: Diagnosis not present

## 2016-09-23 DIAGNOSIS — K219 Gastro-esophageal reflux disease without esophagitis: Secondary | ICD-10-CM

## 2016-09-23 DIAGNOSIS — I6521 Occlusion and stenosis of right carotid artery: Secondary | ICD-10-CM | POA: Diagnosis not present

## 2016-09-23 DIAGNOSIS — R1319 Other dysphagia: Secondary | ICD-10-CM | POA: Diagnosis not present

## 2016-09-23 NOTE — Progress Notes (Signed)
Primary Care Physician:  Philis Fendt, MD  Primary Gastroenterologist:  Garfield Cornea, MD   Chief Complaint  Patient presents with  . Weight Loss    HPI:  [name] is a 82 y.o. male here to be evaluation for some throat/swallowing concerns and for h/o polyps. Patient has a history of esophageal biopsy with low-grade squamous dysplasia with previous EUS and biopsy by Dr. Jerene Pitch in 2006. I don't have access to those records at this time.   He presents stating it's been too long since his last upper endoscopy and colonoscopy. He states he feels a lump deep in his left neck, larger than the right side. He notes that when he swallows. He demonstrates by placing in his hands on the sides of his neck and when he swallows he states he has pain on the left side which feels like there is a hard knot in there. Really difficult to get him to say that he has true esophageal dysphagia. Complains of some vague symptoms but no history of near food impactions. Denies heartburn. He does complain of odynophagia however. Denies abdominal pain. No complaints of bowel issues. No blood in the stool or melena. He said "I had 2 polyps before therefore need another colonoscopy".   He does not come with her medication list today. His medications come via mail order.   PATIENT BROUGHT MEDICATION LIST. UPDATED IN NOTE.  Current Outpatient Prescriptions  Medication Sig Dispense Refill  . albuterol (PROVENTIL HFA;VENTOLIN HFA) 108 (90 Base) MCG/ACT inhaler Inhale 2 puffs into the lungs every 6 (six) hours as needed for wheezing or shortness of breath.    . alfuzosin (UROXATRAL) 10 MG 24 hr tablet Take 10 mg by mouth daily.      Marland Kitchen ALPRAZolam (XANAX) 0.25 MG tablet Take 0.25 mg by mouth 2 (two) times daily as needed for sleep or anxiety.     Marland Kitchen aspirin 325 MG EC tablet Take 325 mg by mouth daily.    Marland Kitchen atorvastatin (LIPITOR) 20 MG tablet Take 20 mg by mouth daily.      . cetirizine (ZYRTEC) 10 MG tablet Take 10 mg by  mouth daily.    . clopidogrel (PLAVIX) 75 MG tablet Take 75 mg by mouth daily.      Marland Kitchen dexlansoprazole (DEXILANT) 60 MG capsule Take 60 mg by mouth daily.    . diclofenac sodium (VOLTAREN) 1 % GEL Apply 4 g topically 4 (four) times daily.    Marland Kitchen esomeprazole (NEXIUM) 20 MG capsule Take 20 mg by mouth daily at 12 noon.    . ferrous sulfate 325 (65 FE) MG tablet Take 325 mg by mouth daily with breakfast.    . meclizine (ANTIVERT) 25 MG tablet Take 25 mg by mouth every 6 (six) hours as needed for dizziness.    . metoprolol succinate (TOPROL-XL) 25 MG 24 hr tablet Take 25 mg by mouth daily.     . Multiple Vitamin (MULTIVITAMIN PO) Take 1 tablet by mouth daily.     . Multiple Vitamins-Minerals (ICAPS MV PO) Take 1 capsule by mouth at bedtime.     . pregabalin (LYRICA) 50 MG capsule Take 50 mg by mouth at bedtime.    . Psyllium (METAMUCIL PO) Take 1 scoop by mouth daily.    . sertraline (ZOLOFT) 25 MG tablet Take 25 mg by mouth daily.    . traMADol (ULTRAM) 50 MG tablet Take 50 mg by mouth 2 (two) times daily as needed.     Marland Kitchen  triamcinolone (NASACORT AQ) 55 MCG/ACT AERO nasal inhaler Place 2 sprays into the nose daily.     No current facility-administered medications for this visit.     Allergies as of 09/23/2016 - Review Complete 09/23/2016  Allergen Reaction Noted  . Hydrocodone Anaphylaxis and Nausea And Vomiting   . Oxycodone Nausea And Vomiting 12/12/2012    Past Medical History:  Diagnosis Date  . AAA (abdominal aortic aneurysm) ([place])   . Anemia   . Anemia   . Arthritis   . Arthritis   . CAD (coronary artery disease)   . Cancer ([place]) 2016   skin caner: Forehead  . GERD (gastroesophageal reflux disease)   . Hiatal hernia   . Hyperlipidemia   . Hypertension   . Macular degeneration   . Peripheral vascular disease ([place])   . Trouble swallowing     Past Surgical History:  Procedure Laterality Date  . ANGIOPLASTY / STENTING ILIAC  03-2011   Right Iliac - Gore Stent graft   . BACK  SURGERY  09-05-2011    . COLONOSCOPY  08/2009   Rourk: report unavailable, two tubular adenomas and acute ileitis on path  . CORONARY ANGIOPLASTY WITH STENT PLACEMENT    . ESOPHAGOGASTRODUODENOSCOPY  2008   Rourk: Critical Schazki ring s/p dilation, hiatal hernia  . ESOPHAGOGASTRODUODENOSCOPY  09/2005   Rourk: S-shaped distal esophageal erosion, middle to distal esopahgus s/p multiple biopsies (unavailable)  . ESOPHAGOGASTRODUODENOSCOPY  06/2005   Rourk: solitary mid esophageal lesion s/p bx (low grade dysplasia), prominent schatzki ring, large hh  . ESOPHAGOGASTRODUODENOSCOPY  11/2009   Rourk: Schatzki ring s/p dilation  . EUS     [facility]ishra:  . SPINE SURGERY    . TONSILLECTOMY      Family History  Problem Relation Age of Onset  . Heart failure Mother   . Diabetes Mother   . Heart disease Mother   . Pneumonia Father   . Cancer Brother   . Diabetes Brother   . Heart disease Brother     Social History   Social History  . Marital status: Widowed    Spouse name: N/A  . Number of children: N/A  . Years of education: N/A   Occupational History  . Not on file.   Social History Main Topics  . Smoking status: Former Smoker    Types: Cigarettes    Quit date: 12/08/1990  . Smokeless tobacco: Former Systems developer    Types: Chew    Quit date: 09/13/2014  . Alcohol use No  . Drug use: No  . Sexual activity: Not on file   Other Topics Concern  . Not on file   Social History Narrative  . No narrative on file      ROS:  General: Negative for anorexia, weight loss, fever, chills, fatigue, weakness. Eyes: Negative for vision changes.  ENT: Negative for hoarseness,  nasal congestion.Hard of hearing. See hpi. CV: Negative for chest pain, angina, palpitations, dyspnea on exertion, peripheral edema.  Respiratory: Negative for dyspnea at rest, dyspnea on exertion, cough, sputum, wheezing.  GI: See history of present illness. GU:  Negative for dysuria, hematuria, urinary incontinence,  urinary frequency, nocturnal urination.  MS: Negative for joint pain, low back pain.  Derm: Negative for rash or itching.  Neuro: Negative for weakness, abnormal sensation, seizure, frequent headaches, memory loss, confusion.  Psych: Negative for anxiety, depression, suicidal ideation, hallucinations.  Endo: Negative for unusual weight change.  Heme: Negative for bruising or bleeding. Allergy: Negative for rash or  hives.    Physical Examination:  BP (!) 142/66   Pulse 68   Temp 97.9 F (36.6 C) (Oral)   Ht 5\' 9"  (1.753 m)   Wt 149 lb 3.2 oz (67.7 kg)   BMI 22.03 kg/m    General: Well-nourished, well-developed in no acute distress. Hard of hearing. Caregiver presents with patient.  Head: Normocephalic, atraumatic.   Eyes: Conjunctiva pink, no icterus. Mouth: Oropharyngeal mucosa moist and pink , no lesions erythema or exudate. Neck: Supple without thyromegaly, masses, or lymphadenopathy. Prominent cartilege Lungs: Clear to auscultation bilaterally.  Heart: Regular rate and rhythm, no murmurs rubs or gallops.  Abdomen: Bowel sounds are normal, nontender, nondistended, no hepatosplenomegaly or masses, no abdominal bruits or    hernia , no rebound or guarding.   Rectal: not performed Extremities: No lower extremity edema. No clubbing or deformities.  Neuro: Alert and oriented x 4 , grossly normal neurologically.  Skin: Warm and dry, no rash or jaundice.   Psych: Alert and cooperative, normal mood and affect.  Labs: Lab Results  Component Value Date   CREATININE 0.73 05/07/2016   BUN 19 05/07/2016   NA 131 (L) 05/07/2016   K 3.8 05/07/2016   CL 96 (L) 05/07/2016   CO2 28 05/07/2016    Lab Results  Component Value Date   WBC 4.4 05/07/2016   HGB 12.7 (L) 05/07/2016   HCT 37.9 (L) 05/07/2016   MCV 94.8 05/07/2016   PLT 133 (L) 05/07/2016     Imaging Studies: No results found.

## 2016-09-23 NOTE — Patient Instructions (Signed)
1. I will discuss your concerns with Dr. Gala Romney regarding throat/neck issues. We will have to verify medication list prior to proceeding with any sort of procedures. 2. Given your age, guidelines state that we do not need to perform any further colonoscopies just look for polyps. The benefits of the procedure are not greater than the potential risk of the procedure in this scenario. I again will discuss your concerns with Dr. Gala Romney.

## 2016-09-25 ENCOUNTER — Telehealth: Payer: Self-pay | Admitting: Internal Medicine

## 2016-09-25 NOTE — Telephone Encounter (Signed)
noted 

## 2016-09-25 NOTE — Assessment & Plan Note (Addendum)
82 y/o male with h/o esophageal lesion with low-grade squamous dysplasia in 2006, subsequent EGD 2008/2010 unremarkable for dysplasia, h/o tubular adenomas of the colon who presents with concern for need to have additional EGD/TCS. Patient complains of lump/knot on the right neck, odynophagia but no real solid food dysphagia. Typical gerd controlled. No bowel concerns. Discussed case with Dr. Gala Romney. Recommended initial evaluation with barium pill esophagram if patient agreeable. Given patient's age, no clear indication for colonoscopy at this time.

## 2016-09-25 NOTE — Telephone Encounter (Signed)
Pt came to front window to say that anything we may order for him to have done to please mail him a letter instead of calling him. Patient is VERY hard of hearing. He asked to mail letter at least 2-3 days in advance so he can make arrangements for his daughter to take him where he needed to go.

## 2016-09-26 NOTE — Addendum Note (Signed)
Addended by: Mahala Menghini on: 09/26/2016 12:54 PM   Modules accepted: Orders

## 2016-09-26 NOTE — Progress Notes (Addendum)
Please let patient know that I reviewed his medication list. I also discussed patient's concerns regarding his throat, painful swallowing, desires to have an upper endoscopy and colonoscopy. Patient has a history of tubular adenomas removed from the colon in 2010. History of remote esophageal lesion with dysplasia but subsequent endoscopies unremarkable.  Dr. Gala Romney recommends barium pill esophagram initially. Further recommendations to follow depending on those results. Please schedule.

## 2016-09-29 NOTE — Progress Notes (Signed)
CC'D TO PCP °

## 2016-09-29 NOTE — Progress Notes (Signed)
Pt is set up for 10/02/16 @ 9:00 he is aware

## 2016-10-02 ENCOUNTER — Ambulatory Visit (HOSPITAL_COMMUNITY): Payer: Medicare Other

## 2016-10-02 ENCOUNTER — Ambulatory Visit (HOSPITAL_COMMUNITY)
Admission: RE | Admit: 2016-10-02 | Discharge: 2016-10-02 | Disposition: A | Discharge status: Home / Self Care | Payer: Medicare Other | Source: Ambulatory Visit | Attending: Gastroenterology | Admitting: Gastroenterology

## 2016-10-02 DIAGNOSIS — R1310 Dysphagia, unspecified: Secondary | ICD-10-CM | POA: Diagnosis present

## 2016-10-02 DIAGNOSIS — K219 Gastro-esophageal reflux disease without esophagitis: Secondary | ICD-10-CM | POA: Diagnosis present

## 2016-10-02 DIAGNOSIS — K2970 Gastritis, unspecified, without bleeding: Secondary | ICD-10-CM | POA: Insufficient documentation

## 2016-10-02 DIAGNOSIS — R1319 Other dysphagia: Secondary | ICD-10-CM | POA: Diagnosis present

## 2016-10-02 DIAGNOSIS — K449 Diaphragmatic hernia without obstruction or gangrene: Secondary | ICD-10-CM | POA: Insufficient documentation

## 2016-10-02 DIAGNOSIS — K224 Dyskinesia of esophagus: Secondary | ICD-10-CM | POA: Diagnosis not present

## 2016-10-15 NOTE — Progress Notes (Signed)
Please let patient now that his barium xray showed no esophageal stricture. There was full thickening of the mid to distal esophagus, full thickening in the stomach as well. Patient had difficulty swallowing barium tablet however no delay during the esophageal phase of swallowing.  Offer patient EGD+/-ED with Dr. Gala Romney in the near future. Per previous discussion with RMR, colonoscopy not indicated at this time as benefits of procedure do not outweigh the risk at this time.

## 2016-10-29 ENCOUNTER — Encounter (HOSPITAL_COMMUNITY): Payer: Self-pay

## 2016-10-29 ENCOUNTER — Emergency Department (HOSPITAL_COMMUNITY): Payer: Medicare Other

## 2016-10-29 ENCOUNTER — Inpatient Hospital Stay (HOSPITAL_COMMUNITY)
Admission: EM | Admit: 2016-10-29 | Discharge: 2016-10-30 | DRG: 193 | Disposition: A | Discharge status: Home / Self Care | Payer: Medicare Other | Attending: Internal Medicine | Admitting: Internal Medicine

## 2016-10-29 DIAGNOSIS — E876 Hypokalemia: Secondary | ICD-10-CM | POA: Diagnosis present

## 2016-10-29 DIAGNOSIS — R0902 Hypoxemia: Secondary | ICD-10-CM

## 2016-10-29 DIAGNOSIS — K210 Gastro-esophageal reflux disease with esophagitis: Secondary | ICD-10-CM | POA: Diagnosis present

## 2016-10-29 DIAGNOSIS — Z9861 Coronary angioplasty status: Secondary | ICD-10-CM

## 2016-10-29 DIAGNOSIS — H3530 Unspecified macular degeneration: Secondary | ICD-10-CM | POA: Diagnosis present

## 2016-10-29 DIAGNOSIS — Z79899 Other long term (current) drug therapy: Secondary | ICD-10-CM | POA: Diagnosis not present

## 2016-10-29 DIAGNOSIS — Z7982 Long term (current) use of aspirin: Secondary | ICD-10-CM | POA: Diagnosis not present

## 2016-10-29 DIAGNOSIS — J181 Lobar pneumonia, unspecified organism: Secondary | ICD-10-CM

## 2016-10-29 DIAGNOSIS — I2510 Atherosclerotic heart disease of native coronary artery without angina pectoris: Secondary | ICD-10-CM | POA: Diagnosis present

## 2016-10-29 DIAGNOSIS — Z95820 Peripheral vascular angioplasty status with implants and grafts: Secondary | ICD-10-CM

## 2016-10-29 DIAGNOSIS — D696 Thrombocytopenia, unspecified: Secondary | ICD-10-CM | POA: Diagnosis present

## 2016-10-29 DIAGNOSIS — Z7951 Long term (current) use of inhaled steroids: Secondary | ICD-10-CM

## 2016-10-29 DIAGNOSIS — R197 Diarrhea, unspecified: Secondary | ICD-10-CM | POA: Diagnosis present

## 2016-10-29 DIAGNOSIS — Z955 Presence of coronary angioplasty implant and graft: Secondary | ICD-10-CM

## 2016-10-29 DIAGNOSIS — Z885 Allergy status to narcotic agent status: Secondary | ICD-10-CM | POA: Diagnosis not present

## 2016-10-29 DIAGNOSIS — J189 Pneumonia, unspecified organism: Principal | ICD-10-CM | POA: Diagnosis present

## 2016-10-29 DIAGNOSIS — R112 Nausea with vomiting, unspecified: Secondary | ICD-10-CM

## 2016-10-29 DIAGNOSIS — Z7902 Long term (current) use of antithrombotics/antiplatelets: Secondary | ICD-10-CM | POA: Diagnosis not present

## 2016-10-29 DIAGNOSIS — Z87891 Personal history of nicotine dependence: Secondary | ICD-10-CM | POA: Diagnosis not present

## 2016-10-29 DIAGNOSIS — J9601 Acute respiratory failure with hypoxia: Secondary | ICD-10-CM | POA: Diagnosis present

## 2016-10-29 DIAGNOSIS — I10 Essential (primary) hypertension: Secondary | ICD-10-CM | POA: Diagnosis present

## 2016-10-29 DIAGNOSIS — E785 Hyperlipidemia, unspecified: Secondary | ICD-10-CM | POA: Diagnosis present

## 2016-10-29 DIAGNOSIS — I739 Peripheral vascular disease, unspecified: Secondary | ICD-10-CM | POA: Diagnosis present

## 2016-10-29 DIAGNOSIS — Z66 Do not resuscitate: Secondary | ICD-10-CM | POA: Diagnosis present

## 2016-10-29 DIAGNOSIS — K219 Gastro-esophageal reflux disease without esophagitis: Secondary | ICD-10-CM | POA: Diagnosis present

## 2016-10-29 LAB — CBC WITH DIFFERENTIAL/PLATELET
Basophils Absolute: 0 10*3/uL (ref 0.0–0.1)
Basophils Relative: 0 %
Eosinophils Absolute: 0 10*3/uL (ref 0.0–0.7)
Eosinophils Relative: 0 %
HCT: 39.1 % (ref 39.0–52.0)
HEMOGLOBIN: 13.1 g/dL (ref 13.0–17.0)
LYMPHS ABS: 0.2 10*3/uL — AB (ref 0.7–4.0)
LYMPHS PCT: 4 %
MCH: 31.4 pg (ref 26.0–34.0)
MCHC: 33.5 g/dL (ref 30.0–36.0)
MCV: 93.8 fL (ref 78.0–100.0)
Monocytes Absolute: 0.2 10*3/uL (ref 0.1–1.0)
Monocytes Relative: 3 %
NEUTROS PCT: 93 %
Neutro Abs: 5.3 10*3/uL (ref 1.7–7.7)
Platelets: 148 10*3/uL — ABNORMAL LOW (ref 150–400)
RBC: 4.17 MIL/uL — AB (ref 4.22–5.81)
RDW: 13.8 % (ref 11.5–15.5)
WBC: 5.7 10*3/uL (ref 4.0–10.5)

## 2016-10-29 LAB — LACTIC ACID, PLASMA
Lactic Acid, Venous: 2.8 mmol/L (ref 0.5–1.9)
Lactic Acid, Venous: 1.6 mmol/L (ref 0.5–1.9)

## 2016-10-29 LAB — COMPREHENSIVE METABOLIC PANEL
ALT: 16 U/L — AB (ref 17–63)
ANION GAP: 10 (ref 5–15)
AST: 28 U/L (ref 15–41)
Albumin: 3.9 g/dL (ref 3.5–5.0)
Alkaline Phosphatase: 56 U/L (ref 38–126)
BUN: 38 mg/dL — ABNORMAL HIGH (ref 6–20)
CHLORIDE: 100 mmol/L — AB (ref 101–111)
CO2: 26 mmol/L (ref 22–32)
Calcium: 9.4 mg/dL (ref 8.9–10.3)
Creatinine, Ser: 1.1 mg/dL (ref 0.61–1.24)
GFR calc non Af Amer: 55 mL/min — ABNORMAL LOW (ref >60)
Glucose, Bld: 166 mg/dL — ABNORMAL HIGH (ref 65–99)
Potassium: 3.4 mmol/L — ABNORMAL LOW (ref 3.5–5.1)
SODIUM: 136 mmol/L (ref 135–145)
Total Bilirubin: 0.9 mg/dL (ref 0.3–1.2)
Total Protein: 6.6 g/dL (ref 6.5–8.1)

## 2016-10-29 LAB — LIPASE, BLOOD: Lipase: 27 U/L (ref 11–51)

## 2016-10-29 MED ORDER — TRAMADOL HCL 50 MG PO TABS: 50.0000 mg | ORAL_TABLET | Freq: Four times a day (QID) | ORAL | Status: DC | PRN

## 2016-10-29 MED ORDER — SODIUM CHLORIDE 0.9 % IV BOLUS (SEPSIS)
1000.0000 mL | Freq: Once | INTRAVENOUS | Status: AC
Start: 2016-10-29 — End: 2016-10-29
  Administered 2016-10-29: 1000 mL via INTRAVENOUS

## 2016-10-29 MED ORDER — ONDANSETRON 8 MG PO TBDP
8.0000 mg | ORAL_TABLET | Freq: Once | ORAL | Status: AC
  Administered 2016-10-29: 8 mg via ORAL
  Filled 2016-10-29: qty 1

## 2016-10-29 MED ORDER — ALPRAZOLAM 0.5 MG PO TABS
0.2500 mg | ORAL_TABLET | Freq: Two times a day (BID) | ORAL | Status: DC | PRN
  Administered 2016-10-29: 0.25 mg via ORAL
  Filled 2016-10-29: qty 1

## 2016-10-29 MED ORDER — LOPERAMIDE HCL 2 MG PO CAPS
4.0000 mg | ORAL_CAPSULE | Freq: Once | ORAL | Status: AC
  Administered 2016-10-29: 4 mg via ORAL
  Filled 2016-10-29: qty 2

## 2016-10-29 MED ORDER — PANTOPRAZOLE SODIUM 40 MG PO TBEC
40.0000 mg | DELAYED_RELEASE_TABLET | Freq: Every day | ORAL | Status: DC
  Administered 2016-10-29 – 2016-10-30 (×2): 40 mg via ORAL
  Filled 2016-10-29 (×2): qty 1

## 2016-10-29 MED ORDER — METOPROLOL SUCCINATE ER 25 MG PO TB24
25.0000 mg | ORAL_TABLET | Freq: Every day | ORAL | Status: DC
  Administered 2016-10-29 – 2016-10-30 (×2): 25 mg via ORAL
  Filled 2016-10-29 (×2): qty 1

## 2016-10-29 MED ORDER — FLUTICASONE PROPIONATE 50 MCG/ACT NA SUSP
2.0000 | Freq: Every day | NASAL | Status: DC
  Administered 2016-10-29 – 2016-10-30 (×2): 2 via NASAL
  Filled 2016-10-29: qty 16

## 2016-10-29 MED ORDER — ORAL CARE MOUTH RINSE
15.0000 mL | Freq: Two times a day (BID) | OROMUCOSAL | Status: DC
  Administered 2016-10-29 – 2016-10-30 (×2): 15 mL via OROMUCOSAL

## 2016-10-29 MED ORDER — POTASSIUM CHLORIDE IN NACL 20-0.9 MEQ/L-% IV SOLN
INTRAVENOUS | Status: AC
  Administered 2016-10-29: 05:00:00 via INTRAVENOUS

## 2016-10-29 MED ORDER — TRIAMCINOLONE ACETONIDE 55 MCG/ACT NA AERO
2.0000 | INHALATION_SPRAY | Freq: Every day | NASAL | Status: DC
  Filled 2016-10-29: qty 21.6

## 2016-10-29 MED ORDER — CLOPIDOGREL BISULFATE 75 MG PO TABS
75.0000 mg | ORAL_TABLET | Freq: Every day | ORAL | Status: DC
  Administered 2016-10-29 – 2016-10-30 (×2): 75 mg via ORAL
  Filled 2016-10-29 (×2): qty 1

## 2016-10-29 MED ORDER — DEXTROSE 5 % IV SOLN
1.0000 g | INTRAVENOUS | Status: DC
  Administered 2016-10-30: 1 g via INTRAVENOUS
  Filled 2016-10-29 (×2): qty 10

## 2016-10-29 MED ORDER — ALBUTEROL SULFATE (2.5 MG/3ML) 0.083% IN NEBU: 2.5000 mg | INHALATION_SOLUTION | Freq: Four times a day (QID) | RESPIRATORY_TRACT | Status: DC | PRN

## 2016-10-29 MED ORDER — LORATADINE 10 MG PO TABS
10.0000 mg | ORAL_TABLET | Freq: Every day | ORAL | Status: DC
  Administered 2016-10-29 – 2016-10-30 (×2): 10 mg via ORAL
  Filled 2016-10-29 (×2): qty 1

## 2016-10-29 MED ORDER — ATORVASTATIN CALCIUM 20 MG PO TABS
20.0000 mg | ORAL_TABLET | Freq: Every day | ORAL | Status: DC
  Administered 2016-10-29 – 2016-10-30 (×2): 20 mg via ORAL
  Filled 2016-10-29 (×2): qty 1

## 2016-10-29 MED ORDER — AZITHROMYCIN 500 MG IV SOLR
500.0000 mg | INTRAVENOUS | Status: DC
  Administered 2016-10-29 – 2016-10-30 (×2): 500 mg via INTRAVENOUS
  Filled 2016-10-29 (×3): qty 500

## 2016-10-29 MED ORDER — ASPIRIN EC 325 MG PO TBEC
325.0000 mg | DELAYED_RELEASE_TABLET | Freq: Every day | ORAL | Status: DC
  Administered 2016-10-29 – 2016-10-30 (×2): 325 mg via ORAL
  Filled 2016-10-29 (×2): qty 1

## 2016-10-29 MED ORDER — SERTRALINE HCL 50 MG PO TABS
25.0000 mg | ORAL_TABLET | Freq: Every day | ORAL | Status: DC
  Administered 2016-10-29 – 2016-10-30 (×2): 25 mg via ORAL
  Filled 2016-10-29 (×2): qty 1

## 2016-10-29 MED ORDER — MECLIZINE HCL 12.5 MG PO TABS
25.0000 mg | ORAL_TABLET | Freq: Four times a day (QID) | ORAL | Status: DC | PRN
Start: 2016-10-29 — End: 2016-10-30

## 2016-10-29 MED ORDER — DEXTROSE 5 % IV SOLN
1.0000 g | Freq: Once | INTRAVENOUS | Status: AC
  Administered 2016-10-29: 1 g via INTRAVENOUS
  Filled 2016-10-29: qty 10

## 2016-10-29 MED ORDER — DICLOFENAC SODIUM 1 % TD GEL
4.0000 g | Freq: Four times a day (QID) | TRANSDERMAL | Status: DC
  Administered 2016-10-29 – 2016-10-30 (×4): 4 g via TOPICAL
  Filled 2016-10-29: qty 100

## 2016-10-29 MED ORDER — ADULT MULTIVITAMIN W/MINERALS CH
1.0000 | ORAL_TABLET | Freq: Every day | ORAL | Status: DC
  Administered 2016-10-29: 1 via ORAL
  Filled 2016-10-29: qty 1

## 2016-10-29 MED ORDER — PREGABALIN 50 MG PO CAPS
50.0000 mg | ORAL_CAPSULE | Freq: Every day | ORAL | Status: DC
  Administered 2016-10-29: 50 mg via ORAL
  Filled 2016-10-29: qty 1

## 2016-10-29 MED ORDER — CHLORHEXIDINE GLUCONATE 0.12 % MT SOLN
15.0000 mL | Freq: Two times a day (BID) | OROMUCOSAL | Status: DC
  Administered 2016-10-29 – 2016-10-30 (×3): 15 mL via OROMUCOSAL
  Filled 2016-10-29 (×3): qty 15

## 2016-10-29 MED ORDER — ENOXAPARIN SODIUM 40 MG/0.4ML ~~LOC~~ SOLN
40.0000 mg | SUBCUTANEOUS | Status: DC
  Administered 2016-10-30: 40 mg via SUBCUTANEOUS
  Filled 2016-10-29: qty 0.4

## 2016-10-29 MED ORDER — FERROUS SULFATE 325 (65 FE) MG PO TABS
325.0000 mg | ORAL_TABLET | Freq: Every day | ORAL | Status: DC
  Administered 2016-10-29 – 2016-10-30 (×2): 325 mg via ORAL
  Filled 2016-10-29 (×2): qty 1

## 2016-10-29 MED ORDER — ALFUZOSIN HCL ER 10 MG PO TB24
10.0000 mg | ORAL_TABLET | Freq: Every day | ORAL | Status: DC
  Administered 2016-10-29 – 2016-10-30 (×2): 10 mg via ORAL
  Filled 2016-10-29 (×3): qty 1

## 2016-10-29 NOTE — Progress Notes (Signed)
[name] for Renal Adjustment of ABX if needed ABX:  Rocephin and Zithromax  Allergies  Allergen Reactions  . Hydrocodone Anaphylaxis and Nausea And Vomiting  . Oxycodone Nausea And Vomiting   Patient Measurements:    Vital Signs: Temp: 99.6 F (37.6 C) (11/22 0605) Temp Source: Oral (11/22 0605) BP: 128/53 (11/22 0605) Pulse Rate: 87 (11/22 0605)  Labs:  Recent Labs  10/29/16 0300  WBC 5.7  HGB 13.1  PLT 148*  CREATININE 1.10   No results for input(s): VANCOTROUGH, VANCOPEAK, VANCORANDOM, GENTTROUGH, GENTPEAK, GENTRANDOM, TOBRATROUGH, TOBRAPEAK, TOBRARND, AMIKACINPEAK, AMIKACINTROU, AMIKACIN in the last 72 hours.   Medical History: Past Medical History:  Diagnosis Date  . AAA (abdominal aortic aneurysm) ([place])   . Anemia   . Anemia   . Arthritis   . Arthritis   . CAD (coronary artery disease)   . Cancer ([place]) 2016   skin caner: Forehead  . GERD (gastroesophageal reflux disease)   . Hiatal hernia   . Hyperlipidemia   . Hypertension   . Macular degeneration   . Peripheral vascular disease ([place])   . Trouble swallowing    Assessment: 82yo male started on Rocephin and Zithromax.  No renal adjustment needed.  CrCl cannot be calculated (Unknown ideal weight.).   Anti-infectives    Start     Dose/Rate Route Frequency Ordered Stop   10/30/16 0430  cefTRIAXone (ROCEPHIN) 1 g in dextrose 5 % 50 mL IVPB     1 g 100 mL/hr over 30 Minutes Intravenous Every 24 hours 10/29/16 0422 11/06/16 0429   10/29/16 0345  cefTRIAXone (ROCEPHIN) 1 g in dextrose 5 % 50 mL IVPB     1 g 100 mL/hr over 30 Minutes Intravenous  Once 10/29/16 0342 10/29/16 0507   10/29/16 0345  azithromycin (ZITHROMAX) 500 mg in dextrose 5 % 250 mL IVPB     500 mg 250 mL/hr over 60 Minutes Intravenous Every 24 hours 10/29/16 0342       Plan: Continue current Rx F/U to switch Zithromax to PO  Ena Dawley, [facility] 10/29/2016

## 2016-10-29 NOTE — Progress Notes (Signed)
Patient seen and examined. Database reviewed. Caregiver at bedside updated on plan of care and all questions answered. He has not had further diarrhea since admission. Caregiver states he is usually constipated and on day PTA he took 3 separate stool softeners and laxatives. Was noted to have CAP and hypoxemia. Agree with rocephin/azithromycin. Will continue to follow closely. Hope for DC home in am if continues to improve.   Domingo Mend, MD [facility] Pager: [phone]

## 2016-10-29 NOTE — ED Notes (Signed)
Informed Dr. Tomi Bamberger that the patient's O2 sats were 86-88%.  Patient placed on O2 at 2L via [place].

## 2016-10-29 NOTE — Care Management Note (Signed)
Case Management Note  Patient Details  Name: [name] MRN: [id number] Date of Birth: 1917-12-12  Subjective/Objective:                  Pt admitted CAP. She is from home, lives alone and has daughter who lives near-by. Pt has aids 8am-4pm. Pt goes to the [facility] 2 days a week and go to the [facility] 5 days a week. He has PCP, aid transports to [facility]. He uses rollator with ambulation. He does not have oxygen PTA. He refuses [facility] services. He plans to return home with resumption of previous arrangements. CM spoke with daughter over the phone who is trying to convince pt to go into ALF, he refuses.   Action/Plan: No CM needs anticipated.   Expected Discharge Date:      11/01/2016            Expected Discharge Plan:  Home/Self Care  In-House Referral:  NA  Discharge planning Services  CM Consult  Post Acute Care Choice:  NA Choice offered to:  NA  Status of Service:  In process, will continue to follow  Sherald Barge, RN 10/29/2016, 9:20 AM

## 2016-10-29 NOTE — Care Management Important Message (Signed)
Important Message  Patient Details  Name: [name] MRN: [id number] Date of Birth: Jul 25, 1918   Medicare Important Message Given:  Yes    Sherald Barge, RN 10/29/2016, 9:14 AM

## 2016-10-29 NOTE — ED Provider Notes (Signed)
[facility] DEPT Provider Note   CSN: [id number] Arrival date & time: 10/29/16  0216  Time seen 02:30 AM   History   Chief Complaint Chief Complaint  Patient presents with  . Nausea   LEVEL 5 CAVEAT DUE TO AGE  HPI [name] is a 82 y.o. male.  HPI  patient reports he's been eating at [facility] the past 5 days. He was seen earlier today when he was having some pain in his left lateral chest and under his arm. He states he had a EKG that was normal. He states acutely about 8 PM he started having diarrhea. He states he had 15-20 episodes. He had nausea and had some sour fluid come up into his throat about 3 times. He also describes some lower abdominal discomfort. He complains of feeling weak. Patient is having a lot of shaking and his daughter states "whenever he gets sick he goes all to pieces". He has not been around anybody else who is ill. We have not seen any other patients today that ate at [employer]. He denies chest pain tonight.  PCP Dr Jeanie Cooks  Past Medical History:  Diagnosis Date  . AAA (abdominal aortic aneurysm) ([place])   . Anemia   . Anemia   . Arthritis   . Arthritis   . CAD (coronary artery disease)   . Cancer ([place]) 2016   skin caner: Forehead  . GERD (gastroesophageal reflux disease)   . Hiatal hernia   . Hyperlipidemia   . Hypertension   . Macular degeneration   . Peripheral vascular disease ([place])   . Trouble swallowing     Patient Active Problem List   Diagnosis Date Noted  . Acute respiratory failure with hypoxia ([place]) 10/29/2016  . Diarrhea 10/29/2016  . CAP (community acquired pneumonia) 10/29/2016  . Odynophagia 09/23/2016  . Community acquired pneumonia 09/07/2014  . Sepsis ([place]) 09/07/2014  . Acute encephalopathy 09/07/2014  . Thrombocytopenia ([place]) 09/07/2014  . Carotid stenosis 07/14/2014  . External hemorrhoid 02/28/2013  . Weakness generalized 02/28/2013  . Hyponatremia 02/18/2013  . Abdominal aortic aneurysm ([place]) 10/12/2012    . DYSPHAGIA 11/15/2009  . CONSTIPATION, CHRONIC 08/10/2009  . HEMATOCHEZIA 08/10/2009  . HYPERCHOLESTEROLEMIA 08/09/2009  . MACULAR DEGENERATION 08/09/2009  . Essential hypertension 08/09/2009  . CAD S/P RCA stent '06 08/09/2009  . TIA 08/09/2009  . GERD 08/09/2009  . ARTHRITIS 08/09/2009  . BENIGN PROSTATIC HYPERTROPHY, HX OF 08/09/2009    Past Surgical History:  Procedure Laterality Date  . ANGIOPLASTY / STENTING ILIAC  03-2011   Right Iliac - Gore Stent graft   . BACK SURGERY  09-05-2011    . COLONOSCOPY  08/2009   Rourk: report unavailable, two tubular adenomas and acute ileitis on path  . CORONARY ANGIOPLASTY WITH STENT PLACEMENT    . ESOPHAGOGASTRODUODENOSCOPY  2008   Rourk: Critical Schazki ring s/p dilation, hiatal hernia  . ESOPHAGOGASTRODUODENOSCOPY  09/2005   Rourk: S-shaped distal esophageal erosion, middle to distal esopahgus s/p multiple biopsies (unavailable)  . ESOPHAGOGASTRODUODENOSCOPY  06/2005   Rourk: solitary mid esophageal lesion s/p bx (low grade dysplasia), prominent schatzki ring, large hh  . ESOPHAGOGASTRODUODENOSCOPY  11/2009   Rourk: Schatzki ring s/p dilation  . EUS     Mishra:  . SPINE SURGERY    . TONSILLECTOMY         Home Medications    Prior to Admission medications   Medication Sig Start Date End Date Taking? Authorizing Provider  albuterol (PROVENTIL HFA;VENTOLIN HFA) 108 (90  Base) MCG/ACT inhaler Inhale 2 puffs into the lungs every 6 (six) hours as needed for wheezing or shortness of breath.    Historical Provider, MD  alfuzosin (UROXATRAL) 10 MG 24 hr tablet Take 10 mg by mouth daily.      Historical Provider, MD  ALPRAZolam Duanne Moron) 0.25 MG tablet Take 0.25 mg by mouth 2 (two) times daily as needed for sleep or anxiety.     Historical Provider, MD  aspirin 325 MG EC tablet Take 325 mg by mouth daily.    Historical Provider, MD  atorvastatin (LIPITOR) 20 MG tablet Take 20 mg by mouth daily.      Historical Provider, MD  cetirizine  (ZYRTEC) 10 MG tablet Take 10 mg by mouth daily.    Historical Provider, MD  clopidogrel (PLAVIX) 75 MG tablet Take 75 mg by mouth daily.      Historical Provider, MD  dexlansoprazole (DEXILANT) 60 MG capsule Take 60 mg by mouth daily.    Historical Provider, MD  diclofenac sodium (VOLTAREN) 1 % GEL Apply 4 g topically 4 (four) times daily.    Historical Provider, MD  esomeprazole (NEXIUM) 20 MG capsule Take 20 mg by mouth daily at 12 noon.    Historical Provider, MD  ferrous sulfate 325 (65 FE) MG tablet Take 325 mg by mouth daily with breakfast.    Historical Provider, MD  meclizine (ANTIVERT) 25 MG tablet Take 25 mg by mouth every 6 (six) hours as needed for dizziness.    Historical Provider, MD  metoprolol succinate (TOPROL-XL) 25 MG 24 hr tablet Take 25 mg by mouth daily.  11/21/13   Historical Provider, MD  Multiple Vitamin (MULTIVITAMIN PO) Take 1 tablet by mouth daily.     Historical Provider, MD  Multiple Vitamins-Minerals (ICAPS MV PO) Take 1 capsule by mouth at bedtime.     Historical Provider, MD  pregabalin (LYRICA) 50 MG capsule Take 50 mg by mouth at bedtime.    Historical Provider, MD  Psyllium (METAMUCIL PO) Take 1 scoop by mouth daily.    Historical Provider, MD  sertraline (ZOLOFT) 25 MG tablet Take 25 mg by mouth daily.    Historical Provider, MD  traMADol (ULTRAM) 50 MG tablet Take 50 mg by mouth 2 (two) times daily as needed.     Historical Provider, MD  triamcinolone (NASACORT AQ) 55 MCG/ACT AERO nasal inhaler Place 2 sprays into the nose daily.    Historical Provider, MD    Family History Family History  Problem Relation Age of Onset  . Heart failure Mother   . Diabetes Mother   . Heart disease Mother   . Pneumonia Father   . Cancer Brother   . Diabetes Brother   . Heart disease Brother     Social History Social History  Substance Use Topics  . Smoking status: Former Smoker    Types: Cigarettes    Quit date: 12/08/1990  . Smokeless tobacco: Former Systems developer     Types: Chew    Quit date: 09/13/2014  . Alcohol use No  lives at home  Lives alone Uses a walker   Allergies   Hydrocodone and Oxycodone   Review of Systems Review of Systems  All other systems reviewed and are negative.    Physical Exam Updated Vital Signs BP 156/64 (BP Location: Left Arm)   Pulse 81   Temp 98.1 F (36.7 C) (Oral)   Resp 22   SpO2 90%   Vital signs normal except for borderline hypoxia  Physical Exam  Constitutional: He is oriented to person, place, and time. He appears well-developed and well-nourished.  Non-toxic appearance. He does not appear ill. No distress.  HENT:  Head: Normocephalic and atraumatic.  Right Ear: External ear normal.  Left Ear: External ear normal.  Nose: Nose normal. No mucosal edema or rhinorrhea.  Mouth/Throat: Oropharynx is clear and moist and mucous membranes are normal. No dental abscesses or uvula swelling.  Very hard of hearing. Tongue dry  Eyes: Conjunctivae and EOM are normal. Pupils are equal, round, and reactive to light.  Neck: Normal range of motion and full passive range of motion without pain. Neck supple.  Cardiovascular: Normal rate, regular rhythm and normal heart sounds.  Exam reveals no gallop and no friction rub.   No murmur heard. Pulmonary/Chest: Effort normal and breath sounds normal. No respiratory distress. He has no wheezes. He has no rhonchi. He has no rales. He exhibits no tenderness and no crepitus.  Abdominal: Soft. Normal appearance and bowel sounds are normal. He exhibits no distension. There is no tenderness. There is no rebound and no guarding.    Mildly tender in the lower abdomen  Musculoskeletal: Normal range of motion. He exhibits no edema or tenderness.  Moves all extremities well.   Neurological: He is alert and oriented to person, place, and time. He has normal strength. No cranial nerve deficit.  Some shaking of his arms  Skin: Skin is warm, dry and intact. No rash noted. No  erythema. No pallor.  Psychiatric: He has a normal mood and affect. His speech is normal and behavior is normal. His mood appears not anxious.  Nursing note and vitals reviewed.    ED Treatments / Results  Labs (all labs ordered are listed, but only abnormal results are displayed) Results for orders placed or performed during the hospital encounter of 10/29/16  Comprehensive metabolic panel  Result Value Ref Range   Sodium 136 135 - 145 mmol/L   Potassium 3.4 (L) 3.5 - 5.1 mmol/L   Chloride 100 (L) 101 - 111 mmol/L   CO2 26 22 - 32 mmol/L   Glucose, Bld 166 (H) 65 - 99 mg/dL   BUN 38 (H) 6 - 20 mg/dL   Creatinine, Ser 1.10 0.61 - 1.24 mg/dL   Calcium 9.4 8.9 - 10.3 mg/dL   Total Protein 6.6 6.5 - 8.1 g/dL   Albumin 3.9 3.5 - 5.0 g/dL   AST 28 15 - 41 U/L   ALT 16 (L) 17 - 63 U/L   Alkaline Phosphatase 56 38 - 126 U/L   Total Bilirubin 0.9 0.3 - 1.2 mg/dL   GFR calc non Af Amer 55 (L) >60 mL/min   GFR calc Af Amer >60 >60 mL/min   Anion gap 10 5 - 15  Lipase, blood  Result Value Ref Range   Lipase 27 11 - 51 U/L  CBC with Differential  Result Value Ref Range   WBC 5.7 4.0 - 10.5 K/uL   RBC 4.17 (L) 4.22 - 5.81 MIL/uL   Hemoglobin 13.1 13.0 - 17.0 g/dL   HCT 39.1 39.0 - 52.0 %   MCV 93.8 78.0 - 100.0 fL   MCH 31.4 26.0 - 34.0 pg   MCHC 33.5 30.0 - 36.0 g/dL   RDW 13.8 11.5 - 15.5 %   Platelets 148 (L) 150 - 400 K/uL   Neutrophils Relative % 93 %   Neutro Abs 5.3 1.7 - 7.7 K/uL   Lymphocytes Relative 4 %  Lymphs Abs 0.2 (L) 0.7 - 4.0 K/uL   Monocytes Relative 3 %   Monocytes Absolute 0.2 0.1 - 1.0 K/uL   Eosinophils Relative 0 %   Eosinophils Absolute 0.0 0.0 - 0.7 K/uL   Basophils Relative 0 %   Basophils Absolute 0.0 0.0 - 0.1 K/uL   Laboratory interpretation all normal except Elevated BUN consistent with dehydration, hyperglycemia    EKG  EKG Interpretation  Date/Time:  Wednesday October 29 2016 02:30:31 EST Ventricular Rate:  79 PR Interval:    QRS  Duration: 105 QT Interval:  420 QTC Calculation: 482 R Axis:   -17 Text Interpretation:  Sinus rhythm Borderline left axis deviation Borderline repolarization abnormality Borderline prolonged QT interval Baseline wander Electrode noise Since last tracing 06 Sep 2014 ST-t wave abnormality NO LONGER PRESENT Confirmed by [place]  MD-I, Nasirah Sachs ([id number]) on 10/29/2016 3:01:57 AM       Radiology Dg Chest Port 1 View  Result Date: 10/29/2016 CLINICAL DATA:  Chest pain earlier today. Low oxygen level. Patient started feeling sick to the stomach after eating at [employer] last evening. Diarrhea. EXAM: PORTABLE CHEST 1 VIEW COMPARISON:  11/01/2014 FINDINGS: Borderline heart size with normal pulmonary vascularity. There is infiltration throughout the right lung most prominent in the right lower lobe. This may be due to pneumonia or asymmetrical edema. The left lung is generally clear. No pneumothorax. No blunting of costophrenic angles. Calcified and tortuous aorta. IMPRESSION: Infiltration in the right lung, most prominent in the right lung base, suggesting pneumonia or asymmetric edema. Electronically Signed   By: Lucienne Capers M.D.   On: 10/29/2016 03:45    Dg Esophagus  Result Date: 10/02/2016 CLINICAL DATA:  Patient has a sensation of knot in throat area. Hx of hiatal hernia, hypertension, tonsillectomy, EGD x 4, and trouble swallowing. Marland Kitchen IMPRESSION: 1. Findings are consistent with esophagitis of the mid to distal esophagus as well as gastritis of the visualized upper stomach. 2. Small hiatal hernia. 3. No significant esophageal stricture.  No esophageal mass. 4. Esophageal dysmotility with significant tertiary contractions. 5. Patient demonstrated a significant delay in the oral phase of swallowing with the 13 mm barium tablet. No significant hold up to passage of the barium tablet was seen during the esophageal phase of swallowing. Electronically Signed   By: Lajean Manes M.D.   On: 10/02/2016 10:47     Procedures Procedures (including critical care time)  Medications Ordered in ED Medications  loperamide (IMODIUM) capsule 4 mg (not administered)  cefTRIAXone (ROCEPHIN) 1 g in dextrose 5 % 50 mL IVPB (not administered)  azithromycin (ZITHROMAX) 500 mg in dextrose 5 % 250 mL IVPB (not administered)  ondansetron (ZOFRAN-ODT) disintegrating tablet 8 mg (8 mg Oral Given 10/29/16 0255)  sodium chloride 0.9 % bolus 1,000 mL (1,000 mLs Intravenous New Bag/Given 10/29/16 0311)     Initial Impression / Assessment and Plan / ED Course  I have reviewed the triage vital signs and the nursing notes.  Pertinent labs & imaging results that were available during my care of the patient were reviewed by me and considered in my medical decision making (see chart for details).  Clinical Course    Patient was given Zofran ODT. He had an IV inserted and he was given 1 L of IV fluids due to the amount of diarrhea he said he had had. He was also given Imodium by mouth.  Pt noted to be hypoxic down to 86% on RA and was placed on oxygen, 2 lpm  [place] it improved to his baseline of 96%.  I reviewed patient's chest x-ray and appears he has a infiltrate in his right lower lung. Blood cultures were ordered, rectal temp was ordered. Patient was started on community-acquired pneumonia antibiotics. My thought with diarrhea and possible pneumonia would be legionnaire's disease however at this time of year there would not be any air conditioning units being used, he has not had any recent traveling or staying in a hotel. When I go back and talk to the patient he has not had coughing. He denies choking tonight when he was vomiting. He states the chest pain he had earlier today had been on the left side.  04:00 AM Dr. Myna Hidalgo, admit to med-surg  Final Clinical Impressions(s) / ED Diagnoses   Final diagnoses:  Community acquired pneumonia of right lower lobe of lung ([place])  Diarrhea, unspecified type  Nausea and vomiting,  intractability of vomiting not specified, unspecified vomiting type  Hypoxic    Plan admission  Rolland Porter, MD, Barbette Or, MD 10/29/16 [phone]

## 2016-10-29 NOTE — ED Triage Notes (Signed)
Pt states he started feeling sick to his stomach after eating [employer] last evening.  Pt states he has not vomited but has had some diarrhea

## 2016-10-29 NOTE — ED Notes (Signed)
CRITICAL VALUE ALERT  Critical value received:  Lactic acid 2.8  Date of notification:  10/29/2016  Time of notification:  [id number]  Critical value read back:Yes.    Nurse who received alert:  Fabio Neighbors RN  MD notified (1st page):  Dr. Myna Hidalgo   Time of first page:  [phone]  MD notified (2nd page):  Time of second page:  Responding MD:    Time MD responded:

## 2016-10-29 NOTE — H&P (Signed)
History and Physical    [name] [id number] DOB: 1918/03/13 DOA: 10/29/2016  PCP: Philis Fendt, MD   Patient coming from: Home  Chief Complaint: Diarrhea, malaise  HPI: [name] is a 82 y.o. male with medical history significant for coronary artery disease with stent, GERD, and hypertension who presents to the emergency department with diarrhea and malaise which began the night prior. Patient lives alone and reports doing well until developing some mild nausea and profuse watery diarrhea after dinner. This persisted throughout the night and he eventually called his daughter and was transported to the hospital for evaluation. Patient notes some vague fleeting discomfort in his chest earlier in the day, but denies any active chest pain or palpitations. He has had a cough, but denies significant dyspnea. He endorses multiple episodes of watery diarrhea in the last several hours but denies significant abdominal pain, melena, or hematochezia. There is been some nausea, but no vomiting reported. Patient denies sick contacts or recent long distance travel. He denies fevers or chills. He has not been on antibiotics or hospitalized recently.  ED Course: Upon arrival to the ED, patient is found to be afebrile, saturating only 86% on room air, tachypneic in the high 20s, and with stable heart rate and blood pressure. EKG features a sinus rhythm with borderline repolarization abnormality and chest x-ray is notable for a right-sided infiltrate. Chemistry panel features a potassium of 3.4 and BUN of 38 with serum creatinine 1.1. CBC is notable only for a stable chronic thrombocytopenia with platelets of 148,000. Blood cultures were obtained and the patient was given 1 L of normal saline, Imodium, Zofran, and treated with empiric azithromycin and Rocephin in the ED. He has remained hemodynamically stable and is saturating adequately on 2 L/m of supplemental oxygen. He will be admitted to the  medical-surgical unit for ongoing evaluation and management of pneumonia and acute watery diarrhea.  Review of Systems:  All other systems reviewed and apart from HPI, are negative.  Past Medical History:  Diagnosis Date  . AAA (abdominal aortic aneurysm) ([place])   . Anemia   . Anemia   . Arthritis   . Arthritis   . CAD (coronary artery disease)   . Cancer ([place]) 2016   skin caner: Forehead  . GERD (gastroesophageal reflux disease)   . Hiatal hernia   . Hyperlipidemia   . Hypertension   . Macular degeneration   . Peripheral vascular disease ([place])   . Trouble swallowing     Past Surgical History:  Procedure Laterality Date  . ANGIOPLASTY / STENTING ILIAC  03-2011   Right Iliac - Gore Stent graft   . BACK SURGERY  09-05-2011    . COLONOSCOPY  08/2009   Rourk: report unavailable, two tubular adenomas and acute ileitis on path  . CORONARY ANGIOPLASTY WITH STENT PLACEMENT    . ESOPHAGOGASTRODUODENOSCOPY  2008   Rourk: Critical Schazki ring s/p dilation, hiatal hernia  . ESOPHAGOGASTRODUODENOSCOPY  09/2005   Rourk: S-shaped distal esophageal erosion, middle to distal esopahgus s/p multiple biopsies (unavailable)  . ESOPHAGOGASTRODUODENOSCOPY  06/2005   Rourk: solitary mid esophageal lesion s/p bx (low grade dysplasia), prominent schatzki ring, large hh  . ESOPHAGOGASTRODUODENOSCOPY  11/2009   Rourk: Schatzki ring s/p dilation  . EUS     [facility]:  . SPINE SURGERY    . TONSILLECTOMY       reports that he quit smoking about 25 years ago. His smoking use included Cigarettes. He quit smokeless tobacco  use about 2 years ago. His smokeless tobacco use included Chew. He reports that he does not drink alcohol or use drugs.  Allergies  Allergen Reactions  . Hydrocodone Anaphylaxis and Nausea And Vomiting  . Oxycodone Nausea And Vomiting    Family History  Problem Relation Age of Onset  . Heart failure Mother   . Diabetes Mother   . Heart disease Mother   . Pneumonia Father   .  Cancer Brother   . Diabetes Brother   . Heart disease Brother      Prior to Admission medications   Medication Sig Start Date End Date Taking? Authorizing Provider  albuterol (PROVENTIL HFA;VENTOLIN HFA) 108 (90 Base) MCG/ACT inhaler Inhale 2 puffs into the lungs every 6 (six) hours as needed for wheezing or shortness of breath.    Historical Provider, MD  alfuzosin (UROXATRAL) 10 MG 24 hr tablet Take 10 mg by mouth daily.      Historical Provider, MD  ALPRAZolam Duanne Moron) 0.25 MG tablet Take 0.25 mg by mouth 2 (two) times daily as needed for sleep or anxiety.     Historical Provider, MD  aspirin 325 MG EC tablet Take 325 mg by mouth daily.    Historical Provider, MD  atorvastatin (LIPITOR) 20 MG tablet Take 20 mg by mouth daily.      Historical Provider, MD  cetirizine (ZYRTEC) 10 MG tablet Take 10 mg by mouth daily.    Historical Provider, MD  clopidogrel (PLAVIX) 75 MG tablet Take 75 mg by mouth daily.      Historical Provider, MD  dexlansoprazole (DEXILANT) 60 MG capsule Take 60 mg by mouth daily.    Historical Provider, MD  diclofenac sodium (VOLTAREN) 1 % GEL Apply 4 g topically 4 (four) times daily.    Historical Provider, MD  esomeprazole (NEXIUM) 20 MG capsule Take 20 mg by mouth daily at 12 noon.    Historical Provider, MD  ferrous sulfate 325 (65 FE) MG tablet Take 325 mg by mouth daily with breakfast.    Historical Provider, MD  meclizine (ANTIVERT) 25 MG tablet Take 25 mg by mouth every 6 (six) hours as needed for dizziness.    Historical Provider, MD  metoprolol succinate (TOPROL-XL) 25 MG 24 hr tablet Take 25 mg by mouth daily.  11/21/13   Historical Provider, MD  Multiple Vitamin (MULTIVITAMIN PO) Take 1 tablet by mouth daily.     Historical Provider, MD  Multiple Vitamins-Minerals (ICAPS MV PO) Take 1 capsule by mouth at bedtime.     Historical Provider, MD  pregabalin (LYRICA) 50 MG capsule Take 50 mg by mouth at bedtime.    Historical Provider, MD  Psyllium (METAMUCIL PO)  Take 1 scoop by mouth daily.    Historical Provider, MD  sertraline (ZOLOFT) 25 MG tablet Take 25 mg by mouth daily.    Historical Provider, MD  traMADol (ULTRAM) 50 MG tablet Take 50 mg by mouth 2 (two) times daily as needed.     Historical Provider, MD  triamcinolone (NASACORT AQ) 55 MCG/ACT AERO nasal inhaler Place 2 sprays into the nose daily.    Historical Provider, MD    Physical Exam: Vitals:   10/29/16 0229 10/29/16 0231 10/29/16 0300 10/29/16 0409  BP:  156/64 (!) 129/49   Pulse: 81  70   Resp: 22  (!) 27   Temp: 98.1 F (36.7 C)   101 F (38.3 C)  TempSrc: Oral   Rectal  SpO2:  90% (!) 86%  Constitutional: Mild tachypnea, calm, comfortable Eyes: PERTLA, lids and conjunctivae normal ENMT: Mucous membranes are moist. Posterior pharynx clear of any exudate or lesions.   Neck: normal, supple, no masses, no thyromegaly Respiratory: Rhonchi at right base and mid-lung. No crackles or wheeze. No accessory muscle use.  Cardiovascular: S1 & S2 heard, regular rate and rhythm, grade 3 systolic murmurs at LSB and apex. Trace pretibial edema b/l. No significant JVD. Abdomen: No distension, mild tenderness throughout, no masses palpated. Bowel sounds normal.  Musculoskeletal: no clubbing / cyanosis. No joint deformity upper and lower extremities. Normal muscle tone.  Skin: no significant rashes, lesions, ulcers. Warm, dry, well-perfused. Poor turgor.  Neurologic: CN 2-12 grossly intact. Sensation intact, DTR normal. Strength 5/5 in all 4 limbs.  Psychiatric: Normal judgment and insight. Alert and oriented x 3. Normal mood and affect.     Labs on Admission: I have personally reviewed following labs and imaging studies  CBC:  Recent Labs Lab 10/29/16 0300  WBC 5.7  NEUTROABS 5.3  HGB 13.1  HCT 39.1  MCV 93.8  PLT 123456*   Basic Metabolic Panel:  Recent Labs Lab 10/29/16 0300  NA 136  K 3.4*  CL 100*  CO2 26  GLUCOSE 166*  BUN 38*  CREATININE 1.10  CALCIUM 9.4     GFR: CrCl cannot be calculated (Unknown ideal weight.). Liver Function Tests:  Recent Labs Lab 10/29/16 0300  AST 28  ALT 16*  ALKPHOS 56  BILITOT 0.9  PROT 6.6  ALBUMIN 3.9    Recent Labs Lab 10/29/16 0300  LIPASE 27   No results for input(s): AMMONIA in the last 168 hours. Coagulation Profile: No results for input(s): INR, PROTIME in the last 168 hours. Cardiac Enzymes: No results for input(s): CKTOTAL, CKMB, CKMBINDEX, TROPONINI in the last 168 hours. BNP (last 3 results) No results for input(s): PROBNP in the last 8760 hours. HbA1C: No results for input(s): HGBA1C in the last 72 hours. CBG: No results for input(s): GLUCAP in the last 168 hours. Lipid Profile: No results for input(s): CHOL, HDL, LDLCALC, TRIG, CHOLHDL, LDLDIRECT in the last 72 hours. Thyroid Function Tests: No results for input(s): TSH, T4TOTAL, FREET4, T3FREE, THYROIDAB in the last 72 hours. Anemia Panel: No results for input(s): VITAMINB12, FOLATE, FERRITIN, TIBC, IRON, RETICCTPCT in the last 72 hours. Urine analysis:    Component Value Date/Time   COLORURINE YELLOW 11/03/2015 0100   APPEARANCEUR CLEAR 11/03/2015 0100   LABSPEC <1.005 (L) 11/03/2015 0100   PHURINE 6.5 11/03/2015 0100   GLUCOSEU NEGATIVE 11/03/2015 0100   HGBUR TRACE (A) 11/03/2015 0100   BILIRUBINUR NEGATIVE 11/03/2015 0100   KETONESUR NEGATIVE 11/03/2015 0100   PROTEINUR NEGATIVE 11/03/2015 0100   UROBILINOGEN 0.2 05/27/2015 0849   NITRITE NEGATIVE 11/03/2015 0100   LEUKOCYTESUR NEGATIVE 11/03/2015 0100   Sepsis Labs: @LABRCNTIP (procalcitonin:4,lacticidven:4) )No results found for this or any previous visit (from the past 240 hour(s)).   Radiological Exams on Admission: Dg Chest Port 1 View  Result Date: 10/29/2016 CLINICAL DATA:  Chest pain earlier today. Low oxygen level. Patient started feeling sick to the stomach after eating at [employer] last evening. Diarrhea. EXAM: PORTABLE CHEST 1 VIEW COMPARISON:   11/01/2014 FINDINGS: Borderline heart size with normal pulmonary vascularity. There is infiltration throughout the right lung most prominent in the right lower lobe. This may be due to pneumonia or asymmetrical edema. The left lung is generally clear. No pneumothorax. No blunting of costophrenic angles. Calcified and tortuous aorta. IMPRESSION: Infiltration in the right  lung, most prominent in the right lung base, suggesting pneumonia or asymmetric edema. Electronically Signed   By: Lucienne Capers M.D.   On: 10/29/2016 03:45    EKG: Independently reviewed. Sinus rhythm, borderline repolarization abnormality   Assessment/Plan  1. CAP, acute hypoxic respiratory failure  - Pt presents with hypoxia, saturating 86% on rm air; CXR reveals right-sided consolidation - Blood cultures were obtained in the ED and patient was started on empiric Rocephin and azithromycin  - Check sputum culture and gram stain, check urine for strep pneumo antigens, and continue empiric Rocephin and azithromycin  - 1 liter bolus NS was given in ED, lactate pending  - Continue supplemental oxygen prn    2. Diarrhea, watery  - Pt denies significant abd pain, recent travel, recent abx, or sick contacts  - Continue IVF hydration, monitor and replete lytes, check GI pathogen panel   - Full liquid diet for now, advance as tolerated   3. CAD - No anginal complaints and no acute ischemic features on EKG  - Continue ASA, Plavix, Toprol, Lipitor  4. GERD - Esophageal dysplasia was noted on remote EGD with biopsies  - Continue PPI   5. Thrombocytopenia - Platelets 148,000 on admission; this is improved from priors and there is no bleeding    6. Hypertension  - At goal currently  - Continue Toprol as tolerated   7. Hypokalemia  - Serum potassium 3.4, likely secondary to GI-losses  - KCl added to IVF     DVT prophylaxis: sq Lovenox Code Status: DNR Family Communication: Daughter updated at bedside at patient's  request Disposition Plan: Admit to med-surg Consults called: None Admission status: Inpatient    Vianne Bulls, MD [facility] Pager [phone]  If 7PM-7AM, please contact night-coverage [email or web address] Password [facility]  10/29/2016, 4:22 AM

## 2016-10-30 LAB — HIV ANTIBODY (ROUTINE TESTING W REFLEX): HIV SCREEN 4TH GENERATION: NONREACTIVE

## 2016-10-30 MED ORDER — LEVOFLOXACIN 500 MG PO TABS: 500.0000 mg | ORAL_TABLET | Freq: Every day | ORAL | 0 refills | Status: DC

## 2016-10-30 NOTE — Progress Notes (Signed)
Patient discharged home.  IV removed - WNL.  Reviewed DC instructions and medications with daughter.  Verbalizes understanding.  Instructed to follow up with PCP within 2 weeks.  No questions at this time.  Patient assisted off unit via [place] in NAD.

## 2016-10-30 NOTE — Final Progress Note (Signed)
Notified Dr. Jerilee Hoh due the patients daughter having questions about the patients discharge.  She wanted to know if the patient had to be d/c'd today.  I voiced to the patients daughter that the patients room O2 sats were normal at 94% and according to the MD his pneumonia was better.  So I notified the MD and she talked with the patients daughter via telephone and no new orders were given.  MD says to d/c patient home.

## 2016-10-30 NOTE — Discharge Summary (Signed)
Physician Discharge Summary  [name] [id number] DOB: 1918/03/16 DOA: 10/29/2016  PCP: Philis Fendt, MD  Admit date: 10/29/2016 Discharge date: 10/30/2016  Time spent: 45 minutes  Recommendations for Outpatient Follow-up:  -Will be discharged home today. -Advised to follow up with PCP in 2 weeks. -To complete 5 more days of levaquin for CAP.   Discharge Diagnoses:  Principal Problem:   Community acquired pneumonia Active Problems:   Essential hypertension   CAD S/P RCA stent '06   GERD   Thrombocytopenia ([place])   Acute respiratory failure with hypoxia ([place])   Diarrhea   CAP (community acquired pneumonia)   Discharge Condition: Stable and improved  There were no vitals filed for this visit.  History of present illness:  As per Dr. Myna Hidalgo on 11/22: [name] is a 82 y.o. male with medical history significant for coronary artery disease with stent, GERD, and hypertension who presents to the emergency department with diarrhea and malaise which began the night prior. Patient lives alone and reports doing well until developing some mild nausea and profuse watery diarrhea after dinner. This persisted throughout the night and he eventually called his daughter and was transported to the hospital for evaluation. Patient notes some vague fleeting discomfort in his chest earlier in the day, but denies any active chest pain or palpitations. He has had a cough, but denies significant dyspnea. He endorses multiple episodes of watery diarrhea in the last several hours but denies significant abdominal pain, melena, or hematochezia. There is been some nausea, but no vomiting reported. Patient denies sick contacts or recent long distance travel. He denies fevers or chills. He has not been on antibiotics or hospitalized recently.  ED Course: Upon arrival to the ED, patient is found to be afebrile, saturating only 86% on room air, tachypneic in the high 20s, and with stable heart rate  and blood pressure. EKG features a sinus rhythm with borderline repolarization abnormality and chest x-ray is notable for a right-sided infiltrate. Chemistry panel features a potassium of 3.4 and BUN of 38 with serum creatinine 1.1. CBC is notable only for a stable chronic thrombocytopenia with platelets of 148,000. Blood cultures were obtained and the patient was given 1 L of normal saline, Imodium, Zofran, and treated with empiric azithromycin and Rocephin in the ED. He has remained hemodynamically stable and is saturating adequately on 2 L/m of supplemental oxygen. He will be admitted to the medical-surgical unit for ongoing evaluation and management of pneumonia and acute watery diarrhea.  Hospital Course:   Acute Hypoxemic Resp Failure -Due to CAP. -No current oxygen requirements.  CAP -All cx data remains negative. -Has had good response to rocephin/azithro. -Will DC on levaquin for 5 more days of treatment.  Diarrhea -Resolved prior to admission. -Suspect due to excess stool softeners/laxatives.  Procedures:  None   Consultations:  None  Discharge Instructions  Discharge Instructions    Diet - low sodium heart healthy    Complete by:  As directed    Increase activity slowly    Complete by:  As directed        Medication List    TAKE these medications   alfuzosin 10 MG 24 hr tablet Commonly known as:  UROXATRAL Take 10 mg by mouth daily.   ALPRAZolam 0.25 MG tablet Commonly known as:  XANAX Take 0.25 mg by mouth 2 (two) times daily as needed for sleep or anxiety.   aspirin 325 MG EC tablet Take 81 mg  by mouth daily.   atorvastatin 20 MG tablet Commonly known as:  LIPITOR Take 20 mg by mouth daily.   cetirizine 10 MG tablet Commonly known as:  ZYRTEC Take 10 mg by mouth daily.   clopidogrel 75 MG tablet Commonly known as:  PLAVIX Take 75 mg by mouth daily.   diclofenac sodium 1 % Gel Commonly known as:  VOLTAREN Apply 4 g topically 4 (four) times  daily.   ferrous sulfate 325 (65 FE) MG tablet Take 325 mg by mouth daily with breakfast.   lansoprazole 15 MG capsule Commonly known as:  PREVACID Take 15 mg by mouth daily at 12 noon.   levofloxacin 500 MG tablet Commonly known as:  LEVAQUIN Take 1 tablet (500 mg total) by mouth daily.   meclizine 25 MG tablet Commonly known as:  ANTIVERT Take 25 mg by mouth every 6 (six) hours as needed for dizziness.   METAMUCIL PO Take 1 scoop by mouth daily.   metoprolol succinate 25 MG 24 hr tablet Commonly known as:  TOPROL-XL Take 12.5 mg by mouth 2 (two) times daily.   MULTIVITAMIN PO Take 1 tablet by mouth daily.   ICAPS MV PO Take 1 capsule by mouth at bedtime.   polyethylene glycol powder powder Commonly known as:  GLYCOLAX/MIRALAX Take 17 g by mouth at bedtime.   pregabalin 50 MG capsule Commonly known as:  LYRICA Take 50 mg by mouth at bedtime.   sertraline 25 MG tablet Commonly known as:  ZOLOFT Take 25 mg by mouth daily.   traMADol 50 MG tablet Commonly known as:  ULTRAM Take 50 mg by mouth 2 (two) times daily as needed.      Allergies  Allergen Reactions  . Hydrocodone Anaphylaxis and Nausea And Vomiting  . Oxycodone Nausea And Vomiting   Follow-up Information    Philis Fendt, MD. Schedule an appointment as soon as possible for a visit in 2 week(s).   Specialty:  Internal Medicine Contact information: [address] [place] [address] [phone]            The results of significant diagnostics from this hospitalization (including imaging, microbiology, ancillary and laboratory) are listed below for reference.    Significant Diagnostic Studies: Dg Esophagus  Result Date: 10/02/2016 CLINICAL DATA:  Patient has a sensation of knot in throat area. Hx of hiatal hernia, hypertension, tonsillectomy, EGD x 4, and trouble swallowing. EXAM: ESOPHOGRAM / BARIUM SWALLOW / BARIUM TABLET STUDY TECHNIQUE: Combined double contrast and single  contrast examination performed using effervescent crystals, thick barium liquid, and thin barium liquid. The patient was observed with fluoroscopy swallowing a 13 mm barium sulphate tablet. FLUOROSCOPY TIME:  Fluoroscopy Time:  2 minutes and 36 seconds Radiation Exposure Index (if provided by the fluoroscopic device): 89.60 mGy Number of Acquired Spot Images: 6 COMPARISON:  None. FINDINGS: Patient had oral pharyngeal dysphagia. The barium tablet was retained in the oral cavity taking multiple sulcal water to move the tablet into the esophagus. The esophagus is normal in course. There is no esophageal dilation. No esophageal mass. There is irregular fold thickening of the mid to distal esophagus. This lies above a small hiatal hernia. There is no significant stricture. There is also irregular fold thickening of the gastric cardia and visualized body and fundus. No convincing erosion or ulceration. There is esophageal dysmotility with significant tertiary contractions and barium stasis. The 13 mm barium tablet passed through the esophagus without significant hold up following the oral phase transit delay.  No reflux was documented during the exam. IMPRESSION: 1. Findings are consistent with esophagitis of the mid to distal esophagus as well as gastritis of the visualized upper stomach. 2. Small hiatal hernia. 3. No significant esophageal stricture.  No esophageal mass. 4. Esophageal dysmotility with significant tertiary contractions. 5. Patient demonstrated a significant delay in the oral phase of swallowing with the 13 mm barium tablet. No significant hold up to passage of the barium tablet was seen during the esophageal phase of swallowing. Electronically Signed   By: Lajean Manes M.D.   On: 10/02/2016 10:47   Dg Chest Port 1 View  Result Date: 10/29/2016 CLINICAL DATA:  Chest pain earlier today. Low oxygen level. Patient started feeling sick to the stomach after eating at [employer] last evening. Diarrhea.  EXAM: PORTABLE CHEST 1 VIEW COMPARISON:  11/01/2014 FINDINGS: Borderline heart size with normal pulmonary vascularity. There is infiltration throughout the right lung most prominent in the right lower lobe. This may be due to pneumonia or asymmetrical edema. The left lung is generally clear. No pneumothorax. No blunting of costophrenic angles. Calcified and tortuous aorta. IMPRESSION: Infiltration in the right lung, most prominent in the right lung base, suggesting pneumonia or asymmetric edema. Electronically Signed   By: Lucienne Capers M.D.   On: 10/29/2016 03:45    Microbiology: Recent Results (from the past 240 hour(s))  Culture, blood (routine x 2)     Status: None (Preliminary result)   Collection Time: 10/29/16  3:47 AM  Result Value Ref Range Status   Specimen Description BLOOD LEFT FOREARM  Final   Special Requests   Final    BOTTLES DRAWN AEROBIC AND ANAEROBIC AEB=6CC ANA=4CC   Culture NO GROWTH 1 DAY  Final   Report Status PENDING  Incomplete  Culture, blood (routine x 2)     Status: None (Preliminary result)   Collection Time: 10/29/16  3:58 AM  Result Value Ref Range Status   Specimen Description BLOOD LEFT ANTECUBITAL  Final   Special Requests   Final    BOTTLES DRAWN AEROBIC AND ANAEROBIC AEB=10CC ANA=4CC   Culture NO GROWTH 1 DAY  Final   Report Status PENDING  Incomplete     Labs: Basic Metabolic Panel:  Recent Labs Lab 10/29/16 0300  NA 136  K 3.4*  CL 100*  CO2 26  GLUCOSE 166*  BUN 38*  CREATININE 1.10  CALCIUM 9.4   Liver Function Tests:  Recent Labs Lab 10/29/16 0300  AST 28  ALT 16*  ALKPHOS 56  BILITOT 0.9  PROT 6.6  ALBUMIN 3.9    Recent Labs Lab 10/29/16 0300  LIPASE 27   No results for input(s): AMMONIA in the last 168 hours. CBC:  Recent Labs Lab 10/29/16 0300  WBC 5.7  NEUTROABS 5.3  HGB 13.1  HCT 39.1  MCV 93.8  PLT 148*   Cardiac Enzymes: No results for input(s): CKTOTAL, CKMB, CKMBINDEX, TROPONINI in the last 168  hours. BNP: BNP (last 3 results) No results for input(s): BNP in the last 8760 hours.  ProBNP (last 3 results) No results for input(s): PROBNP in the last 8760 hours.  CBG: No results for input(s): GLUCAP in the last 168 hours.     SignedLelon Frohlich  [facility] Pager: [phone] 10/30/2016, 3:10 PM

## 2016-11-03 ENCOUNTER — Telehealth: Payer: Self-pay | Admitting: Gastroenterology

## 2016-11-03 LAB — CULTURE, BLOOD (ROUTINE X 2)
CULTURE: NO GROWTH
Culture: NO GROWTH

## 2016-11-03 NOTE — Telephone Encounter (Signed)
noted 

## 2016-11-03 NOTE — Telephone Encounter (Signed)
I spoke to the patient in the office. He doe not want to have the EGD. He said he is swallowing ok at this time. Said he cannot lay down to have the procedure because his back is in such a shape.  He said please let Dr. Gala Romney know that he appreciates everything that he has done for him. And he is so sorry to not have this done.

## 2016-11-03 NOTE — Telephone Encounter (Signed)
PATIENT CAME IN THE OFFICE UPSET THAT HE THINKS HE WILL BE HAVING A PROCEDURE AND HE CAN NOT LAY ON HIS BACK.  HAS HAD PRIOR SURGERY AND CAN NOT LAY DOWN.  I DID NOT SEE WHERE HE IS SCHEDULED FOR ANYTHING

## 2016-11-03 NOTE — Telephone Encounter (Signed)
Forwarding FYI to Dr. Gala Romney and Neil Crouch, PA.

## 2016-11-03 NOTE — Telephone Encounter (Signed)
Noted  

## 2016-11-03 NOTE — Telephone Encounter (Signed)
Communication noted.  

## 2016-11-06 ENCOUNTER — Emergency Department (HOSPITAL_COMMUNITY)
Admission: EM | Admit: 2016-11-06 | Discharge: 2016-11-06 | Disposition: A | Discharge status: Home / Self Care | Payer: Medicare Other | Source: Home / Self Care | Attending: Emergency Medicine | Admitting: Emergency Medicine

## 2016-11-06 ENCOUNTER — Emergency Department (HOSPITAL_COMMUNITY)
Admission: EM | Admit: 2016-11-06 | Discharge: 2016-11-06 | Disposition: A | Discharge status: Home / Self Care | Payer: Medicare Other | Attending: Emergency Medicine | Admitting: Emergency Medicine

## 2016-11-06 ENCOUNTER — Encounter (HOSPITAL_COMMUNITY): Payer: Self-pay | Admitting: Emergency Medicine

## 2016-11-06 ENCOUNTER — Emergency Department (HOSPITAL_COMMUNITY): Payer: Medicare Other

## 2016-11-06 ENCOUNTER — Encounter (HOSPITAL_COMMUNITY): Payer: Self-pay

## 2016-11-06 DIAGNOSIS — Z87891 Personal history of nicotine dependence: Secondary | ICD-10-CM

## 2016-11-06 DIAGNOSIS — Z85828 Personal history of other malignant neoplasm of skin: Secondary | ICD-10-CM

## 2016-11-06 DIAGNOSIS — R399 Unspecified symptoms and signs involving the genitourinary system: Secondary | ICD-10-CM

## 2016-11-06 DIAGNOSIS — I10 Essential (primary) hypertension: Secondary | ICD-10-CM | POA: Insufficient documentation

## 2016-11-06 DIAGNOSIS — Z7982 Long term (current) use of aspirin: Secondary | ICD-10-CM | POA: Insufficient documentation

## 2016-11-06 DIAGNOSIS — R339 Retention of urine, unspecified: Secondary | ICD-10-CM | POA: Insufficient documentation

## 2016-11-06 DIAGNOSIS — Z79899 Other long term (current) drug therapy: Secondary | ICD-10-CM

## 2016-11-06 DIAGNOSIS — T83098A Other mechanical complication of other indwelling urethral catheter, initial encounter: Secondary | ICD-10-CM | POA: Diagnosis present

## 2016-11-06 DIAGNOSIS — I2510 Atherosclerotic heart disease of native coronary artery without angina pectoris: Secondary | ICD-10-CM | POA: Insufficient documentation

## 2016-11-06 DIAGNOSIS — Y658 Other specified misadventures during surgical and medical care: Secondary | ICD-10-CM | POA: Diagnosis not present

## 2016-11-06 DIAGNOSIS — Z978 Presence of other specified devices: Secondary | ICD-10-CM

## 2016-11-06 LAB — I-STAT CHEM 8, ED
BUN: 19 mg/dL (ref 6–20)
CALCIUM ION: 1.16 mmol/L (ref 1.15–1.40)
Chloride: 93 mmol/L — ABNORMAL LOW (ref 101–111)
Creatinine, Ser: 0.7 mg/dL (ref 0.61–1.24)
Glucose, Bld: 117 mg/dL — ABNORMAL HIGH (ref 65–99)
HEMATOCRIT: 38 % — AB (ref 39.0–52.0)
Hemoglobin: 12.9 g/dL — ABNORMAL LOW (ref 13.0–17.0)
Potassium: 3.8 mmol/L (ref 3.5–5.1)
SODIUM: 132 mmol/L — AB (ref 135–145)
TCO2: 27 mmol/L (ref 0–100)

## 2016-11-06 LAB — URINALYSIS, ROUTINE W REFLEX MICROSCOPIC
BILIRUBIN URINE: NEGATIVE
BILIRUBIN URINE: NEGATIVE
GLUCOSE, UA: NEGATIVE mg/dL
Glucose, UA: NEGATIVE mg/dL
HGB URINE DIPSTICK: NEGATIVE
KETONES UR: NEGATIVE mg/dL
Ketones, ur: NEGATIVE mg/dL
LEUKOCYTES UA: NEGATIVE
Leukocytes, UA: NEGATIVE
NITRITE: NEGATIVE
Nitrite: NEGATIVE
PH: 7 (ref 5.0–8.0)
PROTEIN: NEGATIVE mg/dL
PROTEIN: NEGATIVE mg/dL
Specific Gravity, Urine: 1.01 (ref 1.005–1.030)
Specific Gravity, Urine: 1.01 (ref 1.005–1.030)
pH: 7 (ref 5.0–8.0)

## 2016-11-06 LAB — URINE MICROSCOPIC-ADD ON
Bacteria, UA: NONE SEEN
SQUAMOUS EPITHELIAL / LPF: NONE SEEN
WBC UA: NONE SEEN WBC/hpf (ref 0–5)

## 2016-11-06 NOTE — Discharge Instructions (Signed)
Please make an appointment with [facility] to be rechecked next week to see if the catheter can be removed. Have him rechecked if he gets a fever or vomiting.

## 2016-11-06 NOTE — ED Notes (Signed)
Bladder scanner performed, 389ml in bladder

## 2016-11-06 NOTE — ED Triage Notes (Signed)
Pt cut indwelling cath with knife.  Cath in half.  Cath bag and part of catheter attached to bag.  Pt is not sure where the remainder of the catheter tip is.  Denies any pain.  Pt is answering questions appropriate.   Pt says he did not want to call and bother his daughter Brems).

## 2016-11-06 NOTE — ED Provider Notes (Addendum)
[facility] DEPT Provider Note   CSN: [id number] Arrival date & time: 11/06/16  [id number]  By signing my name below, I, Rayna Sexton, attest that this documentation has been prepared under the direction and in the presence of Fredia Sorrow, MD. Electronically Signed: Rayna Sexton, ED Scribe. 11/06/16. 8:35 AM.   History   Chief Complaint Chief Complaint  Patient presents with  . Catheter Problem   LEVEL FIVE CAVEAT: AGE  HPI HPI Comments: ISSACK BACHAND is a 82 y.o. male who presents to the Emergency Department by ambulance complaining of issues with his foley catheter. Pt was seen last night due to difficulty urinating. He had a scan of his bladder and a foley inserted resulting in 600 mL of urine. His UA was nml and he was d/c with a referral to [facility] as well as a foley catheter. Pt states that since being d/c his foley catheter "became tangled" and came out. Pt is extremely hard of hearing and is having difficulty answering provider's questions.   The history is provided by the patient and medical records. No language interpreter was used.    Past Medical History:  Diagnosis Date  . AAA (abdominal aortic aneurysm) ([place])   . Anemia   . Anemia   . Arthritis   . Arthritis   . CAD (coronary artery disease)   . Cancer ([place]) 2016   skin caner: Forehead  . GERD (gastroesophageal reflux disease)   . Hiatal hernia   . Hyperlipidemia   . Hypertension   . Macular degeneration   . Peripheral vascular disease ([place])   . Trouble swallowing     Patient Active Problem List   Diagnosis Date Noted  . Acute respiratory failure with hypoxia ([place]) 10/29/2016  . Diarrhea 10/29/2016  . CAP (community acquired pneumonia) 10/29/2016  . Odynophagia 09/23/2016  . Community acquired pneumonia 09/07/2014  . Sepsis ([place]) 09/07/2014  . Acute encephalopathy 09/07/2014  . Thrombocytopenia ([place]) 09/07/2014  . Carotid stenosis 07/14/2014  . External hemorrhoid 02/28/2013  .  Weakness generalized 02/28/2013  . Hyponatremia 02/18/2013  . Abdominal aortic aneurysm ([place]) 10/12/2012  . DYSPHAGIA 11/15/2009  . CONSTIPATION, CHRONIC 08/10/2009  . HEMATOCHEZIA 08/10/2009  . HYPERCHOLESTEROLEMIA 08/09/2009  . MACULAR DEGENERATION 08/09/2009  . Essential hypertension 08/09/2009  . CAD S/P RCA stent '06 08/09/2009  . TIA 08/09/2009  . GERD 08/09/2009  . ARTHRITIS 08/09/2009  . BENIGN PROSTATIC HYPERTROPHY, HX OF 08/09/2009    Past Surgical History:  Procedure Laterality Date  . ANGIOPLASTY / STENTING ILIAC  03-2011   Right Iliac - Gore Stent graft   . BACK SURGERY  09-05-2011    . COLONOSCOPY  08/2009   Rourk: report unavailable, two tubular adenomas and acute ileitis on path  . CORONARY ANGIOPLASTY WITH STENT PLACEMENT    . ESOPHAGOGASTRODUODENOSCOPY  2008   Rourk: Critical Schazki ring s/p dilation, hiatal hernia  . ESOPHAGOGASTRODUODENOSCOPY  09/2005   Rourk: S-shaped distal esophageal erosion, middle to distal esopahgus s/p multiple biopsies (unavailable)  . ESOPHAGOGASTRODUODENOSCOPY  06/2005   Rourk: solitary mid esophageal lesion s/p bx (low grade dysplasia), prominent schatzki ring, large hh  . ESOPHAGOGASTRODUODENOSCOPY  11/2009   Rourk: Schatzki ring s/p dilation  . EUS     Mishra:  . SPINE SURGERY    . TONSILLECTOMY         Home Medications    Prior to Admission medications   Medication Sig Start Date End Date Taking? Authorizing Provider  levofloxacin (LEVAQUIN) 500 MG tablet Take  1 tablet (500 mg total) by mouth daily. 10/30/16  Yes Erline Hau, MD  alfuzosin (UROXATRAL) 10 MG 24 hr tablet Take 10 mg by mouth daily.      Historical Provider, MD  ALPRAZolam Duanne Moron) 0.25 MG tablet Take 0.25 mg by mouth 2 (two) times daily as needed for sleep or anxiety.     Historical Provider, MD  aspirin 325 MG EC tablet Take 81 mg by mouth daily.     Historical Provider, MD  atorvastatin (LIPITOR) 20 MG tablet Take 20 mg by mouth daily.       Historical Provider, MD  cetirizine (ZYRTEC) 10 MG tablet Take 10 mg by mouth daily.    Historical Provider, MD  clopidogrel (PLAVIX) 75 MG tablet Take 75 mg by mouth daily.      Historical Provider, MD  diclofenac sodium (VOLTAREN) 1 % GEL Apply 4 g topically 4 (four) times daily.    Historical Provider, MD  ferrous sulfate 325 (65 FE) MG tablet Take 325 mg by mouth daily with breakfast.    Historical Provider, MD  lansoprazole (PREVACID) 15 MG capsule Take 15 mg by mouth daily at 12 noon.    Historical Provider, MD  meclizine (ANTIVERT) 25 MG tablet Take 25 mg by mouth every 6 (six) hours as needed for dizziness.    Historical Provider, MD  metoprolol succinate (TOPROL-XL) 25 MG 24 hr tablet Take 12.5 mg by mouth 2 (two) times daily.  11/21/13   Historical Provider, MD  Multiple Vitamin (MULTIVITAMIN PO) Take 1 tablet by mouth daily.     Historical Provider, MD  Multiple Vitamins-Minerals (ICAPS MV PO) Take 1 capsule by mouth at bedtime.     Historical Provider, MD  polyethylene glycol powder (GLYCOLAX/MIRALAX) powder Take 17 g by mouth at bedtime.    Historical Provider, MD  pregabalin (LYRICA) 50 MG capsule Take 50 mg by mouth at bedtime.    Historical Provider, MD  Psyllium (METAMUCIL PO) Take 1 scoop by mouth daily.    Historical Provider, MD  sertraline (ZOLOFT) 25 MG tablet Take 25 mg by mouth daily.    Historical Provider, MD  traMADol (ULTRAM) 50 MG tablet Take 50 mg by mouth 2 (two) times daily as needed.     Historical Provider, MD    Family History Family History  Problem Relation Age of Onset  . Heart failure Mother   . Diabetes Mother   . Heart disease Mother   . Pneumonia Father   . Cancer Brother   . Diabetes Brother   . Heart disease Brother     Social History Social History  Substance Use Topics  . Smoking status: Former Smoker    Types: Cigarettes    Quit date: 12/08/1990  . Smokeless tobacco: Former Systems developer    Types: Chew    Quit date: 09/13/2014  . Alcohol use  No     Allergies   Hydrocodone and Oxycodone   Review of Systems Review of Systems  Unable to perform ROS: Age   Physical Exam Updated Vital Signs BP 177/75 (BP Location: Left Arm)   Pulse 71   Temp 98.2 F (36.8 C) (Oral)   Resp 20   Ht 5\' 7"  (1.702 m)   Wt 65.8 kg   SpO2 97%   BMI 22.71 kg/m   Physical Exam  Constitutional: He appears well-developed. No distress.  HENT:  Head: Normocephalic and atraumatic.  Mouth/Throat: Oropharynx is clear and moist.  Eyes: Conjunctivae and EOM are normal.  Pupils are equal, round, and reactive to light. No scleral icterus.  Neck: Normal range of motion. Neck supple. No tracheal deviation present.  Cardiovascular: Normal rate, regular rhythm, normal heart sounds and intact distal pulses.   Pulmonary/Chest: Effort normal and breath sounds normal. No respiratory distress. He has no wheezes. He has no rales.  Abdominal: Soft. Bowel sounds are normal. He exhibits distension. There is tenderness.  Bladder feels distended with mild discomfort in the suprapubic region.  Genitourinary:  Genitourinary Comments: Chaperone present: No blood noted at tip of meatus.   Musculoskeletal: Normal range of motion. He exhibits no edema.  No edema noted in the bilateral ankles  Neurological: He is alert.  Skin: Skin is warm and dry.  Psychiatric: He has a normal mood and affect. His behavior is normal.  Nursing note and vitals reviewed.  ED Treatments / Results  Labs (all labs ordered are listed, but only abnormal results are displayed) Labs Reviewed  URINALYSIS, ROUTINE W REFLEX MICROSCOPIC (NOT AT [facility]) - Abnormal; Notable for the following:       Result Value   Hgb urine dipstick MODERATE (*)    All other components within normal limits  I-STAT CHEM 8, ED - Abnormal; Notable for the following:    Sodium 132 (*)    Chloride 93 (*)    Glucose, Bld 117 (*)    Hemoglobin 12.9 (*)    HCT 38.0 (*)    All other components within normal limits    URINE MICROSCOPIC-ADD ON    EKG  EKG Interpretation None       Radiology Dg Pelvis 1-2 Views  Result Date: 11/06/2016 CLINICAL DATA:  Possible retained Foley catheter EXAM: PELVIS - 1-2 VIEW COMPARISON:  07/31/2015 FINDINGS: Single frontal view of the pelvis submitted. Diffuse osteopenia. Mild degenerative changes. No acute fracture or subluxation. Aorto bi-iliac stent is noted. Postsurgical coils are noted in the right iliac region. No Foley catheter is identified within pelvis. Atherosclerotic calcifications of femoral arteries. IMPRESSION: Postsurgical changes as described above. No Foley catheter is identified. Electronically Signed   By: Lahoma Crocker M.D.   On: 11/06/2016 09:42    Procedures Procedures  DIAGNOSTIC STUDIES: Oxygen Saturation is 97% on RA, normal by my interpretation.    COORDINATION OF CARE: 8:34 AM Pt is hard of hearing and is having difficulty providing clear answers to questions. Will move forward based on medical history and physical exam findings.   Medications Ordered in ED Medications - No data to display   Initial Impression / Assessment and Plan / ED Course  I have reviewed the triage vital signs and the nursing notes.  Pertinent labs & imaging results that were available during my care of the patient were reviewed by me and considered in my medical decision making (see chart for details).  Clinical Course   Patient cut his Foley catheter in half. With a knife. He thought that the proximal portion went into the bladder. X-rays negative. Foley catheter replaced. Urinalysis without evidence of urinary tract infection renal functions normal. Foley replaced draining well.  I personally performed the services described in this documentation, which was scribed in my presence. The recorded information has been reviewed and is accurate.    Final Clinical Impressions(s) / ED Diagnoses   Final diagnoses:  Complaint about urinary catheter    New  Prescriptions New Prescriptions   No medications on file     Fredia Sorrow, MD 11/06/16 1237  Emergency records reviewed from last night.  When the Foley catheter was placed patient at 600 mL of urine. Patient had urinary retention issues will need a Foley catheter. That's why it was replaced again today. Will need to follow-up with his urologist.    Fredia Sorrow, MD 11/06/16 1320

## 2016-11-06 NOTE — Discharge Instructions (Signed)
Keep Foley catheter in place. Follow-up with your doctors.

## 2016-11-06 NOTE — ED Triage Notes (Signed)
Pt reports diff urinating "x 24 hours" but daughter reports she doesn't think it could be that long because his caregiver would have said something about it.  Pt state he feels like he is going to burst.

## 2016-11-06 NOTE — ED Notes (Signed)
Bladder scan performed 360ml in

## 2016-11-06 NOTE — ED Provider Notes (Signed)
[facility] DEPT Provider Note   CSN: [id number] Arrival date & time: 11/06/16  0013  Time seen 01:07 AM   History   Chief Complaint Chief Complaint  Patient presents with  . Urinary Retention   Level V caveat for age  HPI [name] is a 82 y.o. male.  HPI  patient presents to emergency prompt his daughter. I had actually seen the patient on November 22 for lower abdominal pain and at that time he had a pneumonia and was admitted. Daughter states he was discharged from a week ago. He finished his oral antibiotics which was for 5 days. He has been weak since she's been home but doing well. She states he was constipated yesterday and his home health nurse gave him an enema with relief. She states this evening he told her he was having difficulty urinating. She states in the past he's had urinary retention after surgery and sometimes he has low urine in his bladder. He states he's been unable to urinate for the past 24 hours. She also states he has a lot of prostate issues.  Past Medical History:  Diagnosis Date  . AAA (abdominal aortic aneurysm) ([place])   . Anemia   . Anemia   . Arthritis   . Arthritis   . CAD (coronary artery disease)   . Cancer ([place]) 2016   skin caner: Forehead  . GERD (gastroesophageal reflux disease)   . Hiatal hernia   . Hyperlipidemia   . Hypertension   . Macular degeneration   . Peripheral vascular disease ([place])   . Trouble swallowing     Patient Active Problem List   Diagnosis Date Noted  . Acute respiratory failure with hypoxia ([place]) 10/29/2016  . Diarrhea 10/29/2016  . CAP (community acquired pneumonia) 10/29/2016  . Odynophagia 09/23/2016  . Community acquired pneumonia 09/07/2014  . Sepsis ([place]) 09/07/2014  . Acute encephalopathy 09/07/2014  . Thrombocytopenia ([place]) 09/07/2014  . Carotid stenosis 07/14/2014  . External hemorrhoid 02/28/2013  . Weakness generalized 02/28/2013  . Hyponatremia 02/18/2013  . Abdominal aortic aneurysm  ([place]) 10/12/2012  . DYSPHAGIA 11/15/2009  . CONSTIPATION, CHRONIC 08/10/2009  . HEMATOCHEZIA 08/10/2009  . HYPERCHOLESTEROLEMIA 08/09/2009  . MACULAR DEGENERATION 08/09/2009  . Essential hypertension 08/09/2009  . CAD S/P RCA stent '06 08/09/2009  . TIA 08/09/2009  . GERD 08/09/2009  . ARTHRITIS 08/09/2009  . BENIGN PROSTATIC HYPERTROPHY, HX OF 08/09/2009    Past Surgical History:  Procedure Laterality Date  . ANGIOPLASTY / STENTING ILIAC  03-2011   Right Iliac - Gore Stent graft   . BACK SURGERY  09-05-2011    . COLONOSCOPY  08/2009   Rourk: report unavailable, two tubular adenomas and acute ileitis on path  . CORONARY ANGIOPLASTY WITH STENT PLACEMENT    . ESOPHAGOGASTRODUODENOSCOPY  2008   Rourk: Critical Schazki ring s/p dilation, hiatal hernia  . ESOPHAGOGASTRODUODENOSCOPY  09/2005   Rourk: S-shaped distal esophageal erosion, middle to distal esopahgus s/p multiple biopsies (unavailable)  . ESOPHAGOGASTRODUODENOSCOPY  06/2005   Rourk: solitary mid esophageal lesion s/p bx (low grade dysplasia), prominent schatzki ring, large hh  . ESOPHAGOGASTRODUODENOSCOPY  11/2009   Rourk: Schatzki ring s/p dilation  . EUS     Mishra:  . SPINE SURGERY    . TONSILLECTOMY         Home Medications    Prior to Admission medications   Medication Sig Start Date End Date Taking? Authorizing Provider  alfuzosin (UROXATRAL) 10 MG 24 hr tablet Take 10 mg  by mouth daily.      Historical Provider, MD  ALPRAZolam Duanne Moron) 0.25 MG tablet Take 0.25 mg by mouth 2 (two) times daily as needed for sleep or anxiety.     Historical Provider, MD  aspirin 325 MG EC tablet Take 81 mg by mouth daily.     Historical Provider, MD  atorvastatin (LIPITOR) 20 MG tablet Take 20 mg by mouth daily.      Historical Provider, MD  cetirizine (ZYRTEC) 10 MG tablet Take 10 mg by mouth daily.    Historical Provider, MD  clopidogrel (PLAVIX) 75 MG tablet Take 75 mg by mouth daily.      Historical Provider, MD    diclofenac sodium (VOLTAREN) 1 % GEL Apply 4 g topically 4 (four) times daily.    Historical Provider, MD  ferrous sulfate 325 (65 FE) MG tablet Take 325 mg by mouth daily with breakfast.    Historical Provider, MD  lansoprazole (PREVACID) 15 MG capsule Take 15 mg by mouth daily at 12 noon.    Historical Provider, MD  levofloxacin (LEVAQUIN) 500 MG tablet Take 1 tablet (500 mg total) by mouth daily. 10/30/16   Erline Hau, MD  meclizine (ANTIVERT) 25 MG tablet Take 25 mg by mouth every 6 (six) hours as needed for dizziness.    Historical Provider, MD  metoprolol succinate (TOPROL-XL) 25 MG 24 hr tablet Take 12.5 mg by mouth 2 (two) times daily.  11/21/13   Historical Provider, MD  Multiple Vitamin (MULTIVITAMIN PO) Take 1 tablet by mouth daily.     Historical Provider, MD  Multiple Vitamins-Minerals (ICAPS MV PO) Take 1 capsule by mouth at bedtime.     Historical Provider, MD  polyethylene glycol powder (GLYCOLAX/MIRALAX) powder Take 17 g by mouth at bedtime.    Historical Provider, MD  pregabalin (LYRICA) 50 MG capsule Take 50 mg by mouth at bedtime.    Historical Provider, MD  Psyllium (METAMUCIL PO) Take 1 scoop by mouth daily.    Historical Provider, MD  sertraline (ZOLOFT) 25 MG tablet Take 25 mg by mouth daily.    Historical Provider, MD  traMADol (ULTRAM) 50 MG tablet Take 50 mg by mouth 2 (two) times daily as needed.     Historical Provider, MD    Family History Family History  Problem Relation Age of Onset  . Heart failure Mother   . Diabetes Mother   . Heart disease Mother   . Pneumonia Father   . Cancer Brother   . Diabetes Brother   . Heart disease Brother     Social History Social History  Substance Use Topics  . Smoking status: Former Smoker    Types: Cigarettes    Quit date: 12/08/1990  . Smokeless tobacco: Former Systems developer    Types: Chew    Quit date: 09/13/2014  . Alcohol use No  Lives at home Lives with daughter   Allergies   Hydrocodone and  Oxycodone   Review of Systems Review of Systems  All other systems reviewed and are negative.    Physical Exam Updated Vital Signs Pulse 73   Temp 97.4 F (36.3 C) (Oral)   Resp 20   SpO2 94%   Vital signs normal    Physical Exam  Constitutional: He is oriented to person, place, and time.  Non-toxic appearance. He does not appear ill. No distress.  Thin elderly male who is very hard of hearing  HENT:  Head: Normocephalic and atraumatic.  Right Ear: External ear  normal.  Left Ear: External ear normal.  Nose: Nose normal. No mucosal edema or rhinorrhea.  Mouth/Throat: Oropharynx is clear and moist and mucous membranes are normal. No dental abscesses or uvula swelling.  Eyes: Conjunctivae and EOM are normal. Pupils are equal, round, and reactive to light.  Neck: Normal range of motion and full passive range of motion without pain. Neck supple.  Cardiovascular: Normal rate, regular rhythm and normal heart sounds.  Exam reveals no gallop and no friction rub.   No murmur heard. Pulmonary/Chest: Effort normal and breath sounds normal. No respiratory distress. He has no wheezes. He has no rhonchi. He has no rales. He exhibits no tenderness and no crepitus.  Abdominal: Soft. Normal appearance and bowel sounds are normal. He exhibits distension. There is no tenderness. There is no rebound and no guarding.  Patient appears distended in his lower abdomen and he is tender to palpation but I do not feel distinct mass.  Musculoskeletal: Normal range of motion. He exhibits no edema or tenderness.  Moves all extremities well.   Neurological: He is alert and oriented to person, place, and time. He has normal strength. No cranial nerve deficit.  Skin: Skin is warm, dry and intact. No rash noted. No erythema. No pallor.  Psychiatric: He has a normal mood and affect. His speech is normal and behavior is normal. His mood appears not anxious.  Nursing note and vitals reviewed.    ED Treatments  / Results  Labs (all labs ordered are listed, but only abnormal results are displayed) Results for orders placed or performed during the hospital encounter of 11/06/16  Urinalysis, Routine w reflex microscopic  Result Value Ref Range   Color, Urine YELLOW YELLOW   APPearance HAZY (A) CLEAR   Specific Gravity, Urine 1.010 1.005 - 1.030   pH 7.0 5.0 - 8.0   Glucose, UA NEGATIVE NEGATIVE mg/dL   Hgb urine dipstick NEGATIVE NEGATIVE   Bilirubin Urine NEGATIVE NEGATIVE   Ketones, ur NEGATIVE NEGATIVE mg/dL   Protein, ur NEGATIVE NEGATIVE mg/dL   Nitrite NEGATIVE NEGATIVE   Leukocytes, UA NEGATIVE NEGATIVE   Laboratory interpretation all normal     Procedures Procedures (including critical care time)  Medications Ordered in ED Medications - No data to display   Initial Impression / Assessment and Plan / ED Course  I have reviewed the triage vital signs and the nursing notes.  Pertinent labs & imaging results that were available during my care of the patient were reviewed by me and considered in my medical decision making (see chart for details).  Clinical Course    Bladder scan ordered and was 350 mL of urine  Fully catheter was inserted and he had 600 mL of urine.   Urinalysis is normal so patient was not started on antibiotics. They were referred to [facility] for follow-up next week for his acute urinary retention. He was discharged home with the foley catheter.   Final Clinical Impressions(s) / ED Diagnoses   Final diagnoses:  Urinary retention    Plan discharge  Rolland Porter, MD, Barbette Or, MD 11/06/16 [phone]

## 2016-11-07 ENCOUNTER — Encounter: Payer: Self-pay | Admitting: Internal Medicine

## 2016-11-07 ENCOUNTER — Telehealth: Payer: Self-pay

## 2016-11-07 ENCOUNTER — Other Ambulatory Visit: Payer: Self-pay

## 2016-11-07 ENCOUNTER — Ambulatory Visit (INDEPENDENT_AMBULATORY_CARE_PROVIDER_SITE_OTHER): Payer: Medicare Other | Admitting: Internal Medicine

## 2016-11-07 VITALS — BP 131/57 | HR 66 | Temp 97.8°F | Ht 66.0 in | Wt 145.2 lb

## 2016-11-07 DIAGNOSIS — K219 Gastro-esophageal reflux disease without esophagitis: Secondary | ICD-10-CM | POA: Diagnosis not present

## 2016-11-07 DIAGNOSIS — R1310 Dysphagia, unspecified: Secondary | ICD-10-CM | POA: Diagnosis not present

## 2016-11-07 DIAGNOSIS — R1319 Other dysphagia: Secondary | ICD-10-CM

## 2016-11-07 DIAGNOSIS — I6521 Occlusion and stenosis of right carotid artery: Secondary | ICD-10-CM | POA: Diagnosis not present

## 2016-11-07 NOTE — Patient Instructions (Signed)
Schedule EGD with Esophageal dilation next week - propofol  Further recommendations to follow

## 2016-11-07 NOTE — Telephone Encounter (Signed)
Understood. Let's shoot for some time between now and Christmas. Can we check with prescribing physician to see if he can come off Plavix for 5 days for an EGD/ED

## 2016-11-07 NOTE — Progress Notes (Addendum)
Primary Care Physician:  Philis Fendt, MD Primary Gastroenterologist:  Dr. Gala Romney  Pre-Procedure History & Physical: HPI:  [name] is a 82 y.o. male here for dysphagia. Patient describes having difficulties with meat and bread going down behind his breast bone.  From time to time, he feels a lump in his throat as well.  Patient wants his esophagus stretched again. He does have a history of a Schatzki's ring and has responded to [place] dilation in the past. Barium pill esophagram done month before last demonstrated esophageal dysmotility along with prolonged retention of barium pill in his mouth before he actually swallowed it. No Zenker's diverticulum.  Barium pill passed without delay.  Patient denies reflux symptoms-reportedly on Prevacid 15 mg daily.  Patient also taking Plavix.  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 This was further evaluated by endoscopic ultrasound. No evidence of malignancy found.  Past Medical History:  Diagnosis Date  . AAA (abdominal aortic aneurysm) ([place])   . Anemia   . Anemia   . Arthritis   . Arthritis   . CAD (coronary artery disease)   . Cancer ([place]) 2016   skin caner: Forehead  . GERD (gastroesophageal reflux disease)   . Hiatal hernia   . Hyperlipidemia   . Hypertension   . Macular degeneration   . Peripheral vascular disease ([place])   . Trouble swallowing     Past Surgical History:  Procedure Laterality Date  . ANGIOPLASTY / STENTING ILIAC  03-2011   Right Iliac - Gore Stent graft   . BACK SURGERY  09-05-2011    . COLONOSCOPY  08/2009   Darriel Sinquefield: report unavailable, two tubular adenomas and acute ileitis on path  . CORONARY ANGIOPLASTY WITH STENT PLACEMENT    . ESOPHAGOGASTRODUODENOSCOPY  2008   Ankush Gintz: Critical Schazki ring s/p dilation, hiatal hernia  . ESOPHAGOGASTRODUODENOSCOPY  09/2005   Darlene Brozowski: S-shaped distal esophageal erosion, middle to distal esopahgus s/p multiple biopsies  (unavailable)  . ESOPHAGOGASTRODUODENOSCOPY  06/2005   Shaneal Barasch: solitary mid esophageal lesion s/p bx (low grade dysplasia), prominent schatzki ring, large hh  . ESOPHAGOGASTRODUODENOSCOPY  11/2009   Arielis Leonhart: Schatzki ring s/p dilation  . EUS     Mishra:  . SPINE SURGERY    . TONSILLECTOMY      Prior to Admission medications   Medication Sig Start Date End Date Taking? Authorizing Provider  alfuzosin (UROXATRAL) 10 MG 24 hr tablet Take 10 mg by mouth daily.     Yes Historical Provider, MD  ALPRAZolam (XANAX) 0.25 MG tablet Take 0.25 mg by mouth 2 (two) times daily as needed for sleep or anxiety.    Yes Historical Provider, MD  aspirin 325 MG EC tablet Take 81 mg by mouth daily.    Yes Historical Provider, MD  atorvastatin (LIPITOR) 20 MG tablet Take 20 mg by mouth daily.     Yes Historical Provider, MD  cetirizine (ZYRTEC) 10 MG tablet Take 10 mg by mouth daily.   Yes Historical Provider, MD  clopidogrel (PLAVIX) 75 MG tablet Take 75 mg by mouth daily.     Yes Historical Provider, MD  diclofenac sodium (VOLTAREN) 1 % GEL Apply 4 g topically 4 (four) times daily.   Yes Historical Provider, MD  ferrous sulfate 325 (65 FE) MG tablet Take 325 mg by mouth daily with breakfast.   Yes Historical Provider, MD  lansoprazole (PREVACID) 15 MG capsule Take 15 mg by mouth daily at 12 noon.  Yes Historical Provider, MD  meclizine (ANTIVERT) 25 MG tablet Take 25 mg by mouth every 6 (six) hours as needed for dizziness.   Yes Historical Provider, MD  metoprolol succinate (TOPROL-XL) 25 MG 24 hr tablet Take 12.5 mg by mouth 2 (two) times daily.  11/21/13  Yes Historical Provider, MD  Multiple Vitamin (MULTIVITAMIN PO) Take 1 tablet by mouth daily.    Yes Historical Provider, MD  Multiple Vitamins-Minerals (ICAPS MV PO) Take 1 capsule by mouth at bedtime.    Yes Historical Provider, MD  polyethylene glycol powder (GLYCOLAX/MIRALAX) powder Take 17 g by mouth at bedtime.   Yes Historical Provider, MD  pregabalin  (LYRICA) 50 MG capsule Take 50 mg by mouth at bedtime.   Yes Historical Provider, MD  Psyllium (METAMUCIL PO) Take 1 scoop by mouth daily.   Yes Historical Provider, MD  sertraline (ZOLOFT) 25 MG tablet Take 25 mg by mouth daily.   Yes Historical Provider, MD  traMADol (ULTRAM) 50 MG tablet Take 50 mg by mouth 2 (two) times daily as needed.    Yes Historical Provider, MD  levofloxacin (LEVAQUIN) 500 MG tablet Take 1 tablet (500 mg total) by mouth daily. Patient not taking: Reported on 11/07/2016 10/30/16   Erline Hau, MD    Allergies as of 11/07/2016 - Review Complete 11/07/2016  Allergen Reaction Noted  . Hydrocodone Anaphylaxis and Nausea And Vomiting   . Oxycodone Nausea And Vomiting 12/12/2012    Family History  Problem Relation Age of Onset  . Heart failure Mother   . Diabetes Mother   . Heart disease Mother   . Pneumonia Father   . Cancer Brother   . Diabetes Brother   . Heart disease Brother     Social History   Social History  . Marital status: Widowed    Spouse name: N/A  . Number of children: N/A  . Years of education: N/A   Occupational History  . Not on file.   Social History Main Topics  . Smoking status: Former Smoker    Types: Cigarettes    Quit date: 12/08/1990  . Smokeless tobacco: Former Systems developer    Types: Chew    Quit date: 09/13/2014  . Alcohol use No  . Drug use: No  . Sexual activity: Not on file   Other Topics Concern  . Not on file   Social History Narrative  . No narrative on file    Review of Systems: See HPI, otherwise negative ROS  Physical Exam: BP (!) 131/57   Pulse 66   Temp 97.8 F (36.6 C) (Oral)   Ht 5\' 6"  (1.676 m)   Wt 145 lb 3.2 oz (65.9 kg)   BMI 23.44 kg/m  General:   Frail appearing elderly gentleman who ambulates with a walker. Wearing sunglasses. He is significantly hard of hearing. He accompanied by one of his caregivers, Adibi Eyes:  Sclera clear, no icterus.   Conjunctiva pink. Neck:  Supple; no  masses or thyromegaly. No significant cervical adenopathy. Lungs:  Clear throughout to auscultation.   No wheezes, crackles, or rhonchi. No acute distress. Heart:  Regular rate and rhythm with 2/6 SEM.; . Abdomen: Non-distended, normal bowel sounds.  Soft and nontender without appreciable mass or hepatosplenomegaly.  Extremities:  Without clubbing or edema.  Impression: Pleasant 82 year old gentleman significantly hard of hearing - continues to complain of dysphagia. It sounds more esophageal rather than oropharyngeal in origin.  He certainly may have an oropharyngeal component as well.  Recent barium pill esophagram demonstrated no structural abnormality i.e. no Zenker's, etc. We know he has a history of a Schatzki's ring seen on previous EGDs and his dysphagia has improved after dilation.  He has asked to get his esophagus stretched again. This is not unreasonable.  He is reported to be on Plavix. We can safely perform conservative esophageal dilations while on Plavix. However, I would prefer for him not be on Plavix for the procedure.  We'll check with the prescriber to see if he can come off Plavix 5 days prior to the procedure.   Recommendations:  I have offered the patient an EGD with esophageal dilation as feasible/appropriate. We'll see if we can get him off Plavix for 5 days prior to the procedure.The risks, benefits, limitations, alternatives and imponderables have been reviewed with the patient. Potential for esophageal dilation, biopsy, etc. have also been reviewed.  Questions have been answered. Patient seems to understand.  Given his frailty and polypharmacy, will enlist the help of anesthesia for sedation.  He may benefit from a speech swallowing evaluation in the near future as well.      Notice: This dictation was prepared with Dragon dictation along with smaller phrase technology. Any transcriptional errors that result from this process are unintentional and may not be  corrected upon review.

## 2016-11-07 NOTE — Telephone Encounter (Signed)
Patient was step up for EGD/ED with PROPOFOL on 11/10/16. I talked to his daughter Kralicek) and she said that he could not make because he has another appointment. I don't know when we could get him in the coming week.

## 2016-11-10 NOTE — Telephone Encounter (Signed)
I have sent an e-mail to Dr.Berry.

## 2016-11-11 ENCOUNTER — Other Ambulatory Visit: Payer: Self-pay

## 2016-11-11 DIAGNOSIS — R1310 Dysphagia, unspecified: Secondary | ICD-10-CM

## 2016-11-11 NOTE — Telephone Encounter (Signed)
PER Dr. Gwenlyn Found:  OK to interrupt anti platelet Therapy for EGD   JJB

## 2016-11-11 NOTE — Telephone Encounter (Signed)
Pt is set up for EGD with PROPOFOL on 11/20/16. Daughter is aware

## 2016-11-11 NOTE — Telephone Encounter (Signed)
LMOM for daughter to call me back

## 2016-11-12 ENCOUNTER — Ambulatory Visit: Payer: Medicare Other | Admitting: Gastroenterology

## 2016-11-13 NOTE — Patient Instructions (Signed)
[name]  11/13/2016     @PREFPERIOPPHARMACY @   Your procedure is scheduled on 11/20/2016.  Report to [facility] at 7:00 A.M.  Call this number if you have problems the morning of surgery:  [phone]   Remember:  Do not eat food or drink liquids after midnight.  Take these medicines the morning of surgery with A SIP OF WATER : Xanax, Zyrtec, Prevacid, Toprol, Ultram and Flomax    Esophagogastroduodenoscopy Introduction Esophagogastroduodenoscopy (EGD) is a procedure to examine the lining of the esophagus, stomach, and first part of the small intestine (duodenum). This procedure is done to check for problems such as inflammation, bleeding, ulcers, or growths. During this procedure, a long, flexible, lighted tube with a camera attached (endoscope) is inserted down the throat. Tell a health care provider about:  Any allergies you have.  All medicines you are taking, including vitamins, herbs, eye drops, creams, and over-the-counter medicines.  Any problems you or family members have had with anesthetic medicines.  Any blood disorders you have.  Any surgeries you have had.  Any medical conditions you have.  Whether you are pregnant or may be pregnant. What are the risks? Generally, this is a safe procedure. However, problems may occur, including:  Infection.  Bleeding.  A tear (perforation) in the esophagus, stomach, or duodenum.  Trouble breathing.  Excessive sweating.  Spasms of the larynx.  A slowed heartbeat.  Low blood pressure. What happens before the procedure?  Follow instructions from your health care provider about eating or drinking restrictions.  Ask your health care provider about:  Changing or stopping your regular medicines. This is especially important if you are taking diabetes medicines or blood thinners.  Taking medicines such as aspirin and ibuprofen. These medicines can thin your blood. Do not take these medicines before your  procedure if your health care provider instructs you not to.  Plan to have someone take you home after the procedure.  If you wear dentures, be ready to remove them before the procedure. What happens during the procedure?  To reduce your risk of infection, your health care team will wash or sanitize their hands.  An IV tube will be put in a vein in your hand or arm. You will get medicines and fluids through this tube.  You will be given one or more of the following:  A medicine to help you relax (sedative).  A medicine to numb the area (local anesthetic). This medicine may be sprayed into your throat. It will make you feel more comfortable and keep you from gagging or coughing during the procedure.  A medicine for pain.  A mouth guard may be placed in your mouth to protect your teeth and to keep you from biting on the endoscope.  You will be asked to lie on your left side.  The endoscope will be lowered down your throat into your esophagus, stomach, and duodenum.  Air will be put into the endoscope. This will help your health care provider see better.  The lining of your esophagus, stomach, and duodenum will be examined.  Your health care provider may:  Take a tissue sample so it can be looked at in a lab (biopsy).  Remove growths.  Remove objects (foreign bodies) that are stuck.  Treat any bleeding with medicines or other devices that stop tissue from bleeding.  Widen (dilate) or stretch narrowed areas of your esophagus and stomach.  The endoscope will be taken out. The procedure may vary  among health care providers and hospitals. What happens after the procedure?  Your blood pressure, heart rate, breathing rate, and blood oxygen level will be monitored often until the medicines you were given have worn off.  Do not eat or drink anything until the numbing medicine has worn off and your gag reflex has returned. This information is not intended to replace advice given  to you by your health care provider. Make sure you discuss any questions you have with your health care provider. Document Released: 03/27/2005 Document Revised: 05/01/2016 Document Reviewed: 10/18/2015  2017 Elsevier   Do not wear jewelry, make-up or nail polish.  Do not wear lotions, powders, or perfumes, or deoderant.  Do not shave 48 hours prior to surgery.  Men may shave face and neck.  Do not bring valuables to the hospital.  [facility] is not responsible for any belongings or valuables.  Contacts, dentures or bridgework may not be worn into surgery.  Leave your suitcase in the car.  After surgery it may be brought to your room.  For patients admitted to the hospital, discharge time will be determined by your treatment team.  Patients discharged the day of surgery will not be allowed to drive home.   Name and phone number of your driver:   family Special instructions:  n/a  Please read over the following fact sheets that you were given. Care and Recovery After Surgery

## 2016-11-17 ENCOUNTER — Encounter (HOSPITAL_COMMUNITY): Payer: Self-pay

## 2016-11-17 ENCOUNTER — Encounter (HOSPITAL_COMMUNITY)
Admission: RE | Admit: 2016-11-17 | Discharge: 2016-11-17 | Disposition: A | Discharge status: Home / Self Care | Payer: Medicare Other | Source: Ambulatory Visit | Attending: Internal Medicine | Admitting: Internal Medicine

## 2016-11-17 DIAGNOSIS — R1310 Dysphagia, unspecified: Secondary | ICD-10-CM | POA: Insufficient documentation

## 2016-11-17 DIAGNOSIS — I739 Peripheral vascular disease, unspecified: Secondary | ICD-10-CM | POA: Diagnosis not present

## 2016-11-17 DIAGNOSIS — Z7902 Long term (current) use of antithrombotics/antiplatelets: Secondary | ICD-10-CM | POA: Diagnosis not present

## 2016-11-17 DIAGNOSIS — Z79899 Other long term (current) drug therapy: Secondary | ICD-10-CM | POA: Diagnosis not present

## 2016-11-17 DIAGNOSIS — Z87891 Personal history of nicotine dependence: Secondary | ICD-10-CM | POA: Diagnosis not present

## 2016-11-17 DIAGNOSIS — I10 Essential (primary) hypertension: Secondary | ICD-10-CM | POA: Diagnosis not present

## 2016-11-17 DIAGNOSIS — Z885 Allergy status to narcotic agent status: Secondary | ICD-10-CM | POA: Diagnosis not present

## 2016-11-17 DIAGNOSIS — E785 Hyperlipidemia, unspecified: Secondary | ICD-10-CM | POA: Diagnosis not present

## 2016-11-17 DIAGNOSIS — Z01812 Encounter for preprocedural laboratory examination: Secondary | ICD-10-CM | POA: Insufficient documentation

## 2016-11-17 DIAGNOSIS — K210 Gastro-esophageal reflux disease with esophagitis: Secondary | ICD-10-CM | POA: Diagnosis not present

## 2016-11-17 DIAGNOSIS — Z85828 Personal history of other malignant neoplasm of skin: Secondary | ICD-10-CM | POA: Diagnosis not present

## 2016-11-17 DIAGNOSIS — I2510 Atherosclerotic heart disease of native coronary artery without angina pectoris: Secondary | ICD-10-CM | POA: Diagnosis not present

## 2016-11-17 DIAGNOSIS — Z7982 Long term (current) use of aspirin: Secondary | ICD-10-CM | POA: Diagnosis not present

## 2016-11-17 DIAGNOSIS — H3530 Unspecified macular degeneration: Secondary | ICD-10-CM | POA: Diagnosis not present

## 2016-11-17 DIAGNOSIS — Z955 Presence of coronary angioplasty implant and graft: Secondary | ICD-10-CM | POA: Diagnosis not present

## 2016-11-17 DIAGNOSIS — Z8249 Family history of ischemic heart disease and other diseases of the circulatory system: Secondary | ICD-10-CM | POA: Diagnosis not present

## 2016-11-17 DIAGNOSIS — K222 Esophageal obstruction: Secondary | ICD-10-CM | POA: Diagnosis not present

## 2016-11-17 DIAGNOSIS — I714 Abdominal aortic aneurysm, without rupture: Secondary | ICD-10-CM | POA: Diagnosis not present

## 2016-11-17 DIAGNOSIS — K224 Dyskinesia of esophagus: Secondary | ICD-10-CM | POA: Diagnosis not present

## 2016-11-17 DIAGNOSIS — K449 Diaphragmatic hernia without obstruction or gangrene: Secondary | ICD-10-CM | POA: Diagnosis not present

## 2016-11-17 DIAGNOSIS — M1990 Unspecified osteoarthritis, unspecified site: Secondary | ICD-10-CM | POA: Diagnosis not present

## 2016-11-17 DIAGNOSIS — Z809 Family history of malignant neoplasm, unspecified: Secondary | ICD-10-CM | POA: Diagnosis not present

## 2016-11-17 DIAGNOSIS — Z833 Family history of diabetes mellitus: Secondary | ICD-10-CM | POA: Diagnosis not present

## 2016-11-17 DIAGNOSIS — Z836 Family history of other diseases of the respiratory system: Secondary | ICD-10-CM | POA: Diagnosis not present

## 2016-11-17 DIAGNOSIS — D649 Anemia, unspecified: Secondary | ICD-10-CM | POA: Diagnosis not present

## 2016-11-17 NOTE — Pre-Procedure Instructions (Signed)
Spoke with patient's daughter, jacier bayha concerning patient's time of arrival day of procedure, as well as NPO after midnight, no solid food after 1800 the day before procedure. Also informed her that patient could not be left alone for 24 hours after procedure. She verbalized understanding of all information. She also states that patient's last done of plavix was on 11/14/2016.

## 2016-11-17 NOTE — Pre-Procedure Instructions (Signed)
Patient has documented dysphagia and office note states he is possibly having esophageal dilation but the consent does not state that. Spoke with Dr Gala Romney who confirms possible esophageal dilation with procedure. Consent changed and signed to reflect this.

## 2016-11-20 ENCOUNTER — Encounter (HOSPITAL_COMMUNITY)
Admission: RE | Disposition: A | Discharge status: Home Health | Payer: Self-pay | Source: Ambulatory Visit | Attending: Internal Medicine

## 2016-11-20 ENCOUNTER — Ambulatory Visit (HOSPITAL_COMMUNITY): Payer: Medicare Other | Admitting: Anesthesiology

## 2016-11-20 ENCOUNTER — Ambulatory Visit (HOSPITAL_COMMUNITY)
Admission: RE | Admit: 2016-11-20 | Discharge: 2016-11-20 | Disposition: A | Discharge status: Home Health | Payer: Medicare Other | Source: Ambulatory Visit | Attending: Internal Medicine | Admitting: Internal Medicine

## 2016-11-20 ENCOUNTER — Encounter (HOSPITAL_COMMUNITY): Payer: Self-pay

## 2016-11-20 DIAGNOSIS — Z87891 Personal history of nicotine dependence: Secondary | ICD-10-CM | POA: Insufficient documentation

## 2016-11-20 DIAGNOSIS — Z833 Family history of diabetes mellitus: Secondary | ICD-10-CM | POA: Insufficient documentation

## 2016-11-20 DIAGNOSIS — K209 Esophagitis, unspecified: Secondary | ICD-10-CM | POA: Diagnosis not present

## 2016-11-20 DIAGNOSIS — I739 Peripheral vascular disease, unspecified: Secondary | ICD-10-CM | POA: Insufficient documentation

## 2016-11-20 DIAGNOSIS — Z7982 Long term (current) use of aspirin: Secondary | ICD-10-CM | POA: Insufficient documentation

## 2016-11-20 DIAGNOSIS — Z85828 Personal history of other malignant neoplasm of skin: Secondary | ICD-10-CM | POA: Insufficient documentation

## 2016-11-20 DIAGNOSIS — I714 Abdominal aortic aneurysm, without rupture: Secondary | ICD-10-CM | POA: Insufficient documentation

## 2016-11-20 DIAGNOSIS — R1310 Dysphagia, unspecified: Secondary | ICD-10-CM | POA: Diagnosis not present

## 2016-11-20 DIAGNOSIS — M1990 Unspecified osteoarthritis, unspecified site: Secondary | ICD-10-CM | POA: Insufficient documentation

## 2016-11-20 DIAGNOSIS — Z955 Presence of coronary angioplasty implant and graft: Secondary | ICD-10-CM | POA: Insufficient documentation

## 2016-11-20 DIAGNOSIS — Z836 Family history of other diseases of the respiratory system: Secondary | ICD-10-CM | POA: Insufficient documentation

## 2016-11-20 DIAGNOSIS — Z7902 Long term (current) use of antithrombotics/antiplatelets: Secondary | ICD-10-CM | POA: Insufficient documentation

## 2016-11-20 DIAGNOSIS — H3530 Unspecified macular degeneration: Secondary | ICD-10-CM | POA: Insufficient documentation

## 2016-11-20 DIAGNOSIS — K210 Gastro-esophageal reflux disease with esophagitis: Secondary | ICD-10-CM | POA: Insufficient documentation

## 2016-11-20 DIAGNOSIS — K224 Dyskinesia of esophagus: Secondary | ICD-10-CM | POA: Diagnosis not present

## 2016-11-20 DIAGNOSIS — D649 Anemia, unspecified: Secondary | ICD-10-CM | POA: Insufficient documentation

## 2016-11-20 DIAGNOSIS — I2510 Atherosclerotic heart disease of native coronary artery without angina pectoris: Secondary | ICD-10-CM | POA: Insufficient documentation

## 2016-11-20 DIAGNOSIS — K449 Diaphragmatic hernia without obstruction or gangrene: Secondary | ICD-10-CM | POA: Insufficient documentation

## 2016-11-20 DIAGNOSIS — Z885 Allergy status to narcotic agent status: Secondary | ICD-10-CM | POA: Insufficient documentation

## 2016-11-20 DIAGNOSIS — Z8249 Family history of ischemic heart disease and other diseases of the circulatory system: Secondary | ICD-10-CM | POA: Insufficient documentation

## 2016-11-20 DIAGNOSIS — K222 Esophageal obstruction: Secondary | ICD-10-CM | POA: Diagnosis not present

## 2016-11-20 DIAGNOSIS — Z809 Family history of malignant neoplasm, unspecified: Secondary | ICD-10-CM | POA: Insufficient documentation

## 2016-11-20 DIAGNOSIS — I10 Essential (primary) hypertension: Secondary | ICD-10-CM | POA: Insufficient documentation

## 2016-11-20 DIAGNOSIS — E785 Hyperlipidemia, unspecified: Secondary | ICD-10-CM | POA: Insufficient documentation

## 2016-11-20 DIAGNOSIS — Z79899 Other long term (current) drug therapy: Secondary | ICD-10-CM | POA: Insufficient documentation

## 2016-11-20 HISTORY — PX: ESOPHAGOGASTRODUODENOSCOPY (EGD) WITH PROPOFOL: SHX5813

## 2016-11-20 HISTORY — PX: MALONEY DILATION: SHX5535

## 2016-11-20 SURGERY — ESOPHAGOGASTRODUODENOSCOPY (EGD) WITH PROPOFOL
Anesthesia: Monitor Anesthesia Care

## 2016-11-20 MED ORDER — PROPOFOL 10 MG/ML IV BOLUS
INTRAVENOUS | Status: DC | PRN
  Administered 2016-11-20 (×2): 10 mg via INTRAVENOUS

## 2016-11-20 MED ORDER — LIDOCAINE VISCOUS 2 % MT SOLN
15.0000 mL | Freq: Once | OROMUCOSAL | Status: AC
  Administered 2016-11-20: 15 mL via OROMUCOSAL

## 2016-11-20 MED ORDER — CHLORHEXIDINE GLUCONATE CLOTH 2 % EX PADS: 6.0000 | MEDICATED_PAD | Freq: Once | CUTANEOUS | Status: DC

## 2016-11-20 MED ORDER — MIDAZOLAM HCL 2 MG/2ML IJ SOLN
INTRAMUSCULAR | Status: AC
Start: 2016-11-20 — End: 2016-11-20
  Filled 2016-11-20: qty 2

## 2016-11-20 MED ORDER — LACTATED RINGERS IV SOLN
INTRAVENOUS | Status: DC
  Administered 2016-11-20: 08:00:00 via INTRAVENOUS

## 2016-11-20 MED ORDER — PROPOFOL 500 MG/50ML IV EMUL
INTRAVENOUS | Status: DC | PRN
  Administered 2016-11-20: 25 ug/kg/min via INTRAVENOUS

## 2016-11-20 MED ORDER — FENTANYL CITRATE (PF) 100 MCG/2ML IJ SOLN
25.0000 ug | Freq: Once | INTRAMUSCULAR | Status: AC
  Administered 2016-11-20: 25 ug via INTRAVENOUS

## 2016-11-20 MED ORDER — MIDAZOLAM HCL 2 MG/2ML IJ SOLN
1.0000 mg | INTRAMUSCULAR | Status: DC | PRN
  Administered 2016-11-20 (×2): 1 mg via INTRAVENOUS

## 2016-11-20 MED ORDER — FENTANYL CITRATE (PF) 100 MCG/2ML IJ SOLN
INTRAMUSCULAR | Status: AC
  Filled 2016-11-20: qty 2

## 2016-11-20 MED ORDER — LIDOCAINE VISCOUS 2 % MT SOLN
OROMUCOSAL | Status: AC
  Filled 2016-11-20: qty 15

## 2016-11-20 NOTE — Transfer of Care (Signed)
Immediate Anesthesia Transfer of Care Note  Patient: [name]  Procedure(s) Performed: Procedure(s) with comments: ESOPHAGOGASTRODUODENOSCOPY (EGD) WITH PROPOFOL (N/A) - [place]  Patient Location: PACU  Anesthesia Type:MAC  Level of Consciousness: awake and patient cooperative  Airway & Oxygen Therapy: Patient Spontanous Breathing and non-rebreather face mask  Post-op Assessment: Report given to RN and Post -op Vital signs reviewed and stable  Post vital signs: Reviewed and stable  Last Vitals:  Vitals:   11/20/16 0802  BP: (!) 184/74  Pulse: (!) 59  Resp: 20  Temp: 36.6 C    Last Pain:  Vitals:   11/20/16 0802  TempSrc: Oral  PainSc: 0-No pain      Patients Stated Pain Goal: 3 ([id number])  Complications: No apparent anesthesia complications

## 2016-11-20 NOTE — Discharge Instructions (Addendum)
EGD Discharge instructions Please read the instructions outlined below and refer to this sheet in the next few weeks. These discharge instructions provide you with general information on caring for yourself after you leave the hospital. Your doctor may also give you specific instructions. While your treatment has been planned according to the most current medical practices available, unavoidable complications occasionally occur. If you have any problems or questions after discharge, please call your doctor. ACTIVITY  You may resume your regular activity but move at a slower pace for the next 24 hours.   Take frequent rest periods for the next 24 hours.   Walking will help expel (get rid of) the air and reduce the bloated feeling in your abdomen.   No driving for 24 hours (because of the anesthesia (medicine) used during the test).   You may shower.   Do not sign any important legal documents or operate any machinery for 24 hours (because of the anesthesia used during the test).  NUTRITION  Drink plenty of fluids.   You may resume your normal diet.   Begin with a light meal and progress to your normal diet.   Avoid alcoholic beverages for 24 hours or as instructed by your caregiver.  MEDICATIONS  You may resume your normal medications unless your caregiver tells you otherwise.  WHAT YOU CAN EXPECT TODAY  You may experience abdominal discomfort such as a feeling of fullness or gas pains.  FOLLOW-UP  Your doctor will discuss the results of your test with you.  SEEK IMMEDIATE MEDICAL ATTENTION IF ANY OF THE FOLLOWING OCCUR:  Excessive nausea (feeling sick to your stomach) and/or vomiting.   Severe abdominal pain and distention (swelling).   Trouble swallowing.   Temperature over 101 F (37.8 C).   Rectal bleeding or vomiting of blood.    Prevacid 30mg  once daily  Office visit with [place] in 3 months Appointment with Anselmo Rod March 14 at 11:00AM at Dr. Roseanne Kaufman  office

## 2016-11-20 NOTE — Op Note (Signed)
[facility] Patient Name: [name] Procedure Date: 11/20/2016 8:27 AM MRN: [id number] Date of Birth: [birth year] Attending MD: Norvel Richards , MD CSN: [id number] Age: 82 Admit Type: Outpatient Procedure:                Upper GI endoscopy Indications:              Dysphagia Providers:                Norvel Richards, MD, Jeanann Lewandowsky. Sharon Seller, RN,                            Isabella Stalling, Technician Referring MD:              Medicines:                Midazolam mg IV, Propofol per Anesthesia Complications:            No immediate complications. Estimated blood loss:                            None. Estimated Blood Loss:     Estimated blood loss: 8 mL . Procedure:                Pre-Anesthesia Assessment:                           - Prior to the procedure, a History and Physical                            was performed, and patient medications and                            allergies were reviewed. The patient's tolerance of                            previous anesthesia was also reviewed. The risks                            and benefits of the procedure and the sedation                            options and risks were discussed with the patient.                            All questions were answered, and informed consent                            was obtained. Prior Anticoagulants: The patient has                            taken no previous anticoagulant or antiplatelet                            agents. ASA Grade Assessment: III - A patient with  severe systemic disease. After reviewing the risks                            and benefits, the patient was deemed in                            satisfactory condition to undergo the procedure.                           After obtaining informed consent, the endoscope was                            passed under direct vision. Throughout the                            procedure, the patient's blood  pressure, pulse, and                            oxygen saturations were monitored continuously. The                            [id number]) scope was introduced through the                            and advanced to the second part of duodenum. The                            upper GI endoscopy was accomplished without                            difficulty. The patient tolerated the procedure                            well. Scope In: 8:55:16 AM Scope Out: 9:04:02 AM Total Procedure Duration: 0 hours 8 minutes 46 seconds  Findings:      LA Grade A (one or more mucosal breaks less than 5 mm, not extending       between tops of 2 mucosal folds) esophagitis was found 35 to 36 cm from       the incisors. Hiatal hernia. No Barretts' esophagus seen. The scope was       withdrawn. Dilation was performed with a Maloney dilator with no       resistance at 56 Fr. The dilation site was examined and showed mild       improvement in luminal narrowing. minimal oozing with this manuver.      A medium-sized hiatal hernia was present.      The cardia and gastric fundus were normal on retroflexion. Patent       pylorus.      The duodenal bulb and second portion of the duodenum were normal. Impression:               - LA Grade A esophagitis. Schatzki's ring. Dilated.                           - Medium-sized hiatal hernia.                           -  Normal duodenal bulb and second portion of the                            duodenum.                           - No specimens collected. Moderate Sedation:      Moderate (conscious) sedation was personally administered by an       anesthesia professional. The following parameters were monitored: oxygen       saturation, heart rate, blood pressure, respiratory rate, EKG, adequacy       of pulmonary ventilation, and response to care. Total physician       intraservice time was 20 minutes. Recommendation:           - Patient has a contact number available  for                            emergencies. The signs and symptoms of potential                            delayed complications were discussed with the                            patient. Return to normal activities tomorrow.                            Written discharge instructions were provided to the                            patient.                           - Resume previous diet.                           - Continue present medications.                           - Patient has a contact number available for                            emergencies. The signs and symptoms of potential                            delayed complications were discussed with the                            patient. Return to normal activities tomorrow.                            Written discharge instructions were provided to the                            patient.                           -  Resume previous diet.                           - Continue present medications.                           - No repeat upper endoscopy.                           - Return to my office in 3 months. Procedure Code(s):        --- Professional ---                           [phone], Esophagogastroduodenoscopy, flexible,                            transoral; diagnostic, including collection of                            specimen(s) by brushing or washing, when performed                            (separate procedure) Diagnosis Code(s):        --- Professional ---                           K20.9, Esophagitis, unspecified                           K22.2, Esophageal obstruction                           R13.10, Dysphagia, unspecified CPT copyright 2016 American Medical Association. All rights reserved. The codes documented in this report are preliminary and upon coder review may  be revised to meet current compliance requirements. Cristopher Estimable. Porsha Skilton, MD Norvel Richards, MD 11/20/2016 9:46:14 AM This report has been signed  electronically. Number of Addenda: 0

## 2016-11-20 NOTE — H&P (View-Only) (Signed)
Primary Care Physician:  Philis Fendt, MD Primary Gastroenterologist:  Dr. Gala Romney  Pre-Procedure History & Physical: HPI:  [name] is a 82 y.o. male here for dysphagia. Patient describes having difficulties with meat and bread going down behind his breast bone.  From time to time, he feels a lump in his throat as well.  Patient wants his esophagus stretched again. He does have a history of a Schatzki's ring and has responded to Broaddus dilation in the past. Barium pill esophagram done month before last demonstrated esophageal dysmotility along with prolonged retention of barium pill in his mouth before he actually swallowed it. No Zenker's diverticulum.  Barium pill passed without delay.  Patient denies reflux symptoms-reportedly on Prevacid 15 mg daily.  Patient also taking Plavix.  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 This was further evaluated by endoscopic ultrasound. No evidence of malignancy found.  Past Medical History:  Diagnosis Date  . AAA (abdominal aortic aneurysm) ([place])   . Anemia   . Anemia   . Arthritis   . Arthritis   . CAD (coronary artery disease)   . Cancer ([place]) 2016   skin caner: Forehead  . GERD (gastroesophageal reflux disease)   . Hiatal hernia   . Hyperlipidemia   . Hypertension   . Macular degeneration   . Peripheral vascular disease ([place])   . Trouble swallowing     Past Surgical History:  Procedure Laterality Date  . ANGIOPLASTY / STENTING ILIAC  03-2011   Right Iliac - Gore Stent graft   . BACK SURGERY  09-05-2011    . COLONOSCOPY  08/2009   Taylyn Brame: report unavailable, two tubular adenomas and acute ileitis on path  . CORONARY ANGIOPLASTY WITH STENT PLACEMENT    . ESOPHAGOGASTRODUODENOSCOPY  2008   Sebastion Jun: Critical Schazki ring s/p dilation, hiatal hernia  . ESOPHAGOGASTRODUODENOSCOPY  09/2005   Jamilia Jacques: S-shaped distal esophageal erosion, middle to distal esopahgus s/p multiple biopsies  (unavailable)  . ESOPHAGOGASTRODUODENOSCOPY  06/2005   Amaryllis Malmquist: solitary mid esophageal lesion s/p bx (low grade dysplasia), prominent schatzki ring, large hh  . ESOPHAGOGASTRODUODENOSCOPY  11/2009   Geoge Lawrance: Schatzki ring s/p dilation  . EUS     Mishra:  . SPINE SURGERY    . TONSILLECTOMY      Prior to Admission medications   Medication Sig Start Date End Date Taking? Authorizing Provider  alfuzosin (UROXATRAL) 10 MG 24 hr tablet Take 10 mg by mouth daily.     Yes Historical Provider, MD  ALPRAZolam (XANAX) 0.25 MG tablet Take 0.25 mg by mouth 2 (two) times daily as needed for sleep or anxiety.    Yes Historical Provider, MD  aspirin 325 MG EC tablet Take 81 mg by mouth daily.    Yes Historical Provider, MD  atorvastatin (LIPITOR) 20 MG tablet Take 20 mg by mouth daily.     Yes Historical Provider, MD  cetirizine (ZYRTEC) 10 MG tablet Take 10 mg by mouth daily.   Yes Historical Provider, MD  clopidogrel (PLAVIX) 75 MG tablet Take 75 mg by mouth daily.     Yes Historical Provider, MD  diclofenac sodium (VOLTAREN) 1 % GEL Apply 4 g topically 4 (four) times daily.   Yes Historical Provider, MD  ferrous sulfate 325 (65 FE) MG tablet Take 325 mg by mouth daily with breakfast.   Yes Historical Provider, MD  lansoprazole (PREVACID) 15 MG capsule Take 15 mg by mouth daily at 12 noon.  Yes Historical Provider, MD  meclizine (ANTIVERT) 25 MG tablet Take 25 mg by mouth every 6 (six) hours as needed for dizziness.   Yes Historical Provider, MD  metoprolol succinate (TOPROL-XL) 25 MG 24 hr tablet Take 12.5 mg by mouth 2 (two) times daily.  11/21/13  Yes Historical Provider, MD  Multiple Vitamin (MULTIVITAMIN PO) Take 1 tablet by mouth daily.    Yes Historical Provider, MD  Multiple Vitamins-Minerals (ICAPS MV PO) Take 1 capsule by mouth at bedtime.    Yes Historical Provider, MD  polyethylene glycol powder (GLYCOLAX/MIRALAX) powder Take 17 g by mouth at bedtime.   Yes Historical Provider, MD  pregabalin  (LYRICA) 50 MG capsule Take 50 mg by mouth at bedtime.   Yes Historical Provider, MD  Psyllium (METAMUCIL PO) Take 1 scoop by mouth daily.   Yes Historical Provider, MD  sertraline (ZOLOFT) 25 MG tablet Take 25 mg by mouth daily.   Yes Historical Provider, MD  traMADol (ULTRAM) 50 MG tablet Take 50 mg by mouth 2 (two) times daily as needed.    Yes Historical Provider, MD  levofloxacin (LEVAQUIN) 500 MG tablet Take 1 tablet (500 mg total) by mouth daily. Patient not taking: Reported on 11/07/2016 10/30/16   Erline Hau, MD    Allergies as of 11/07/2016 - Review Complete 11/07/2016  Allergen Reaction Noted  . Hydrocodone Anaphylaxis and Nausea And Vomiting   . Oxycodone Nausea And Vomiting 12/12/2012    Family History  Problem Relation Age of Onset  . Heart failure Mother   . Diabetes Mother   . Heart disease Mother   . Pneumonia Father   . Cancer Brother   . Diabetes Brother   . Heart disease Brother     Social History   Social History  . Marital status: Widowed    Spouse name: N/A  . Number of children: N/A  . Years of education: N/A   Occupational History  . Not on file.   Social History Main Topics  . Smoking status: Former Smoker    Types: Cigarettes    Quit date: 12/08/1990  . Smokeless tobacco: Former Systems developer    Types: Chew    Quit date: 09/13/2014  . Alcohol use No  . Drug use: No  . Sexual activity: Not on file   Other Topics Concern  . Not on file   Social History Narrative  . No narrative on file    Review of Systems: See HPI, otherwise negative ROS  Physical Exam: BP (!) 131/57   Pulse 66   Temp 97.8 F (36.6 C) (Oral)   Ht 5\' 6"  (1.676 m)   Wt 145 lb 3.2 oz (65.9 kg)   BMI 23.44 kg/m  General:   Frail appearing elderly gentleman who ambulates with a walker. Wearing sunglasses. He is significantly hard of hearing. He accompanied by one of his caregivers, Adibi Eyes:  Sclera clear, no icterus.   Conjunctiva pink. Neck:  Supple; no  masses or thyromegaly. No significant cervical adenopathy. Lungs:  Clear throughout to auscultation.   No wheezes, crackles, or rhonchi. No acute distress. Heart:  Regular rate and rhythm with 2/6 SEM.; . Abdomen: Non-distended, normal bowel sounds.  Soft and nontender without appreciable mass or hepatosplenomegaly.  Extremities:  Without clubbing or edema.  Impression: Pleasant 82 year old gentleman significantly hard of hearing - continues to complain of dysphagia. It sounds more esophageal rather than oropharyngeal in origin.  He certainly may have an oropharyngeal component as well.  Recent barium pill esophagram demonstrated no structural abnormality i.e. no Zenker's, etc. We know he has a history of a Schatzki's ring seen on previous EGDs and his dysphagia has improved after dilation.  He has asked to get his esophagus stretched again. This is not unreasonable.  He is reported to be on Plavix. We can safely perform conservative esophageal dilations while on Plavix. However, I would prefer for him not be on Plavix for the procedure.  We'll check with the prescriber to see if he can come off Plavix 5 days prior to the procedure.   Recommendations:  I have offered the patient an EGD with esophageal dilation as feasible/appropriate. We'll see if we can get him off Plavix for 5 days prior to the procedure.The risks, benefits, limitations, alternatives and imponderables have been reviewed with the patient. Potential for esophageal dilation, biopsy, etc. have also been reviewed.  Questions have been answered. Patient seems to understand.  Given his frailty and polypharmacy, will enlist the help of anesthesia for sedation.  He may benefit from a speech swallowing evaluation in the near future as well.      Notice: This dictation was prepared with Dragon dictation along with smaller phrase technology. Any transcriptional errors that result from this process are unintentional and may not be  corrected upon review.

## 2016-11-20 NOTE — Anesthesia Preprocedure Evaluation (Signed)
Anesthesia Evaluation  Patient identified by MRN, date of birth, ID band Patient awake    Reviewed: Allergy & Precautions, NPO status , Patient's Chart, lab work & pertinent test results  Airway Mallampati: II  TM Distance: >3 FB     Dental  (+) Poor Dentition   Pulmonary former smoker,    breath sounds clear to auscultation       Cardiovascular hypertension, + CAD, + Cardiac Stents and + Peripheral Vascular Disease (carotid, AAA)   Rhythm:Regular Rate:Normal     Neuro/Psych TIA   GI/Hepatic hiatal hernia, GERD  ,  Endo/Other    Renal/GU      Musculoskeletal   Abdominal   Peds  Hematology   Anesthesia Other Findings   Reproductive/Obstetrics                             Anesthesia Physical Anesthesia Plan  ASA: III  Anesthesia Plan: MAC   Post-op Pain Management:    Induction: Intravenous  Airway Management Planned: Simple Face Mask  Additional Equipment:   Intra-op Plan:   Post-operative Plan:   Informed Consent: I have reviewed the patients History and Physical, chart, labs and discussed the procedure including the risks, benefits and alternatives for the proposed anesthesia with the patient or authorized representative who has indicated his/her understanding and acceptance.     Plan Discussed with:   Anesthesia Plan Comments:         Anesthesia Quick Evaluation

## 2016-11-20 NOTE — Anesthesia Procedure Notes (Signed)
Procedure Name: MAC Date/Time: 11/20/2016 8:43 AM Performed by: Vista Deck Pre-anesthesia Checklist: Patient identified, Emergency Drugs available, Suction available, Timeout performed and Patient being monitored Patient Re-evaluated:Patient Re-evaluated prior to inductionOxygen Delivery Method: Non-rebreather mask

## 2016-11-20 NOTE — Anesthesia Postprocedure Evaluation (Signed)
Anesthesia Post Note  Patient: [name]  Procedure(s) Performed: Procedure(s) (LRB): ESOPHAGOGASTRODUODENOSCOPY (EGD) WITH PROPOFOL (N/A) [place]  Patient location during evaluation: PACU Anesthesia Type: MAC Level of consciousness: awake and alert Pain management: satisfactory to patient Vital Signs Assessment: post-procedure vital signs reviewed and stable Respiratory status: spontaneous breathing Cardiovascular status: stable Anesthetic complications: no    Last Vitals:  Vitals:   11/20/16 0802  BP: (!) 184/74  Pulse: (!) 59  Resp: 20  Temp: 36.6 C    Last Pain:  Vitals:   11/20/16 0802  TempSrc: Oral  PainSc: 0-No pain                 Emylia Latella

## 2016-11-20 NOTE — Interval H&P Note (Signed)
History and Physical Interval Note:  11/20/2016 8:32 AM  [name]  has presented today for surgery, with the diagnosis of dysphagia  The various methods of treatment have been discussed with the patient and family. After consideration of risks, benefits and other options for treatment, the patient has consented to  Procedure(s) with comments: ESOPHAGOGASTRODUODENOSCOPY (EGD) WITH PROPOFOL (N/A) - 830 as a surgical intervention .  The patient's history has been reviewed, patient examined, no change in status, stable for surgery.  I have reviewed the patient's chart and labs.  Questions were answered to the patient's satisfaction.     [name]  No change; EGD/ED on plavix per plan.  The risks, benefits, limitations, alternatives and imponderables have been reviewed with the patient. Potential for esophageal dilation, biopsy, etc. have also been reviewed.  Questions have been answered. All parties agreeable.

## 2016-11-26 ENCOUNTER — Encounter (HOSPITAL_COMMUNITY): Payer: Self-pay | Admitting: Internal Medicine

## 2016-12-31 ENCOUNTER — Encounter: Payer: Self-pay | Admitting: Cardiovascular Disease

## 2016-12-31 ENCOUNTER — Ambulatory Visit (INDEPENDENT_AMBULATORY_CARE_PROVIDER_SITE_OTHER): Payer: Medicare Other | Admitting: Cardiovascular Disease

## 2016-12-31 VITALS — BP 134/52 | HR 61 | Ht 66.0 in | Wt 147.6 lb

## 2016-12-31 DIAGNOSIS — E7800 Pure hypercholesterolemia, unspecified: Secondary | ICD-10-CM | POA: Diagnosis not present

## 2016-12-31 DIAGNOSIS — I2510 Atherosclerotic heart disease of native coronary artery without angina pectoris: Secondary | ICD-10-CM

## 2016-12-31 DIAGNOSIS — Z9861 Coronary angioplasty status: Secondary | ICD-10-CM

## 2016-12-31 DIAGNOSIS — I10 Essential (primary) hypertension: Secondary | ICD-10-CM

## 2016-12-31 NOTE — Progress Notes (Signed)
12/31/2016 [name]   1918-11-14  [id number]  Primary Physician Philis Fendt, MD Primary Cardiologist: Lorretta Harp MD Renae Gloss  HPI:  [name] is a 83 year old widowed Caucasian male father of one daughter who is accompanying him today. He was formally patient of Dr. Lowella Gilmore. I am assumed his care. I last saw him in the office 12/11/15. He is retired from [employer] as a Teacher, early years/pre. His daughter lives next-door to him. The patient lives independently. He has a history of remote tobacco abuse, hypertension and hyperlipidemia. He has not had a heart attack or stroke. He does have known coronary artery disease status post RCA stenting using a Taxus drug-eluting stent by Dr. Rollene Fare back in 2006 with moderate circumflex disease and normal LV function. He had anegative Myoview stress test several years ago and denies chest pain or shortness of breath. He did have a history of abdominal aortic aneurysm and had endoluminal stent grafting by Dr. Sherren Mocha Early back in 2011. He also has asymptomatic moderate left internal carotid artery stenosis which we followed by duplex ultrasound. His most recent carotid Doppler performed 12/16/13 revealed moderate bilateral ICA stenosis.   Current Outpatient Prescriptions  Medication Sig Dispense Refill  . acetaminophen (TYLENOL) 500 MG tablet Take 500 mg by mouth every 6 (six) hours as needed for mild pain.    Marland Kitchen alfuzosin (UROXATRAL) 10 MG 24 hr tablet Take 10 mg by mouth every evening.     Marland Kitchen ALPRAZolam (XANAX) 0.25 MG tablet Take 0.25 mg by mouth 2 (two) times daily.     Marland Kitchen aspirin EC 81 MG tablet Take 81 mg by mouth daily.    Marland Kitchen atorvastatin (LIPITOR) 20 MG tablet Take 20 mg by mouth daily at 6 PM.     . cetirizine (ZYRTEC) 10 MG tablet Take 10 mg by mouth daily.    . clopidogrel (PLAVIX) 75 MG tablet Take 75 mg by mouth daily. Will stop prior to procedure    . diclofenac sodium (VOLTAREN) 1 % GEL Apply 4 g topically 2 (two)  times daily as needed (pain).     . ferrous sulfate 325 (65 FE) MG tablet Take 325 mg by mouth daily with breakfast.    . lansoprazole (PREVACID) 15 MG capsule Take 15 mg by mouth daily. 2 hours before meal    . meclizine (ANTIVERT) 25 MG tablet Take 25 mg by mouth every 6 (six) hours as needed for dizziness.    . metoprolol succinate (TOPROL-XL) 25 MG 24 hr tablet Take 12.5 mg by mouth 2 (two) times daily.     . Multiple Vitamin (MULTIVITAMIN PO) Take 1 tablet by mouth daily.     . Multiple Vitamins-Minerals (ICAPS MV PO) Take 1 capsule by mouth at bedtime.     . polyethylene glycol powder (GLYCOLAX/MIRALAX) powder Take 17 g by mouth every evening.     . pregabalin (LYRICA) 50 MG capsule Take 50 mg by mouth at bedtime.    . Psyllium (METAMUCIL PO) Take 1 scoop by mouth daily.    . tamsulosin (FLOMAX) 0.4 MG CAPS capsule Take 0.4 mg by mouth daily.     . traMADol (ULTRAM) 50 MG tablet Take 50 mg by mouth 2 (two) times daily as needed for moderate pain.      No current facility-administered medications for this visit.     Allergies  Allergen Reactions  . Hydrocodone Anaphylaxis and Nausea And Vomiting  . Oxycodone Nausea And Vomiting  Social History   Social History  . Marital status: Widowed    Spouse name: N/A  . Number of children: N/A  . Years of education: N/A   Occupational History  . Not on file.   Social History Main Topics  . Smoking status: Former Smoker    Types: Cigarettes    Quit date: 12/08/1990  . Smokeless tobacco: Former Systems developer    Types: Chew    Quit date: 09/13/2014  . Alcohol use No  . Drug use: No  . Sexual activity: Not on file   Other Topics Concern  . Not on file   Social History Narrative  . No narrative on file     Review of Systems: General: negative for chills, fever, night sweats or weight changes.  Cardiovascular: negative for chest pain, dyspnea on exertion, edema, orthopnea, palpitations, paroxysmal nocturnal dyspnea or shortness of  breath Dermatological: negative for rash Respiratory: negative for cough or wheezing Urologic: negative for hematuria Abdominal: negative for nausea, vomiting, diarrhea, bright red blood per rectum, melena, or hematemesis Neurologic: negative for visual changes, syncope, or dizziness All other systems reviewed and are otherwise negative except as noted above.    Blood pressure (!) 134/52, pulse 61, height 5\' 6"  (1.676 m), weight 147 lb 9.6 oz (67 kg).  General appearance: alert and no distress Neck: no adenopathy, no JVD, supple, symmetrical, trachea midline, thyroid not enlarged, symmetric, no tenderness/mass/nodules and Soft right carotid bruit Lungs: clear to auscultation bilaterally Heart: regular rate and rhythm, S1, S2 normal, no murmur, click, rub or gallop Extremities: extremities normal, atraumatic, no cyanosis or edema  EKG sinus rhythm at 61 with occasional PVCs. I personally reviewed this EKG  ASSESSMENT AND PLAN:   HYPERCHOLESTEROLEMIA History of hyperlipidemia on statin therapy followed by his PCP  Essential hypertension History of hypertension blood pressure measured at 134/52. He is on metoprolol. Continue current meds at current dosing  CAD S/P RCA stent '06 History of CAD status post RCA stenting using a Taxus drug-eluting stent by Dr. Rollene Fare in 2006 with moderate circumflex disease and normal LV function. He had negative Myoview stress test several years after that. He denies chest pain or shortness of breath.      Lorretta Harp MD FACP,FACC,FAHA, [facility] 12/31/2016 11:15 AM

## 2016-12-31 NOTE — Assessment & Plan Note (Signed)
History of CAD status post RCA stenting using a Taxus drug-eluting stent by Dr. Rollene Fare in 2006 with moderate circumflex disease and normal LV function. He had negative Myoview stress test several years after that. He denies chest pain or shortness of breath.

## 2016-12-31 NOTE — Patient Instructions (Signed)

## 2016-12-31 NOTE — Assessment & Plan Note (Signed)
History of hypertension blood pressure measured at 134/52. He is on metoprolol. Continue current meds at current dosing

## 2016-12-31 NOTE — Assessment & Plan Note (Signed)
History of hyperlipidemia on statin therapy followed by his PCP 

## 2017-01-23 ENCOUNTER — Ambulatory Visit (INDEPENDENT_AMBULATORY_CARE_PROVIDER_SITE_OTHER): Payer: Medicare Other

## 2017-01-23 ENCOUNTER — Encounter (INDEPENDENT_AMBULATORY_CARE_PROVIDER_SITE_OTHER): Payer: Self-pay | Admitting: Orthopedic Surgery

## 2017-01-23 ENCOUNTER — Ambulatory Visit (INDEPENDENT_AMBULATORY_CARE_PROVIDER_SITE_OTHER): Payer: Medicare Other | Admitting: Orthopedic Surgery

## 2017-01-23 DIAGNOSIS — M25561 Pain in right knee: Secondary | ICD-10-CM

## 2017-01-23 DIAGNOSIS — M25551 Pain in right hip: Secondary | ICD-10-CM

## 2017-01-23 DIAGNOSIS — Z9861 Coronary angioplasty status: Secondary | ICD-10-CM | POA: Diagnosis not present

## 2017-01-23 DIAGNOSIS — I2510 Atherosclerotic heart disease of native coronary artery without angina pectoris: Secondary | ICD-10-CM | POA: Diagnosis not present

## 2017-01-24 DIAGNOSIS — M25561 Pain in right knee: Secondary | ICD-10-CM

## 2017-01-24 MED ORDER — BUPIVACAINE HCL 0.25 % IJ SOLN
4.0000 mL | INTRAMUSCULAR | Status: AC | PRN
  Administered 2017-01-24: 4 mL via INTRA_ARTICULAR

## 2017-01-24 MED ORDER — LIDOCAINE HCL 1 % IJ SOLN
5.0000 mL | INTRAMUSCULAR | Status: AC | PRN
  Administered 2017-01-24: 5 mL

## 2017-01-24 MED ORDER — METHYLPREDNISOLONE ACETATE 40 MG/ML IJ SUSP
40.0000 mg | INTRAMUSCULAR | Status: AC | PRN
  Administered 2017-01-24: 40 mg via INTRA_ARTICULAR

## 2017-01-24 NOTE — Progress Notes (Signed)
Office Visit Note   Patient: [name]           Date of Birth: 1918-03-27           MRN: [id number] Visit Date: 01/23/2017 Requested by: Jeremy Ebbs, MD [address] [place], [place] [address] PCP: Jeremy Fendt, MD  Subjective: Chief Complaint  Patient presents with  . Right Knee - Pain  . Right Hip - Pain    HPI [name] is a 83 year old patient who ambulates with a walker.  He describes some right knee and hip pain.  He was walking at the [facility] Wednesday, 01/21/2017 when he had a sort of twisting event where his caregiver caught him.  He reports a mild increase in right knee pain and right hip pain.  He did have back surgery 2 years ago which helped him significantly.  He is taking tramadol for his symptoms.             Review of Systems All systems reviewed are negative as they relate to the chief complaint within the history of present illness.  Patient denies  fevers or chills.    Assessment & Plan: Visit Diagnoses:  1. Acute pain of right knee   2. Right hip pain     Plan: Impression is muscular strain in the hip region which may be referred pain from the back.  That's something that we can watch for now.  He has no groin pain with internal/external rotation of the leg so I don't think this is an occult fracture.  In regards to the knee at think he has an effusion in the knee with exacerbation of pre-existing arthritis.  That area is aspirated and injected today.  We will see him back as needed  Follow-Up Instructions: No Follow-up on file.   Orders:  Orders Placed This Encounter  Procedures  . XR HIP UNILAT W OR W/O PELVIS 1V RIGHT  . XR Knee 1-2 Views Right   No orders of the defined types were placed in this encounter.     Procedures: Large Joint Inj Date/Time: 01/24/2017 12:39 PM Performed by: [name] Authorized by: [name]   Consent Given by:  Patient Site marked: the procedure site was marked   Timeout: prior to  procedure the correct patient, procedure, and site was verified   Indications:  Pain, joint swelling and diagnostic evaluation Location:  Knee Site:  R knee Prep: patient was prepped and draped in usual sterile fashion   Needle Size:  18 G Needle Length:  1.5 inches Approach:  Superolateral Ultrasound Guidance: No   Fluoroscopic Guidance: No   Arthrogram: No   Medications:  5 mL lidocaine 1 %; 4 mL bupivacaine 0.25 %; 40 mg methylPREDNISolone acetate 40 MG/ML Aspiration Attempted: Yes   Aspirate amount (mL):  60 Aspirate:  Yellow Patient tolerance:  Patient tolerated the procedure well with no immediate complications     Clinical Data: No additional findings.  Objective: Vital Signs: There were no vitals taken for this visit.  Physical Exam   Constitutional: Patient appears well-developed HEENT:  Head: Normocephalic Eyes:EOM are normal Neck: Normal range of motion Cardiovascular: Normal rate Pulmonary/chest: Effort normal Neurologic: Patient is alert Skin: Skin is warm Psychiatric: Patient has normal mood and affect  Patient is hard of hearing  Ortho Exam orthopedic exam demonstrates ability to weight-bear on both legs.  He does walk with a walker.  He's got an effusion in the right knee no  effusion in the left knee.  Extensor mechanism is intact bilaterally.  Pedal pulses are trace palpable bilaterally.  Collaterals and cruciates stable on the right-hand side.  Extensor mechanism is intact.  He has lateral greater than medial joint line tenderness on the right knee.  No trochanteric tenderness is noted.  No bruising is noted in the right leg region.  Specialty Comments:  No specialty comments available.  Imaging: No results found.   PMFS History: Patient Active Problem List   Diagnosis Date Noted  . Acute pain of right knee 01/23/2017  . Right hip pain 01/23/2017  . Acute respiratory failure with hypoxia ([place]) 10/29/2016  . Diarrhea 10/29/2016  . CAP  (community acquired pneumonia) 10/29/2016  . Odynophagia 09/23/2016  . Community acquired pneumonia 09/07/2014  . Sepsis ([place]) 09/07/2014  . Acute encephalopathy 09/07/2014  . Thrombocytopenia ([place]) 09/07/2014  . Carotid stenosis 07/14/2014  . External hemorrhoid 02/28/2013  . Weakness generalized 02/28/2013  . Hyponatremia 02/18/2013  . Abdominal aortic aneurysm ([place]) 10/12/2012  . DYSPHAGIA 11/15/2009  . CONSTIPATION, CHRONIC 08/10/2009  . HEMATOCHEZIA 08/10/2009  . HYPERCHOLESTEROLEMIA 08/09/2009  . MACULAR DEGENERATION 08/09/2009  . Essential hypertension 08/09/2009  . CAD S/P RCA stent '06 08/09/2009  . TIA 08/09/2009  . GERD 08/09/2009  . ARTHRITIS 08/09/2009  . BENIGN PROSTATIC HYPERTROPHY, HX OF 08/09/2009   Past Medical History:  Diagnosis Date  . AAA (abdominal aortic aneurysm) ([place])   . Anemia   . Anemia   . Arthritis   . Arthritis   . CAD (coronary artery disease)   . Cancer ([place]) 2016   skin caner: Forehead  . GERD (gastroesophageal reflux disease)   . Hiatal hernia   . Hyperlipidemia   . Hypertension   . Macular degeneration   . Peripheral vascular disease ([place])   . Trouble swallowing     Family History  Problem Relation Age of Onset  . Heart failure Mother   . Diabetes Mother   . Heart disease Mother   . Pneumonia Father   . Cancer Brother   . Diabetes Brother   . Heart disease Brother     Past Surgical History:  Procedure Laterality Date  . ANGIOPLASTY / STENTING ILIAC  03-2011   Right Iliac - Gore Stent graft   . BACK SURGERY  09-05-2011    . COLONOSCOPY  08/2009   Rourk: report unavailable, two tubular adenomas and acute ileitis on path  . CORONARY ANGIOPLASTY WITH STENT PLACEMENT    . ESOPHAGOGASTRODUODENOSCOPY  2008   Rourk: Critical Schazki ring s/p dilation, hiatal hernia  . ESOPHAGOGASTRODUODENOSCOPY  09/2005   Rourk: S-shaped distal esophageal erosion, middle to distal esopahgus s/p multiple biopsies (unavailable)  .  ESOPHAGOGASTRODUODENOSCOPY  06/2005   Rourk: solitary mid esophageal lesion s/p bx (low grade dysplasia), prominent schatzki ring, large hh  . ESOPHAGOGASTRODUODENOSCOPY  11/2009   Rourk: Schatzki ring s/p dilation  . ESOPHAGOGASTRODUODENOSCOPY (EGD) WITH PROPOFOL N/A 11/20/2016   Procedure: ESOPHAGOGASTRODUODENOSCOPY (EGD) WITH PROPOFOL;  Surgeon: Daneil Dolin, MD;  Location: [facility] END[facility];  Service: Endoscopy;  Laterality: N/A;  830  . EUS     Mishra:  . MALONEY DILATION  11/20/2016   Procedure: Venia Minks DILATION;  Surgeon: Daneil Dolin, MD;  Location: [facility] SUITE;  Service: Endoscopy;;  . SPINE SURGERY    . TONSILLECTOMY     Social History   Occupational History  . Not on file.   Social History Main Topics  . Smoking status: Former Smoker  Types: Cigarettes    Quit date: 12/08/1990  . Smokeless tobacco: Former Systems developer    Types: Chew    Quit date: 09/13/2014  . Alcohol use No  . Drug use: No  . Sexual activity: Not on file

## 2017-02-18 ENCOUNTER — Ambulatory Visit: Payer: Medicare Other | Admitting: Gastroenterology

## 2017-02-25 ENCOUNTER — Ambulatory Visit (INDEPENDENT_AMBULATORY_CARE_PROVIDER_SITE_OTHER): Payer: Medicare Other | Admitting: Orthopedic Surgery

## 2017-02-25 ENCOUNTER — Encounter (INDEPENDENT_AMBULATORY_CARE_PROVIDER_SITE_OTHER): Payer: Self-pay | Admitting: Orthopedic Surgery

## 2017-02-25 DIAGNOSIS — M25561 Pain in right knee: Secondary | ICD-10-CM

## 2017-02-25 DIAGNOSIS — G8929 Other chronic pain: Secondary | ICD-10-CM | POA: Diagnosis not present

## 2017-02-25 NOTE — Progress Notes (Signed)
Office Visit Note   Patient: [name]           Date of Birth: 05/16/1918           MRN: [id number] Visit Date: 02/25/2017 Requested by: Jeremy Ebbs, MD [address] [place], [place] [address] PCP: Jeremy Fendt, MD  Subjective: Chief Complaint  Patient presents with  . Right Knee - Pain    HPI: [name] is an 83 year old patient with right knee pain.  States he twisted the knee 3 weeks ago.  He had good result with prior aspiration and injection and he states the knee is now painful again.  He relates with a walker.  He is here with his caregiver.              ROS: All systems reviewed are negative as they relate to the chief complaint within the history of present illness.  Patient denies  fevers or chills.   Assessment & Plan: Visit Diagnoses:  1. Chronic pain of right knee     Plan: Impression right knee pain exacerbation of arthritis in an active 83 year old patient.  It is a little too soon for another cortisone shot but we can aspirate the knee today and inject some Marcaine.  That's done through superior lateral approach.  I'll see him back as needed  Follow-Up Instructions: No Follow-up on file.   Orders:  No orders of the defined types were placed in this encounter.  No orders of the defined types were placed in this encounter.     Procedures: Large Joint Inj Date/Time: 02/25/2017 5:12 PM Performed by: [name] Authorized by: [name]   Consent Given by:  Patient Site marked: the procedure site was marked   Timeout: prior to procedure the correct patient, procedure, and site was verified   Indications:  Pain, joint swelling and diagnostic evaluation Location:  Knee Site:  R knee Prep: patient was prepped and draped in usual sterile fashion   Needle Size:  18 G Needle Length:  1.5 inches Approach:  Superolateral Ultrasound Guidance: No   Fluoroscopic Guidance: No   Arthrogram: No   Medications:  5 mL lidocaine 1 %; 4 mL  bupivacaine 0.25 % Aspiration Attempted: Yes   Aspirate amount (mL):  25 Patient tolerance:  Patient tolerated the procedure well with no immediate complications     Clinical Data: No additional findings.  Objective: Vital Signs: There were no vitals taken for this visit.  Physical Exam:   Constitutional: Patient appears well-developed HEENT:  Head: Normocephalic Eyes:EOM are normal Neck: Normal range of motion Cardiovascular: Normal rate Pulmonary/chest: Effort normal Neurologic: Patient is alert Skin: Skin is warm Psychiatric: Patient has normal mood and affect    Ortho Exam: Orthopedic exam demonstrates the patient's chart of hearing.  Ambulates with a walker.  Has slight varus alignment to the right lower extremity.  Medial joint line tenderness is present.  Pedal pulses palpable.  He does have a mild effusion in the right knee no effusion in the left knee.  No groin pain with internal/external rotation of the right-hand side.  Specialty Comments:  No specialty comments available.  Imaging: No results found.   PMFS History: Patient Active Problem List   Diagnosis Date Noted  . Acute pain of right knee 01/23/2017  . Right hip pain 01/23/2017  . Acute respiratory failure with hypoxia ([place]) 10/29/2016  . Diarrhea 10/29/2016  . CAP (community acquired pneumonia) 10/29/2016  . Odynophagia 09/23/2016  .  Community acquired pneumonia 09/07/2014  . Sepsis ([place]) 09/07/2014  . Acute encephalopathy 09/07/2014  . Thrombocytopenia ([place]) 09/07/2014  . Carotid stenosis 07/14/2014  . External hemorrhoid 02/28/2013  . Weakness generalized 02/28/2013  . Hyponatremia 02/18/2013  . Abdominal aortic aneurysm ([place]) 10/12/2012  . DYSPHAGIA 11/15/2009  . CONSTIPATION, CHRONIC 08/10/2009  . HEMATOCHEZIA 08/10/2009  . HYPERCHOLESTEROLEMIA 08/09/2009  . MACULAR DEGENERATION 08/09/2009  . Essential hypertension 08/09/2009  . CAD S/P RCA stent '06 08/09/2009  . TIA 08/09/2009  .  GERD 08/09/2009  . ARTHRITIS 08/09/2009  . BENIGN PROSTATIC HYPERTROPHY, HX OF 08/09/2009   Past Medical History:  Diagnosis Date  . AAA (abdominal aortic aneurysm) ([place])   . Anemia   . Anemia   . Arthritis   . Arthritis   . CAD (coronary artery disease)   . Cancer ([facility]) 2016   skin caner: Forehead  . GERD (gastroesophageal reflux disease)   . Hiatal hernia   . Hyperlipidemia   . Hypertension   . Macular degeneration   . Peripheral vascular disease ([place])   . Trouble swallowing     Family History  Problem Relation Age of Onset  . Heart failure Mother   . Diabetes Mother   . Heart disease Mother   . Pneumonia Father   . Cancer Brother   . Diabetes Brother   . Heart disease Brother     Past Surgical History:  Procedure Laterality Date  . ANGIOPLASTY / STENTING ILIAC  03-2011   Right Iliac - Gore Stent graft   . BACK SURGERY  09-05-2011    . COLONOSCOPY  08/2009   [name]: report unavailable, two tubular adenomas and acute ileitis on path  . CORONARY ANGIOPLASTY WITH STENT PLACEMENT    . ESOPHAGOGASTRODUODENOSCOPY  2008   [name]: Critical Schazki ring s/p dilation, hiatal hernia  . ESOPHAGOGASTRODUODENOSCOPY  09/2005   [name]: S-shaped distal esophageal erosion, middle to distal esopahgus s/p multiple biopsies (unavailable)  . ESOPHAGOGASTRODUODENOSCOPY  06/2005   [name]: solitary mid esophageal lesion s/p bx (low grade dysplasia), prominent schatzki ring, large hh  . ESOPHAGOGASTRODUODENOSCOPY  11/2009   [name]: Schatzki ring s/p dilation  . ESOPHAGOGASTRODUODENOSCOPY (EGD) WITH PROPOFOL N/A 11/20/2016   Procedure: ESOPHAGOGASTRODUODENOSCOPY (EGD) WITH PROPOFOL;  Surgeon: Jeremy Dolin, MD;  [facility];  Service: Endoscopy;  Laterality: N/A;  830  . EUS     [name]:  . MALONEY DILATION  11/20/2016   Procedure: [name] DILATION;  Surgeon: Jeremy Dolin, MD;  [facility];  Service: Endoscopy;;  . SPINE SURGERY    . TONSILLECTOMY     Social  History   Occupational History  . Not on file.   Social History Main Topics  . Smoking status: Former Smoker    Types: Cigarettes    Quit date: 12/08/1990  . Smokeless tobacco: Former Systems developer    Types: Chew    Quit date: 09/13/2014  . Alcohol use No  . Drug use: No  . Sexual activity: Not on file

## 2017-02-26 MED ORDER — BUPIVACAINE HCL 0.25 % IJ SOLN
4.0000 mL | INTRAMUSCULAR | Status: AC | PRN
  Administered 2017-02-25: 4 mL via INTRA_ARTICULAR

## 2017-02-26 MED ORDER — LIDOCAINE HCL 1 % IJ SOLN
5.0000 mL | INTRAMUSCULAR | Status: AC | PRN
  Administered 2017-02-25: 5 mL

## 2017-06-01 ENCOUNTER — Encounter (INDEPENDENT_AMBULATORY_CARE_PROVIDER_SITE_OTHER): Payer: Self-pay | Admitting: Orthopedic Surgery

## 2017-06-01 ENCOUNTER — Ambulatory Visit (INDEPENDENT_AMBULATORY_CARE_PROVIDER_SITE_OTHER): Payer: Medicare Other | Admitting: Orthopedic Surgery

## 2017-06-01 DIAGNOSIS — M25561 Pain in right knee: Secondary | ICD-10-CM | POA: Diagnosis not present

## 2017-06-01 DIAGNOSIS — I2510 Atherosclerotic heart disease of native coronary artery without angina pectoris: Secondary | ICD-10-CM

## 2017-06-01 DIAGNOSIS — Z9861 Coronary angioplasty status: Secondary | ICD-10-CM | POA: Diagnosis not present

## 2017-06-03 NOTE — Progress Notes (Signed)
Office Visit Note   Patient: [name]           Date of Birth: 06-27-1918           MRN: [id number] Visit Date: 06/01/2017 Requested by: Nolene Ebbs, MD [address] [place], [place] [address] PCP: Nolene Ebbs, MD  Subjective: Chief Complaint  Patient presents with  . Right Knee - Follow-up    HPI: Chanc is a patient who is here to follow-up injection of his right knee.  Had an injection 02/25/2017.  He's doing well and is not having any problems.  His caregivers here with him today.  She states that he ambulates with a walker on a daily basis.  He  does not report any mechanical symptoms in the knee.              ROS: All systems reviewed are negative as they relate to the chief complaint within the history of present illness.  Patient denies  fevers or chills. *  Assessment & Plan: Visit Diagnoses:  1. Acute pain of right knee     Plan: Impression is improvement in right knee pain following injection.  Plan is for follow-up as needed.  Don't want to do another injection yet until he really needs it.  He started had 2 injections this year.  He only has trace effusion.  We'll see him back later this year if needed for another injection should his symptoms recur.  Follow-Up Instructions: Return if symptoms worsen or fail to improve.   Orders:  No orders of the defined types were placed in this encounter.  No orders of the defined types were placed in this encounter.     Procedures: No procedures performed   Clinical Data: No additional findings.  Objective: Vital Signs: There were no vitals taken for this visit.  Physical Exam:   Constitutional: Patient appears well-developed HEENT:  Head: Normocephalic Eyes:EOM are normal Neck: Normal range of motion Cardiovascular: Normal rate Pulmonary/chest: Effort normal Neurologic: Patient is alert Skin: Skin is warm Psychiatric: Patient has normal mood and affect    Ortho Exam: Orthopedic exam  demonstrates mild patella femoral crepitus with range of motion of the right knee.  Extensor mechanism is intact.  Trace effusion is present.  Pedal pulses palpable.  No groin pain interlocks rotation leg.  Meal and lateral joint line tenderness is noted.  Specialty Comments:  No specialty comments available.  Imaging: No results found.   PMFS History: Patient Active Problem List   Diagnosis Date Noted  . Acute pain of right knee 01/23/2017  . Right hip pain 01/23/2017  . Acute respiratory failure with hypoxia ([place]) 10/29/2016  . Diarrhea 10/29/2016  . CAP (community acquired pneumonia) 10/29/2016  . Odynophagia 09/23/2016  . Community acquired pneumonia 09/07/2014  . Sepsis ([place]) 09/07/2014  . Acute encephalopathy 09/07/2014  . Thrombocytopenia ([place]) 09/07/2014  . Carotid stenosis 07/14/2014  . External hemorrhoid 02/28/2013  . Weakness generalized 02/28/2013  . Hyponatremia 02/18/2013  . Abdominal aortic aneurysm ([place]) 10/12/2012  . DYSPHAGIA 11/15/2009  . CONSTIPATION, CHRONIC 08/10/2009  . HEMATOCHEZIA 08/10/2009  . HYPERCHOLESTEROLEMIA 08/09/2009  . MACULAR DEGENERATION 08/09/2009  . Essential hypertension 08/09/2009  . CAD S/P RCA stent '06 08/09/2009  . TIA 08/09/2009  . GERD 08/09/2009  . ARTHRITIS 08/09/2009  . BENIGN PROSTATIC HYPERTROPHY, HX OF 08/09/2009   Past Medical History:  Diagnosis Date  . AAA (abdominal aortic aneurysm) ([place])   . Anemia   . Anemia   .  Arthritis   . Arthritis   . CAD (coronary artery disease)   . Cancer ([facility]) 2016   skin caner: Forehead  . GERD (gastroesophageal reflux disease)   . Hiatal hernia   . Hyperlipidemia   . Hypertension   . Macular degeneration   . Peripheral vascular disease ([place])   . Trouble swallowing     Family History  Problem Relation Age of Onset  . Heart failure Mother   . Diabetes Mother   . Heart disease Mother   . Pneumonia Father   . Cancer Brother   . Diabetes Brother   . Heart disease Brother      Past Surgical History:  Procedure Laterality Date  . ANGIOPLASTY / STENTING ILIAC  03-2011   Right Iliac - Gore Stent graft   . BACK SURGERY  09-05-2011    . COLONOSCOPY  08/2009   Rourk: report unavailable, two tubular adenomas and acute ileitis on path  . CORONARY ANGIOPLASTY WITH STENT PLACEMENT    . ESOPHAGOGASTRODUODENOSCOPY  2008   Rourk: Critical Schazki ring s/p dilation, hiatal hernia  . ESOPHAGOGASTRODUODENOSCOPY  09/2005   Rourk: S-shaped distal esophageal erosion, middle to distal esopahgus s/p multiple biopsies (unavailable)  . ESOPHAGOGASTRODUODENOSCOPY  06/2005   Rourk: solitary mid esophageal lesion s/p bx (low grade dysplasia), prominent schatzki ring, large hh  . ESOPHAGOGASTRODUODENOSCOPY  11/2009   Rourk: Schatzki ring s/p dilation  . ESOPHAGOGASTRODUODENOSCOPY (EGD) WITH PROPOFOL N/A 11/20/2016   Procedure: ESOPHAGOGASTRODUODENOSCOPY (EGD) WITH PROPOFOL;  Surgeon: Daneil Dolin, MD;  [facility];  Service: Endoscopy;  Laterality: N/A;  830  . EUS     Mishra:  . MALONEY DILATION  11/20/2016   Procedure: Venia Minks DILATION;  Surgeon: Daneil Dolin, MD;  [facility];  Service: Endoscopy;;  . SPINE SURGERY    . TONSILLECTOMY     Social History   Occupational History  . Not on file.   Social History Main Topics  . Smoking status: Former Smoker    Types: Cigarettes    Quit date: 12/08/1990  . Smokeless tobacco: Former Systems developer    Types: Chew    Quit date: 09/13/2014  . Alcohol use No  . Drug use: No  . Sexual activity: Not on file

## 2017-07-24 ENCOUNTER — Encounter: Payer: Self-pay | Admitting: Family

## 2017-08-04 ENCOUNTER — Encounter: Payer: Self-pay | Admitting: Family

## 2017-08-04 ENCOUNTER — Ambulatory Visit (INDEPENDENT_AMBULATORY_CARE_PROVIDER_SITE_OTHER): Payer: Medicare Other | Admitting: Family

## 2017-08-04 VITALS — BP 169/79 | HR 58 | Temp 98.6°F | Resp 18 | Ht 66.0 in | Wt 148.2 lb

## 2017-08-04 DIAGNOSIS — I723 Aneurysm of iliac artery: Secondary | ICD-10-CM | POA: Diagnosis not present

## 2017-08-04 DIAGNOSIS — I2510 Atherosclerotic heart disease of native coronary artery without angina pectoris: Secondary | ICD-10-CM

## 2017-08-04 DIAGNOSIS — Z9861 Coronary angioplasty status: Secondary | ICD-10-CM | POA: Diagnosis not present

## 2017-08-04 DIAGNOSIS — Z95828 Presence of other vascular implants and grafts: Secondary | ICD-10-CM

## 2017-08-04 NOTE — Progress Notes (Signed)
[facility]  CC: Follow up s/p Endovascular Repair of Right Iliac Artery Aneurysm    History of Present Illness  [name] is a 83 y.o. (March 20, 1918) male patient of Dr. Donnetta Hutching who is s/p Angioplasty / stenting iliac/Right Iliac - Gore Stent graft 03-2011 for right common iliac artery aneurysm.  He returns today for an evaluation by a medical provider before his follow up CT will be approved for coverage by his insurance.   He returned on 09-14-15 with c/o pain in his right groin that radiated distally to his inner thigh; started about 3-4 days prior to visit. He denied any known straining or injury, but his caretaker with him stated that he had been walking a great deal with his rolling walker.  On 07/31/15 CTA abdomen/pelvis showed a stable repair of right common iliac artery aneurysm, no endo leak, with shrinking sac size to 3.1 cm (previously 3.7cm). There was stable occlusion of the right internal iliac artery trunk by coils.  Dr. Gwenlyn Found has been monitoring his carotid arteries. The patient has not had abdominal pain. He does have chronic back pain, no new back pain. Pt denies any history of stroke or TIA.   The pt is extremely hard of hearing; as best can be ascertained, he does not seem to have claudication in his legs with walking. He has no non healing wounds.  He has skin cancer removed from his nose and right forearm recently.   Pt Diabetic: No Pt smoker: former smoker, quit in 1992, but chewed a great deal of tobacco all his life until he stopped this about February 2016.   Pt meds include: Statin :Yes Betablocker: Yes ASA: Yes Other anticoagulants/antiplatelets: Plavix   Past Medical History:  Diagnosis Date  . AAA (abdominal aortic aneurysm) ([facility])   . Anemia   . Anemia   . Arthritis   . Arthritis   . CAD (coronary artery disease)   . Cancer ([facility]) 2016   skin caner: Forehead  . GERD (gastroesophageal reflux disease)   . Hiatal  hernia   . Hyperlipidemia   . Hypertension   . Macular degeneration   . Peripheral vascular disease ([facility])   . Trouble swallowing    Past Surgical History:  Procedure Laterality Date  . ANGIOPLASTY / STENTING ILIAC  03-2011   Right Iliac - Gore Stent graft   . BACK SURGERY  09-05-2011    . COLONOSCOPY  08/2009   Rourk: report unavailable, two tubular adenomas and acute ileitis on path  . CORONARY ANGIOPLASTY WITH STENT PLACEMENT    . ESOPHAGOGASTRODUODENOSCOPY  2008   Rourk: Critical Schazki ring s/p dilation, hiatal hernia  . ESOPHAGOGASTRODUODENOSCOPY  09/2005   Rourk: S-shaped distal esophageal erosion, middle to distal esopahgus s/p multiple biopsies (unavailable)  . ESOPHAGOGASTRODUODENOSCOPY  06/2005   Rourk: solitary mid esophageal lesion s/p bx (low grade dysplasia), prominent schatzki ring, large hh  . ESOPHAGOGASTRODUODENOSCOPY  11/2009   Rourk: Schatzki ring s/p dilation  . ESOPHAGOGASTRODUODENOSCOPY (EGD) WITH PROPOFOL N/A 11/20/2016   Procedure: ESOPHAGOGASTRODUODENOSCOPY (EGD) WITH PROPOFOL;  Surgeon: Daneil Dolin, MD;  [facility];  Service: Endoscopy;  Laterality: N/A;  830  . EUS     Mishra:  . MALONEY DILATION  11/20/2016   Procedure: Venia Minks DILATION;  Surgeon: Daneil Dolin, MD;  [facility];  Service: Endoscopy;;  . SPINE SURGERY    . TONSILLECTOMY     Social History Social History  Substance Use  Topics  . Smoking status: Former Smoker    Types: Cigarettes    Quit date: 12/08/1990  . Smokeless tobacco: Former Systems developer    Types: Chew    Quit date: 09/13/2014  . Alcohol use No   Family History Family History  Problem Relation Age of Onset  . Heart failure Mother   . Diabetes Mother   . Heart disease Mother   . Pneumonia Father   . Cancer Brother   . Diabetes Brother   . Heart disease Brother    Current Outpatient Prescriptions on File Prior to Visit  Medication Sig Dispense Refill  . acetaminophen (TYLENOL) 500 MG tablet  Take 500 mg by mouth every 6 (six) hours as needed for mild pain.    Marland Kitchen alfuzosin (UROXATRAL) 10 MG 24 hr tablet Take 10 mg by mouth every evening.     Marland Kitchen ALPRAZolam (XANAX) 0.25 MG tablet Take 0.25 mg by mouth 2 (two) times daily.     Marland Kitchen aspirin EC 81 MG tablet Take 81 mg by mouth daily.    Marland Kitchen atorvastatin (LIPITOR) 20 MG tablet Take 20 mg by mouth daily at 6 PM.     . cetirizine (ZYRTEC) 10 MG tablet Take 10 mg by mouth daily.    . clopidogrel (PLAVIX) 75 MG tablet Take 75 mg by mouth daily. Will stop prior to procedure    . diclofenac sodium (VOLTAREN) 1 % GEL Apply 4 g topically 2 (two) times daily as needed (pain).     . ferrous sulfate 325 (65 FE) MG tablet Take 325 mg by mouth daily with breakfast.    . lansoprazole (PREVACID) 15 MG capsule Take 15 mg by mouth daily. 2 hours before meal    . meclizine (ANTIVERT) 25 MG tablet Take 25 mg by mouth every 6 (six) hours as needed for dizziness.    . metoprolol succinate (TOPROL-XL) 25 MG 24 hr tablet Take 12.5 mg by mouth 2 (two) times daily.     . Multiple Vitamin (MULTIVITAMIN PO) Take 1 tablet by mouth daily.     . Multiple Vitamins-Minerals (ICAPS MV PO) Take 1 capsule by mouth at bedtime.     . polyethylene glycol powder (GLYCOLAX/MIRALAX) powder Take 17 g by mouth every evening.     . pregabalin (LYRICA) 50 MG capsule Take 50 mg by mouth at bedtime.    . Psyllium (METAMUCIL PO) Take 1 scoop by mouth daily.    . tamsulosin (FLOMAX) 0.4 MG CAPS capsule Take 0.4 mg by mouth daily.     . traMADol (ULTRAM) 50 MG tablet Take 50 mg by mouth 2 (two) times daily as needed for moderate pain.      No current facility-administered medications on file prior to visit.    Allergies  Allergen Reactions  . Hydrocodone Anaphylaxis and Nausea And Vomiting  . Oxycodone Nausea And Vomiting     ROS: See HPI for pertinent positives and negatives.  Physical Examination  Vitals:   08/04/17 1242 08/04/17 1243  BP: (!) 191/79 (!) 169/79  Pulse: (!) 58    Resp: 18   Temp: 98.6 F (37 C)   TempSrc: Oral   SpO2: 94%   Weight: 148 lb 3.2 oz (67.2 kg)   Height: 5\' 6"  (1.676 m)    Body mass index is 23.92 kg/m.  General: A&O x 3, WD, walking with rolling walker, steady  Pulmonary: Sym exp, good air movt, CTAB, no rales, rhonchi, or wheezing  Cardiac: Mostly regular rhythm with occasional  to frequent premature contractions,  no murmur appreciated  Vascular: Vessel Right Left  Radial 2+Palpable 2+Palpable  Carotid without bruit without bruit  Aorta not palpable N/A  Femoral 3+Palpable 3+Palpable  Popliteal 1+ palpable 1+ palpable  PT Not Palpable Not Palpable  DP 1+Palpable 1+Palpable   Gastrointestinal: soft, NTND, -G/R, - HSM, - palpable masses, - CVAT B. Mild tenderness to palpation at right groin, no mass palpated. No right sided scrotal enlargement noted.  Musculoskeletal: M/S 4/5 throughout, Extremities without ischemic changes. Moderate kyphosis.   Neurologic: Pain and light touch intact in extremities, Motor exam as listed above. He is very hard of hearing with hearing aids in place.    DATA  CTA Abd/Pelvis Duplex (Date: 07/31/15):  FINDINGS: Since prior imaging, the bifurcated aortic endograft shows stable positioning and normal patency. Thrombosed aneurysm sac surrounding the endograft is stable in size with maximum dimensions of approximately 3.2 x 3.6 cm. There is no evidence of endoleak by CTA.  Thrombosed right common iliac artery aneurysm again noted surrounding endograft limb extension. This aneurysm shows decrease in size from 3.7 cm on the prior CTA to 3.1 cm. There is stable occlusion of the right internal iliac artery by coils. Native external iliac artery and common femoral artery show stable patency with stable calcified plaque in the common femoral artery and at the right SFA origin causing moderate stenosis. The left iliac limb shows stable  patency and positioning, terminating in the distal left common iliac artery. Native external and common femoral arteries show stable patency on the left.  Visceral arteries show stable atherosclerosis with approximately 50- 60% stenosis at the origin of the celiac axis, 50- 70% stenosis in the proximal superior mesenteric artery and mild stenoses of bilateral renal arteries.  IMPRESSION: 1. Stable patency and positioning of aortic endograft. The aneurysm sac size is stable measuring 3.6 cm in greatest diameter. There is no evidence of endoleak by CTA. 2. Stable exclusion of right common iliac artery aneurysm with decrease in thrombosed aneurysm sac size to 3.1 cm (previously 3.7 cm). There is stable occlusion of the right internal iliac artery trunk by coils. 3. Stable moderate disease involving the proximal celiac axis, proximal SMA, right common femoral artery and right SFA origin.   Medical Decision Making  [name] is a 83 y.o. male who is s/p Angioplasty / stenting iliac/Right Iliac - Gore Stent graft 03-2011 for abdominal aorta and iliac artery aneurysms.  He c/o 3-4 days history of right groin pain that radiate down his inner right thigh. No pseudoaneurysm was seen on ultrasound (09-14-15 ) of his right groin, no mass was palpated in his right groin.  He is to get a CTA abdomen/pelvis every 2 years to evaluate his EVAR as noted by Dr. Donnetta Hutching in his 10/12/12 progress note.   Will request CTA abd/pelvis in the next 1-2 months to evaluate eight CIA stent, see me afterward. .     I emphasized the importance of maximal medical management including strict control of blood pressure, blood glucose, and lipid levels, antiplatelet agents, obtaining regular exercise, and cessation of smoking.   Thank you for allowing [place] to participate in this patient's care.  Clemon Chambers, RN, MSN, FNP-C [facility] Office: [phone]  Clinic Physician:  Scot Dock on call  08/04/2017, 12:44 PM

## 2017-09-01 ENCOUNTER — Ambulatory Visit
Admission: RE | Admit: 2017-09-01 | Discharge: 2017-09-01 | Disposition: A | Discharge status: Home / Self Care | Payer: Medicare Other | Source: Ambulatory Visit | Attending: Family | Admitting: Family

## 2017-09-01 DIAGNOSIS — Z95828 Presence of other vascular implants and grafts: Secondary | ICD-10-CM

## 2017-09-01 DIAGNOSIS — I723 Aneurysm of iliac artery: Secondary | ICD-10-CM

## 2017-09-01 MED ORDER — IOPAMIDOL (ISOVUE-370) INJECTION 76%
75.0000 mL | Freq: Once | INTRAVENOUS | Status: AC | PRN
  Administered 2017-09-01: 75 mL via INTRAVENOUS

## 2017-09-08 ENCOUNTER — Encounter: Payer: Self-pay | Admitting: Family

## 2017-09-08 ENCOUNTER — Ambulatory Visit (INDEPENDENT_AMBULATORY_CARE_PROVIDER_SITE_OTHER): Payer: Medicare Other | Admitting: Family

## 2017-09-08 VITALS — BP 179/76 | HR 67 | Temp 98.7°F | Resp 20 | Ht 66.0 in | Wt 151.0 lb

## 2017-09-08 DIAGNOSIS — I2510 Atherosclerotic heart disease of native coronary artery without angina pectoris: Secondary | ICD-10-CM

## 2017-09-08 DIAGNOSIS — Z9861 Coronary angioplasty status: Secondary | ICD-10-CM | POA: Diagnosis not present

## 2017-09-08 DIAGNOSIS — I723 Aneurysm of iliac artery: Secondary | ICD-10-CM

## 2017-09-08 DIAGNOSIS — Z95828 Presence of other vascular implants and grafts: Secondary | ICD-10-CM | POA: Diagnosis not present

## 2017-09-08 NOTE — Progress Notes (Signed)
[facility] [place]  CC: Follow up s/p Endovascular Repair of Right Iliac Artery Aneurysm     History of Present Illness  [name] is a 83 y.o. (03-08-1918) male who is s/p Angioplasty / stenting iliac/Right Iliac - Gore Stent graft on 03-2011 by Dr. Donnetta Hutching for right common iliac artery aneurysm. He is a [employer] recipient and WWII veteran.   He returns today for discussion of the results of his CTA abd/pelvis.   He returned on 09-14-15 with c/o pain in his right groin that radiated distally to his inner thigh; started about 3-4 days prior to visit. He denied any known straining or injury, but his caretaker with him stated that he had been walking a great deal with his rolling walker.  On 07/31/15 CTA abdomen/pelvis showed a stable repair of right common iliac artery aneurysm, no endo leak, with shrinking sac size to 3.1 cm (previously 3.7cm). There was stable occlusion of the right internal iliac artery trunk by coils.  Dr. Gwenlyn Found has been monitoring his carotid arteries. The patient has not had abdominal pain. He does have chronic back pain, no new back pain. Pt denies any history of stroke or TIA.   The pt is extremely hard of hearing; as best can be ascertained, he does not seem to have claudication in his legs with walking, he walks with a walker, he is with his caregiver. He has no non healing wounds.  He has skin cancer removed from his nose and right forearm recently.   Pt Diabetic: No Pt smoker: former smoker, quit in 1992, but chewed a great deal of tobacco all his life until he stopped this about February 2016.   Pt meds include: Statin :Yes Betablocker: Yes ASA: Yes Other anticoagulants/antiplatelets: Plavix   Past Medical History:  Diagnosis Date  . AAA (abdominal aortic aneurysm) ([place])   . Anemia   . Anemia   . Arthritis   . Arthritis   . CAD (coronary artery disease)   . Cancer ([place]) 2016   skin caner: Forehead  . GERD  (gastroesophageal reflux disease)   . Hiatal hernia   . Hyperlipidemia   . Hypertension   . Macular degeneration   . Peripheral vascular disease ([place])   . Trouble swallowing    Past Surgical History:  Procedure Laterality Date  . ANGIOPLASTY / STENTING ILIAC  03-2011   Right Iliac - Gore Stent graft   . BACK SURGERY  09-05-2011    . COLONOSCOPY  08/2009   Rourk: report unavailable, two tubular adenomas and acute ileitis on path  . CORONARY ANGIOPLASTY WITH STENT PLACEMENT    . ESOPHAGOGASTRODUODENOSCOPY  2008   Rourk: Critical Schazki ring s/p dilation, hiatal hernia  . ESOPHAGOGASTRODUODENOSCOPY  09/2005   Rourk: S-shaped distal esophageal erosion, middle to distal esopahgus s/p multiple biopsies (unavailable)  . ESOPHAGOGASTRODUODENOSCOPY  06/2005   Rourk: solitary mid esophageal lesion s/p bx (low grade dysplasia), prominent schatzki ring, large hh  . ESOPHAGOGASTRODUODENOSCOPY  11/2009   Rourk: Schatzki ring s/p dilation  . ESOPHAGOGASTRODUODENOSCOPY (EGD) WITH PROPOFOL N/A 11/20/2016   Procedure: ESOPHAGOGASTRODUODENOSCOPY (EGD) WITH PROPOFOL;  Surgeon: Daneil Dolin, MD;  [facility]: [facility];  Service: Endoscopy;  Laterality: N/A;  830  . EUS     Mishra:  . MALONEY DILATION  11/20/2016   Procedure: Venia Minks DILATION;  Surgeon: Daneil Dolin, MD;  Location: [facility];  Service: Endoscopy;;  . SPINE SURGERY    . TONSILLECTOMY  Social History Social History  Substance Use Topics  . Smoking status: Former Smoker    Types: Cigarettes    Quit date: 12/08/1990  . Smokeless tobacco: Former Systems developer    Types: Chew    Quit date: 09/13/2014  . Alcohol use No   Family History Family History  Problem Relation Age of Onset  . Heart failure Mother   . Diabetes Mother   . Heart disease Mother   . Pneumonia Father   . Cancer Brother   . Diabetes Brother   . Heart disease Brother    Current Outpatient Prescriptions on File Prior to Visit  Medication Sig Dispense Refill   . acetaminophen (TYLENOL) 500 MG tablet Take 500 mg by mouth every 6 (six) hours as needed for mild pain.    Marland Kitchen alfuzosin (UROXATRAL) 10 MG 24 hr tablet Take 10 mg by mouth every evening.     Marland Kitchen ALPRAZolam (XANAX) 0.25 MG tablet Take 0.25 mg by mouth 2 (two) times daily.     Marland Kitchen aspirin EC 81 MG tablet Take 81 mg by mouth daily.    Marland Kitchen atorvastatin (LIPITOR) 20 MG tablet Take 20 mg by mouth daily at 6 PM.     . cetirizine (ZYRTEC) 10 MG tablet Take 10 mg by mouth daily.    . clopidogrel (PLAVIX) 75 MG tablet Take 75 mg by mouth daily. Will stop prior to procedure    . diclofenac sodium (VOLTAREN) 1 % GEL Apply 4 g topically 2 (two) times daily as needed (pain).     . ferrous sulfate 325 (65 FE) MG tablet Take 325 mg by mouth daily with breakfast.    . lansoprazole (PREVACID) 15 MG capsule Take 15 mg by mouth daily. 2 hours before meal    . meclizine (ANTIVERT) 25 MG tablet Take 25 mg by mouth every 6 (six) hours as needed for dizziness.    . metoprolol succinate (TOPROL-XL) 25 MG 24 hr tablet Take 12.5 mg by mouth 2 (two) times daily.     . Multiple Vitamin (MULTIVITAMIN PO) Take 1 tablet by mouth daily.     . Multiple Vitamins-Minerals (ICAPS MV PO) Take 1 capsule by mouth at bedtime.     . polyethylene glycol powder (GLYCOLAX/MIRALAX) powder Take 17 g by mouth every evening.     . pregabalin (LYRICA) 50 MG capsule Take 50 mg by mouth at bedtime.    . Psyllium (METAMUCIL PO) Take 1 scoop by mouth daily.    . tamsulosin (FLOMAX) 0.4 MG CAPS capsule Take 0.4 mg by mouth daily.     . traMADol (ULTRAM) 50 MG tablet Take 50 mg by mouth 2 (two) times daily as needed for moderate pain.      No current facility-administered medications on file prior to visit.    Allergies  Allergen Reactions  . Hydrocodone Anaphylaxis and Nausea And Vomiting  . Oxycodone Nausea And Vomiting     ROS: See HPI for pertinent positives and negatives.  Physical Examination  Vitals:   09/08/17 1323  BP: (!) 179/76   Pulse: 67  Resp: 20  Temp: 98.7 F (37.1 C)  TempSrc: Oral  SpO2: 96%  Weight: 151 lb (68.5 kg)  Height: 5\' 6"  (1.676 m)   Body mass index is 24.37 kg/m.  General: A&O x 3, WD, walking with rolling walker, steady, appears younger than stated age.  Pulmonary: Sym exp, respirations are non labored, good air movt, CTAB, no rales, rhonchi, or wheezing  Cardiac: Mostly regular rhythm  with occasional premature contractions,  no murmur appreciated  Vascular: Vessel Right Left  Radial 2+Palpable 2+Palpable  Carotid without bruit without bruit  Aorta not palpable N/A  Femoral 3+Palpable 3+Palpable  Popliteal 1+ palpable 1+ palpable  PT 2+ Palpable 2+ Palpable  DP 1+Palpable 1+Palpable   Gastrointestinal: soft, NTND, -G/R, - HSM, - palpable masses, - CVAT B. Mild tenderness to palpation at right groin, no mass palpated.   Musculoskeletal: M/S 4/5 throughout, Extremities without ischemic changes. Moderate kyphosis.   Neurologic: Pain and light touch intact in extremities, Motor exam as listed above. He is very hard of hearing with hearing aids in place.    DATA  CTA abd/pelvis (09-01-17):  Aorta: Stent graft in the infrarenal abdominal aorta is in stable position. The stent graft is patent. Bilateral limbs are patent. Aneurysm sac in the distal abdominal aorta and aortic bifurcation is again noted. Aneurysm sac at the bifurcation measures 4.0 x 3.1 cm on sequence 5, image 89 and this is essentially unchanged. Right common iliac artery aneurysm sac measures 3.5 cm and this is stable. There is no evidence for an endoleak. Celiac: Celiac trunk is patent. There is mild-to-moderate stenosis at the origin.  SMA: Again noted is a large amount of plaque at the origin of the SMA. There is increased noncalcified plaque at the origin and there is occlusion or near occlusion of the SMA. Superior mesenteric artery beyond the  proximal disease remains patent.  Renals: Atherosclerotic plaque in the bilateral renal arteries. Mild stenosis in bilateral renal arteries. Findings similar to the previous examination.  IMA: Inferior mesenteric artery is occluded at the origin and there is distal reconstitution.  Inflow: The right limb stent extends into the right external iliac artery and patent. Again noted is coil embolization of the right internal iliac artery. Distal right external iliac artery is patent. Left stent limb is patent and terminates in the distal left common iliac artery. Left internal iliac arteries are calcified but patent. Left external artery is tortuous and patent.  Proximal Outflow: Proximal right femoral arteries are patent but heavily diseased.Calcified plaque in the proximal left femoral arteries. Proximal left femoral arteries are patent.   IMPRESSION: VASCULAR  Aortic stent graft is in stable position and patent. Stable size of the aneurysm sac at the aortic bifurcation and right common iliac artery. No evidence for an endoleak.  Increased plaque and stenosis at the origin of the SMA. There is a focal occlusion or near occlusion at the origin of the SMA. This disease has progressed since 2016. The SMA beyond the proximal disease remains patent. Mild-to-moderate stenosis at the origin of celiac artery. Patient is at risk for mesenteric ischemia based on this disease pattern.     CTA Abd/Pelvis Duplex (Date: 07/31/15):  FINDINGS: Since prior imaging, the bifurcated aortic endograft shows stable positioning and normal patency. Thrombosed aneurysm sac surrounding the endograft is stable in size with maximum dimensions of approximately 3.2 x 3.6 cm. There is no evidence of endoleak by CTA.  Thrombosed right common iliac artery aneurysm again noted surrounding endograft limb extension. This aneurysm shows decrease in size from 3.7 cm on the prior CTA to 3.1 cm. There is  stable occlusion of the right internal iliac artery by coils. Native external iliac artery and common femoral artery show stable patency with stable calcified plaque in the common femoral artery and at the right SFA origin causing moderate stenosis. The left iliac limb shows stable patency and positioning, terminating in the distal  left common iliac artery. Native external and common femoral arteries show stable patency on the left.  Visceral arteries show stable atherosclerosis with approximately 50- 60% stenosis at the origin of the celiac axis, 50- 70% stenosis in the proximal superior mesenteric artery and mild stenoses of bilateral renal arteries.  IMPRESSION: 1. Stable patency and positioning of aortic endograft. The aneurysm sac size is stable measuring 3.6 cm in greatest diameter. There is no evidence of endoleak by CTA. 2. Stable exclusion of right common iliac artery aneurysm with  decrease in thrombosed aneurysm sac size to 3.1 cm (previously 3.7 cm). There is stable occlusion of the right internal iliac artery trunk by coils. 3. Stable moderate disease involving the proximal celiac axis, proximal SMA, right common femoral artery and right SFA origin   Medical Decision Making  [name] is a 83 y.o. male who presents s/p EVAR (Date: 2012).  Pt is asymptomatic with stable sac size.   Dr. Donnetta Hutching reviewed CTA abd/pelvis from September 2018 compared to CTA abd/pelvis from 2016, then spoke with pt.   No need to continue surveillance of the stable stent graft on this 83 year old.   Follow up as needed.    Thank you for allowing [place] to participate in this patient's care.  Clemon Chambers, RN, MSN, FNP-C [facility] [place] Office: [phone]  Clinic Physician: Early  09/08/2017, 1:41 PM

## 2017-12-09 DIAGNOSIS — I714 Abdominal aortic aneurysm, without rupture, unspecified: Secondary | ICD-10-CM | POA: Insufficient documentation

## 2017-12-09 DIAGNOSIS — E785 Hyperlipidemia, unspecified: Secondary | ICD-10-CM | POA: Insufficient documentation

## 2017-12-09 DIAGNOSIS — I739 Peripheral vascular disease, unspecified: Secondary | ICD-10-CM | POA: Insufficient documentation

## 2017-12-09 DIAGNOSIS — I2510 Atherosclerotic heart disease of native coronary artery without angina pectoris: Secondary | ICD-10-CM | POA: Insufficient documentation

## 2017-12-09 DIAGNOSIS — I10 Essential (primary) hypertension: Secondary | ICD-10-CM | POA: Insufficient documentation

## 2017-12-24 ENCOUNTER — Ambulatory Visit (INDEPENDENT_AMBULATORY_CARE_PROVIDER_SITE_OTHER): Payer: PRIVATE HEALTH INSURANCE | Admitting: Orthopedic Surgery

## 2018-01-05 ENCOUNTER — Ambulatory Visit: Payer: Medicare Other | Admitting: Cardiovascular Disease

## 2018-01-06 ENCOUNTER — Ambulatory Visit (INDEPENDENT_AMBULATORY_CARE_PROVIDER_SITE_OTHER): Payer: Medicare Other | Admitting: Cardiovascular Disease

## 2018-01-06 ENCOUNTER — Encounter: Payer: Self-pay | Admitting: Cardiovascular Disease

## 2018-01-06 DIAGNOSIS — I10 Essential (primary) hypertension: Secondary | ICD-10-CM

## 2018-01-06 DIAGNOSIS — E7800 Pure hypercholesterolemia, unspecified: Secondary | ICD-10-CM | POA: Diagnosis not present

## 2018-01-06 DIAGNOSIS — I2510 Atherosclerotic heart disease of native coronary artery without angina pectoris: Secondary | ICD-10-CM

## 2018-01-06 DIAGNOSIS — Z9861 Coronary angioplasty status: Secondary | ICD-10-CM

## 2018-01-06 DIAGNOSIS — I714 Abdominal aortic aneurysm, without rupture, unspecified: Secondary | ICD-10-CM

## 2018-01-06 NOTE — Assessment & Plan Note (Signed)
History of hyperlipidemia on statin therapy followed by his PCP 

## 2018-01-06 NOTE — Assessment & Plan Note (Signed)
History of essential hypertension blood pressure is 166/72. He is on metoprolol. Continue current meds at current dosing.

## 2018-01-06 NOTE — Assessment & Plan Note (Signed)
istory of abdominal aortic aneurysm status post endoluminal stent grafting by Dr. Donnetta Hutching in 2011.

## 2018-01-06 NOTE — Patient Instructions (Signed)

## 2018-01-06 NOTE — Assessment & Plan Note (Signed)
History of CAD status post RCA stenting using a Taxus drug-eluting stent by Dr. Rollene Fare 2006 with moderate circumflex disease and normal LV function.Here negative Myoview stress test several years ago and denies chest pain or shortness of breath.

## 2018-01-06 NOTE — Progress Notes (Signed)
01/06/2018 [name]   06/05/1918  [id number]  Primary Physician Nolene Ebbs, MD Primary Cardiologist: Lorretta Harp MD [name], [place]  HPI:  [name] is a 84 y.o.  widowed Caucasian male father of one daughter who is accompanied by his caregiver [name] today.Marland Kitchen He was formally patient of Dr. Lowella Fairy. I am assumed his care. I last saw him in the office 12/11/15. He is retired from [employer] as a Teacher, early years/pre. His daughter lives next-door to him. The patient lives independently. He has a history of remote tobacco abuse, hypertension and hyperlipidemia. He has not had a heart attack or stroke. He does have known coronary artery disease status post RCA stenting using a Taxus drug-eluting stent by Dr. Rollene Fare back in 2006 with moderate circumflex disease and normal LV function. He had anegative Myoview stress test several years ago and denies chest pain or shortness of breath. He did have a history of abdominal aortic aneurysm and had endoluminal stent grafting by Dr. Sherren Mocha Early back in 2011. He also has asymptomatic moderate left internal carotid artery stenosis which we followed by duplex ultrasound. His most recent carotid Doppler performed 12/16/13 revealed moderate bilateral ICA stenosis. Since I saw him one year ago he's remained stable. He denies chest pain or shortness of breath. He is significantly hard of hearing.     Current Meds  Medication Sig  . acetaminophen (TYLENOL) 500 MG tablet Take 500 mg by mouth every 6 (six) hours as needed for mild pain.  Marland Kitchen alfuzosin (UROXATRAL) 10 MG 24 hr tablet Take 10 mg by mouth every evening.   Marland Kitchen ALPRAZolam (XANAX) 0.25 MG tablet Take 0.25 mg by mouth 2 (two) times daily.   Marland Kitchen aspirin EC 81 MG tablet Take 81 mg by mouth daily.  Marland Kitchen atorvastatin (LIPITOR) 20 MG tablet Take 20 mg by mouth daily at 6 PM.   . cetirizine (ZYRTEC) 10 MG tablet Take 10 mg by mouth daily.  . clopidogrel (PLAVIX) 75 MG tablet Take 75 mg by mouth  daily. Will stop prior to procedure  . diclofenac sodium (VOLTAREN) 1 % GEL Apply 4 g topically 2 (two) times daily as needed (pain).   . ferrous sulfate 325 (65 FE) MG tablet Take 325 mg by mouth daily with breakfast.  . lansoprazole (PREVACID) 15 MG capsule Take 15 mg by mouth daily. 2 hours before meal  . meclizine (ANTIVERT) 25 MG tablet Take 25 mg by mouth every 6 (six) hours as needed for dizziness.  . metoprolol succinate (TOPROL-XL) 25 MG 24 hr tablet Take 12.5 mg by mouth 2 (two) times daily.   . Multiple Vitamin (MULTIVITAMIN PO) Take 1 tablet by mouth daily.   . Multiple Vitamins-Minerals (ICAPS MV PO) Take 1 capsule by mouth at bedtime.   . polyethylene glycol powder (GLYCOLAX/MIRALAX) powder Take 17 g by mouth every evening.   . pregabalin (LYRICA) 50 MG capsule Take 50 mg by mouth at bedtime.  . Psyllium (METAMUCIL PO) Take 1 scoop by mouth daily.  . tamsulosin (FLOMAX) 0.4 MG CAPS capsule Take 0.4 mg by mouth daily.   . traMADol (ULTRAM) 50 MG tablet Take 50 mg by mouth 2 (two) times daily as needed for moderate pain.      Allergies  Allergen Reactions  . Hydrocodone Anaphylaxis and Nausea And Vomiting  . Oxycodone Nausea And Vomiting    Social History   Socioeconomic History  . Marital status: Widowed    Spouse name: Not  on file  . Number of children: Not on file  . Years of education: Not on file  . Highest education level: Not on file  Social Needs  . Financial resource strain: Not on file  . Food insecurity - worry: Not on file  . Food insecurity - inability: Not on file  . Transportation needs - medical: Not on file  . Transportation needs - non-medical: Not on file  Occupational History  . Not on file  Tobacco Use  . Smoking status: Former Smoker    Types: Cigarettes    Last attempt to quit: 12/08/1990    Years since quitting: 27.0  . Smokeless tobacco: Former Systems developer    Types: [place] date: 09/13/2014  Substance and Sexual Activity  . Alcohol use: No   . Drug use: No  . Sexual activity: Not on file  Other Topics Concern  . Not on file  Social History Narrative  . Not on file     Review of Systems: General: negative for chills, fever, night sweats or weight changes.  Cardiovascular: negative for chest pain, dyspnea on exertion, edema, orthopnea, palpitations, paroxysmal nocturnal dyspnea or shortness of breath Dermatological: negative for rash Respiratory: negative for cough or wheezing Urologic: negative for hematuria Abdominal: negative for nausea, vomiting, diarrhea, bright red blood per rectum, melena, or hematemesis Neurologic: negative for visual changes, syncope, or dizziness All other systems reviewed and are otherwise negative except as noted above.    Blood pressure (!) 166/72, pulse 65, height 5\' 10"  (1.778 m), weight 154 lb (69.9 kg).  General appearance: alert and no distress Neck: no adenopathy, no JVD, supple, symmetrical, trachea midline, thyroid not enlarged, symmetric, no tenderness/mass/nodules and soft right carotid bruit Lungs: clear to auscultation bilaterally Heart: regular rate and rhythm, S1, S2 normal, no murmur, click, rub or gallop Extremities: extremities normal, atraumatic, no cyanosis or edema Pulses: 2+ and symmetric Skin: Skin color, texture, turgor normal. No rashes or lesions Neurologic: Alert and oriented X 3, normal strength and tone. Normal symmetric reflexes. Normal coordination and gait  EKG sinus rhythm at 65 with left anterior fascicular block and borderline LVH voltage. I personally reviewed this EKG.  ASSESSMENT AND PLAN:   HYPERCHOLESTEROLEMIA History of hyperlipidemia on statin therapy followed by his PCP  Essential hypertension History of essential hypertension blood pressure is 166/72. He is on metoprolol. Continue current meds at current dosing.  CAD S/P RCA stent '06 History of CAD status post RCA stenting using a Taxus drug-eluting stent by Dr. Rollene Fare 2006 with moderate  circumflex disease and normal LV function.Here negative Myoview stress test several years ago and denies chest pain or shortness of breath.  Abdominal aortic aneurysm istory of abdominal aortic aneurysm status post endoluminal stent grafting by Dr. Donnetta Hutching in 2011.      Lorretta Harp MD FACP,FACC,FAHA, [facility] 01/06/2018 2:48 PM

## 2018-01-16 IMAGING — CR DG CHEST 1V PORT
1 series · 1 of 1 positions shown · non-contrast
Comparison: 11/01/2014

CLINICAL DATA: Chest pain earlier today. Low oxygen level. Patient
started feeling sick to the stomach after eating at [employer]
evening. Diarrhea.

EXAM:
PORTABLE CHEST 1 VIEW

[portable]
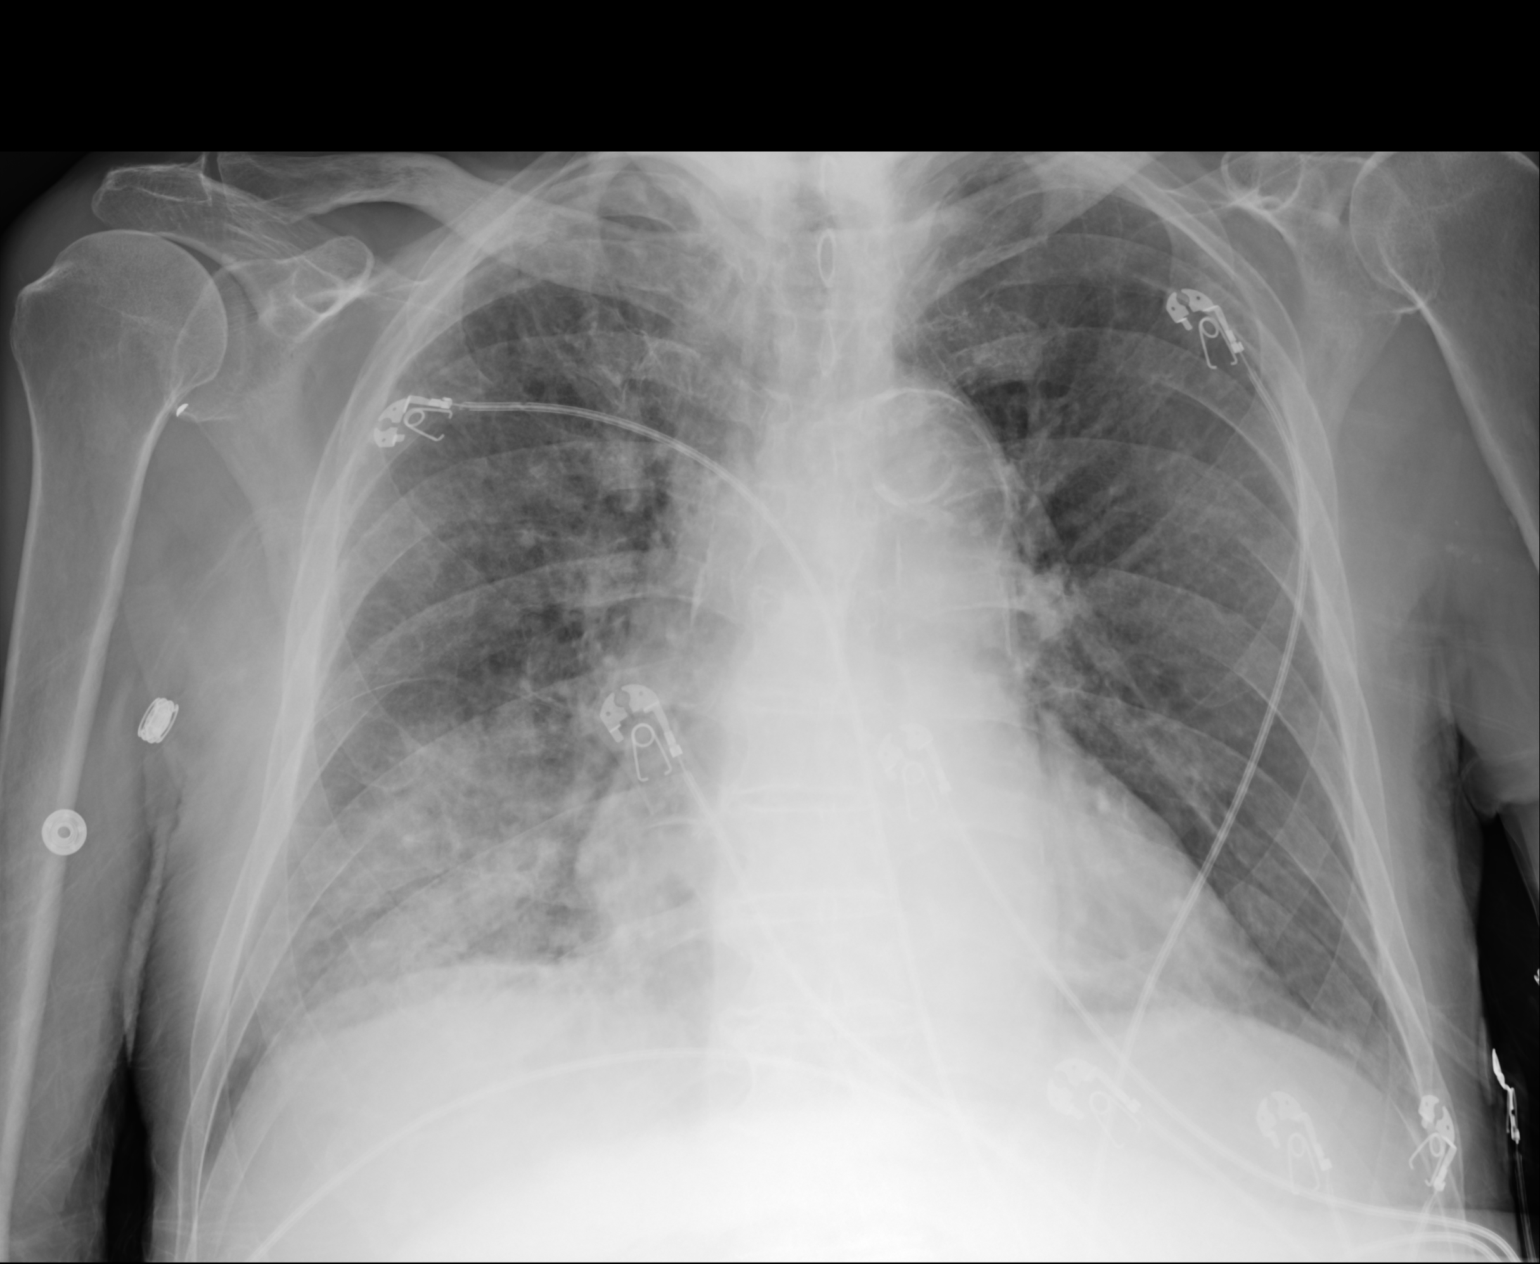

[1 of 1 positions shown; findings below may reference images not displayed]

FINDINGS: Borderline heart size with normal pulmonary vascularity. There is
infiltration throughout the right lung most prominent in the right
lower lobe. This may be due to pneumonia or asymmetrical edema. The
left lung is generally clear. No pneumothorax. No blunting of
costophrenic angles. Calcified and tortuous aorta.
IMPRESSION: Infiltration in the right lung, most prominent in the right lung
base, suggesting pneumonia or asymmetric edema.

## 2018-01-27 ENCOUNTER — Telehealth (INDEPENDENT_AMBULATORY_CARE_PROVIDER_SITE_OTHER): Payer: Self-pay | Admitting: Orthopedic Surgery

## 2018-01-27 NOTE — Telephone Encounter (Signed)
Patient's caregiver [name]) called advised the medication that was prescribed for restless legs is not working. She advised will discuss at the appointment. The number to contact [name] is [phone]

## 2018-01-27 NOTE — Telephone Encounter (Signed)
Pt not seen since 05/2017 Can discuss at [place]

## 2018-02-03 NOTE — Addendum Note (Signed)
Addended by: Therisa Doyne on: 02/03/2018 04:34 PM   Modules accepted: Orders

## 2018-02-04 ENCOUNTER — Encounter (INDEPENDENT_AMBULATORY_CARE_PROVIDER_SITE_OTHER): Payer: Self-pay | Admitting: Orthopedic Surgery

## 2018-02-04 ENCOUNTER — Ambulatory Visit (INDEPENDENT_AMBULATORY_CARE_PROVIDER_SITE_OTHER): Payer: Medicare Other | Admitting: Orthopedic Surgery

## 2018-02-04 DIAGNOSIS — M1712 Unilateral primary osteoarthritis, left knee: Secondary | ICD-10-CM

## 2018-02-04 DIAGNOSIS — M1711 Unilateral primary osteoarthritis, right knee: Secondary | ICD-10-CM

## 2018-02-05 ENCOUNTER — Encounter (INDEPENDENT_AMBULATORY_CARE_PROVIDER_SITE_OTHER): Payer: Self-pay | Admitting: Orthopedic Surgery

## 2018-02-05 DIAGNOSIS — M1711 Unilateral primary osteoarthritis, right knee: Secondary | ICD-10-CM | POA: Diagnosis not present

## 2018-02-05 MED ORDER — METHYLPREDNISOLONE ACETATE 40 MG/ML IJ SUSP
40.0000 mg | INTRAMUSCULAR | Status: AC | PRN
  Administered 2018-02-05: 40 mg via INTRA_ARTICULAR

## 2018-02-05 MED ORDER — LIDOCAINE HCL 1 % IJ SOLN
5.0000 mL | INTRAMUSCULAR | Status: AC | PRN
  Administered 2018-02-05: 5 mL

## 2018-02-05 MED ORDER — BUPIVACAINE HCL 0.25 % IJ SOLN
4.0000 mL | INTRAMUSCULAR | Status: AC | PRN
  Administered 2018-02-05: 4 mL via INTRA_ARTICULAR

## 2018-02-05 NOTE — Progress Notes (Signed)
Office Visit Note   Patient: [name]           Date of Birth: Dec 26, 1917           MRN: [id number] Visit Date: 02/04/2018 Requested by: Nolene Ebbs, MD [address] [place], [place] [address] PCP: Nolene Ebbs, MD  Subjective: Chief Complaint  Patient presents with  . Right Knee - Follow-up    HPI: Aniruddh is a patient with known right knee arthritis.  Is not severe.  Been doing well but reports some recurrent pain in his knee.  He has known right knee arthritis and left knee arthritis.  He has done well with aspiration and injections in the past he is here with his caregiver today              ROS: All systems reviewed are negative as they relate to the chief complaint within the history of present illness.  Patient denies  fevers or chills.   Assessment & Plan: Visit Diagnoses:  1. Unilateral primary osteoarthritis, left knee   2. Unilateral primary osteoarthritis, right knee     Plan: Pression is right knee arthritis.  Plan is aspiration and injection.  We could continue to do this 2-3 times a year. f he is using a walker.  Follow-up with me as needed.  Follow-Up Instructions: Return if symptoms worsen or fail to improve.   Orders:  No orders of the defined types were placed in this encounter.  No orders of the defined types were placed in this encounter.     Procedures: Large Joint Inj: R knee on 02/05/2018 10:36 PM Indications: diagnostic evaluation, joint swelling and pain Details: 18 G 1.5 in needle, superolateral approach  Arthrogram: No  Medications: 5 mL lidocaine 1 %; 40 mg methylPREDNISolone acetate 40 MG/ML; 4 mL bupivacaine 0.25 % Outcome: tolerated well, no immediate complications Procedure, treatment alternatives, risks and benefits explained, specific risks discussed. Consent was given by the patient. Immediately prior to procedure a time out was called to verify the correct patient, procedure, equipment, support staff and site/side marked as  required. Patient was prepped and draped in the usual sterile fashion.       Clinical Data: No additional findings.  Objective: Vital Signs: There were no vitals taken for this visit.  Physical Exam:   Constitutional: Patient appears well-developed HEENT:  Head: Normocephalic Eyes:EOM are normal Neck: Normal range of motion Cardiovascular: Normal rate Pulmonary/chest: Effort normal Neurologic: Patient is alert Skin: Skin is warm Psychiatric: Patient has normal mood and affect    Ortho Exam: Orthopedic exam demonstrates right knee mild effusion and mild flexion contracture to 5 degrees.  Can bend the knee to about 100 degrees.  Pedal pulses are palpable bilaterally.  No loss of right knee extension.  Medial and lateral joint line tenderness is present.  Specialty Comments:  No specialty comments available.  Imaging: No results found.   PMFS History: Patient Active Problem List   Diagnosis Date Noted  . CAD (coronary artery disease)   . Hyperlipidemia   . Hypertension   . Peripheral vascular disease ([place])   . AAA (abdominal aortic aneurysm) ([place])   . Acute pain of right knee 01/23/2017  . Right hip pain 01/23/2017  . Acute respiratory failure with hypoxia ([facility]) 10/29/2016  . Diarrhea 10/29/2016  . CAP (community acquired pneumonia) 10/29/2016  . Odynophagia 09/23/2016  . Community acquired pneumonia 09/07/2014  . Sepsis ([place]) 09/07/2014  . Acute encephalopathy 09/07/2014  . Thrombocytopenia ([place])  09/07/2014  . Carotid stenosis 07/14/2014  . External hemorrhoid 02/28/2013  . Weakness generalized 02/28/2013  . Hyponatremia 02/18/2013  . Abdominal aortic aneurysm ([place]) 10/12/2012  . DYSPHAGIA 11/15/2009  . CONSTIPATION, CHRONIC 08/10/2009  . HEMATOCHEZIA 08/10/2009  . HYPERCHOLESTEROLEMIA 08/09/2009  . MACULAR DEGENERATION 08/09/2009  . Essential hypertension 08/09/2009  . CAD S/P RCA stent '06 08/09/2009  . TIA 08/09/2009  . GERD 08/09/2009  . ARTHRITIS  08/09/2009  . BENIGN PROSTATIC HYPERTROPHY, HX OF 08/09/2009   Past Medical History:  Diagnosis Date  . AAA (abdominal aortic aneurysm) ([place])   . Anemia   . Anemia   . Arthritis   . Arthritis   . CAD (coronary artery disease)   . Cancer ([place]) 2016   skin caner: Forehead  . GERD (gastroesophageal reflux disease)   . Hiatal hernia   . Hyperlipidemia   . Hypertension   . Macular degeneration   . Peripheral vascular disease ([place])   . Trouble swallowing     Family History  Problem Relation Age of Onset  . Heart failure Mother   . Diabetes Mother   . Heart disease Mother   . Pneumonia Father   . Cancer Brother   . Diabetes Brother   . Heart disease Brother     Past Surgical History:  Procedure Laterality Date  . ANGIOPLASTY / STENTING ILIAC  03-2011   Right Iliac - Gore Stent graft   . BACK SURGERY  09-05-2011    . COLONOSCOPY  08/2009   Rourk: report unavailable, two tubular adenomas and acute ileitis on path  . CORONARY ANGIOPLASTY WITH STENT PLACEMENT    . ESOPHAGOGASTRODUODENOSCOPY  2008   Rourk: Critical Schazki ring s/p dilation, hiatal hernia  . ESOPHAGOGASTRODUODENOSCOPY  09/2005   Rourk: S-shaped distal esophageal erosion, middle to distal esopahgus s/p multiple biopsies (unavailable)  . ESOPHAGOGASTRODUODENOSCOPY  06/2005   Rourk: solitary mid esophageal lesion s/p bx (low grade dysplasia), prominent schatzki ring, large hh  . ESOPHAGOGASTRODUODENOSCOPY  11/2009   Rourk: Schatzki ring s/p dilation  . ESOPHAGOGASTRODUODENOSCOPY (EGD) WITH PROPOFOL N/A 11/20/2016   Procedure: ESOPHAGOGASTRODUODENOSCOPY (EGD) WITH PROPOFOL;  Surgeon: Daneil Dolin, MD;  [facility];  Service: Endoscopy;  Laterality: N/A;  830  . EUS     Mishra:  . MALONEY DILATION  11/20/2016   Procedure: Venia Minks DILATION;  Surgeon: Daneil Dolin, MD;  Location: [facility];  Service: Endoscopy;;  . SPINE SURGERY    . TONSILLECTOMY     Social History   Occupational History  .  Not on file  Tobacco Use  . Smoking status: Former Smoker    Types: Cigarettes    Last attempt to quit: 12/08/1990    Years since quitting: 27.1  . Smokeless tobacco: Former Systems developer    Types: [place] date: 09/13/2014  Substance and Sexual Activity  . Alcohol use: No  . Drug use: No  . Sexual activity: Not on file

## 2019-04-14 ENCOUNTER — Other Ambulatory Visit: Payer: Self-pay

## 2019-04-14 ENCOUNTER — Encounter (HOSPITAL_COMMUNITY): Payer: Self-pay | Admitting: Emergency Medicine

## 2019-04-14 ENCOUNTER — Emergency Department (HOSPITAL_COMMUNITY)
Admission: EM | Admit: 2019-04-14 | Discharge: 2019-05-09 | Disposition: E | Payer: Medicare Other | Attending: Emergency Medicine | Admitting: Emergency Medicine

## 2019-04-14 DIAGNOSIS — Z87891 Personal history of nicotine dependence: Secondary | ICD-10-CM | POA: Insufficient documentation

## 2019-04-14 DIAGNOSIS — Z79899 Other long term (current) drug therapy: Secondary | ICD-10-CM | POA: Diagnosis not present

## 2019-04-14 DIAGNOSIS — I469 Cardiac arrest, cause unspecified: Secondary | ICD-10-CM

## 2019-04-14 DIAGNOSIS — Z7982 Long term (current) use of aspirin: Secondary | ICD-10-CM | POA: Insufficient documentation

## 2019-04-14 DIAGNOSIS — Z85828 Personal history of other malignant neoplasm of skin: Secondary | ICD-10-CM | POA: Insufficient documentation

## 2019-04-14 DIAGNOSIS — I2510 Atherosclerotic heart disease of native coronary artery without angina pectoris: Secondary | ICD-10-CM | POA: Diagnosis not present

## 2019-05-09 NOTE — Progress Notes (Signed)
Present with Family present as they were processing their grief. We prayed together and offered listening presence with Mr Rawlinson daughter, Mier, his son-in-law, Mendel Ryder and his caregiver, [name].

## 2019-05-09 NOTE — ED Triage Notes (Signed)
Pt c/o of left leg pain then went unresponsive.

## 2019-05-09 NOTE — ED Provider Notes (Signed)
[facility] EMERGENCY DEPARTMENT Provider Note   CSN: [id number] Arrival date & time: 05-03-2019  1333  LEVEL 5 CAVEAT - ONGOING CPR  History   Chief Complaint Chief Complaint  Patient presents with  . Cardiac Arrest    HPI [name] is a 85 y.o. male.     HPI  85 year old male brought in with ongoing CPR.  History is obtained through EMS and later through daughter and caregiver.  Initially, patient woke up and was doing okay but complained of some left leg pain.  Ate a full breakfast.  He then vomited and EMS was called.  About 5 or 6 minutes later he stopped breathing and caregiver started CPR.  EMS has been doing CPR since 12:50 PM.  They have been able to get pulses back transiently but then lost them.  At one point he was placed on IV dopamine.  He has been given 7 epinephrines.  Currently the patient is pulseless and CPR is ongoing.  Reportedly he was not sick prior to this morning.   Past Medical History:  Diagnosis Date  . AAA (abdominal aortic aneurysm) ([place])   . Anemia   . Anemia   . Arthritis   . Arthritis   . CAD (coronary artery disease)   . Cancer ([place]) 2016   skin caner: Forehead  . GERD (gastroesophageal reflux disease)   . Hiatal hernia   . Hyperlipidemia   . Hypertension   . Macular degeneration   . Peripheral vascular disease ([place])   . Trouble swallowing     Patient Active Problem List   Diagnosis Date Noted  . CAD (coronary artery disease)   . Hyperlipidemia   . Hypertension   . Peripheral vascular disease ([place])   . AAA (abdominal aortic aneurysm) ([place])   . Acute pain of right knee 01/23/2017  . Right hip pain 01/23/2017  . Acute respiratory failure with hypoxia ([place]) 10/29/2016  . Diarrhea 10/29/2016  . CAP (community acquired pneumonia) 10/29/2016  . Odynophagia 09/23/2016  . Community acquired pneumonia 09/07/2014  . Sepsis ([place]) 09/07/2014  . Acute encephalopathy 09/07/2014  . Thrombocytopenia ([place]) 09/07/2014  . Carotid stenosis  07/14/2014  . External hemorrhoid 02/28/2013  . Weakness generalized 02/28/2013  . Hyponatremia 02/18/2013  . Abdominal aortic aneurysm ([place]) 10/12/2012  . DYSPHAGIA 11/15/2009  . CONSTIPATION, CHRONIC 08/10/2009  . HEMATOCHEZIA 08/10/2009  . HYPERCHOLESTEROLEMIA 08/09/2009  . MACULAR DEGENERATION 08/09/2009  . Essential hypertension 08/09/2009  . CAD S/P RCA stent '06 08/09/2009  . TIA 08/09/2009  . GERD 08/09/2009  . ARTHRITIS 08/09/2009  . BENIGN PROSTATIC HYPERTROPHY, HX OF 08/09/2009    Past Surgical History:  Procedure Laterality Date  . ANGIOPLASTY / STENTING ILIAC  03-2011   Right Iliac - Gore Stent graft   . BACK SURGERY  09-05-2011    . COLONOSCOPY  08/2009   Rourk: report unavailable, two tubular adenomas and acute ileitis on path  . CORONARY ANGIOPLASTY WITH STENT PLACEMENT    . ESOPHAGOGASTRODUODENOSCOPY  2008   Rourk: Critical Schazki ring s/p dilation, hiatal hernia  . ESOPHAGOGASTRODUODENOSCOPY  09/2005   Rourk: S-shaped distal esophageal erosion, middle to distal esopahgus s/p multiple biopsies (unavailable)  . ESOPHAGOGASTRODUODENOSCOPY  06/2005   Rourk: solitary mid esophageal lesion s/p bx (low grade dysplasia), prominent schatzki ring, large hh  . ESOPHAGOGASTRODUODENOSCOPY  11/2009   Rourk: Schatzki ring s/p dilation  . ESOPHAGOGASTRODUODENOSCOPY (EGD) WITH PROPOFOL N/A 11/20/2016   Procedure: ESOPHAGOGASTRODUODENOSCOPY (EGD) WITH PROPOFOL;  Surgeon: Daneil Dolin,  MD;  [facility];  Service: Endoscopy;  Laterality: N/A;  830  . EUS     Mishra:  . MALONEY DILATION  11/20/2016   Procedure: Venia Minks DILATION;  Surgeon: Daneil Dolin, MD;  Location[facility];  Service: Endoscopy;;  . SPINE SURGERY    . TONSILLECTOMY          Home Medications    Prior to Admission medications   Medication Sig Start Date End Date Taking? Authorizing Provider  acetaminophen (TYLENOL) 500 MG tablet Take 500 mg by mouth every 6 (six) hours as needed  for mild pain.    [provider]  alfuzosin (UROXATRAL) 10 MG 24 hr tablet Take 10 mg by mouth every evening.     [provider]  ALPRAZolam Duanne Moron) 0.25 MG tablet Take 0.25 mg by mouth 2 (two) times daily.     [provider]  aspirin EC 81 MG tablet Take 81 mg by mouth daily.    [provider]  atorvastatin (LIPITOR) 20 MG tablet Take 20 mg by mouth daily at 6 PM.     [provider]  cetirizine (ZYRTEC) 10 MG tablet Take 10 mg by mouth daily.    [provider]  clopidogrel (PLAVIX) 75 MG tablet Take 75 mg by mouth daily. Will stop prior to procedure    [provider]  diclofenac sodium (VOLTAREN) 1 % GEL Apply 4 g topically 2 (two) times daily as needed (pain).     [provider]  ferrous sulfate 325 (65 FE) MG tablet Take 325 mg by mouth daily with breakfast.    [provider]  lansoprazole (PREVACID) 15 MG capsule Take 15 mg by mouth daily. 2 hours before meal    [provider]  meclizine (ANTIVERT) 25 MG tablet Take 25 mg by mouth every 6 (six) hours as needed for dizziness.    [provider]  metoprolol succinate (TOPROL-XL) 25 MG 24 hr tablet Take 12.5 mg by mouth 2 (two) times daily.  11/21/13   [provider]  Multiple Vitamin (MULTIVITAMIN PO) Take 1 tablet by mouth daily.     [provider]  Multiple Vitamins-Minerals (ICAPS MV PO) Take 1 capsule by mouth at bedtime.     [provider]  polyethylene glycol powder (GLYCOLAX/MIRALAX) powder Take 17 g by mouth every evening.     [provider]  pregabalin (LYRICA) 50 MG capsule Take 50 mg by mouth at bedtime.    [provider]  Psyllium (METAMUCIL PO) Take 1 scoop by mouth daily.    [provider]  tamsulosin (FLOMAX) 0.4 MG CAPS capsule Take 0.4 mg by mouth daily.  11/10/16   [provider]  traMADol (ULTRAM) 50 MG tablet Take 50 mg by mouth 2 (two) times daily as  needed for moderate pain.     [provider]    Family History Family History  Problem Relation Age of Onset  . Heart failure Mother   . Diabetes Mother   . Heart disease Mother   . Pneumonia Father   . Cancer Brother   . Diabetes Brother   . Heart disease Brother     Social History Social History   Tobacco Use  . Smoking status: Former Smoker    Types: Cigarettes    Last attempt to quit: 12/08/1990    Years since quitting: 28.3  . Smokeless tobacco: Former Systems developer    Types: Risk analyst  date: 09/13/2014  Substance Use Topics  . Alcohol use: No  . Drug use: No     Allergies   Hydrocodone and Oxycodone   Review of Systems Review of Systems  Unable to perform ROS: Acuity of condition     Physical Exam Updated Vital Signs There were no vitals taken for this visit.  Physical Exam Vitals signs and nursing note reviewed.  Constitutional:      Appearance: He is well-developed.     Interventions: He is intubated Edison Pace airway).  HENT:     Head: Normocephalic and atraumatic.     Right Ear: External ear normal.     Left Ear: External ear normal.     Nose: Nose normal.  Eyes:     General:        Right eye: No discharge.        Left eye: No discharge.  Neck:     Musculoskeletal: Neck supple.  Pulmonary:     Effort: He is intubated Edison Pace airway).  Abdominal:     General: There is no distension.     Palpations: Abdomen is soft.  Musculoskeletal:     Right lower leg: Edema present.     Left lower leg: Edema present.  Skin:    General: Skin is warm and dry.     Comments: Diffuse discoloration/ecchymosis, especially abdomen and arms that is related to prior meds per family  Neurological:     Mental Status: He is unresponsive.     GCS: GCS eye subscore is 1. GCS verbal subscore is 1. GCS motor subscore is 1.  Psychiatric:        Mood and Affect: Mood is not anxious.      ED Treatments / Results  Labs (all labs ordered are listed, but only abnormal  results are displayed) Labs Reviewed - No data to display  EKG None  Radiology No results found.  Procedures Procedures (including critical care time)  Cardiopulmonary Resuscitation (CPR) Procedure Note Directed/Performed by: Ephraim Hamburger I personally directed ancillary staff and/or performed CPR in an effort to regain return of spontaneous circulation and to maintain cardiac, neuro and systemic perfusion.    Medications Ordered in ED Medications - No data to display   Initial Impression / Assessment and Plan / ED Course  I have reviewed the triage vital signs and the nursing notes.  Pertinent labs & imaging results that were available during my care of the patient were reviewed by me and considered in my medical decision making (see chart for details).        Patient presents in active cardiac arrest with CPR ongoing.  Given he had active CPR for about 40 minutes or more, currently having asystole, and large amounts of epinephrine having already been given combined with his age, I think the chance of recovery with any type of neurologic function is extremely low.  Bedside ultrasound was difficult due to body habitus but I do not see an obvious pericardial effusion and also no cardiac activity.  Differential is very long but at this point, CPR was stopped and he was pronounced at 1337.  I discussed with the daughter, son-in-law, and caregiver and discussed his death and next steps.  Given sudden vomiting and diaphoresis, probably large MI given prior history.  Final Clinical Impressions(s) / ED Diagnoses   Final diagnoses:  Cardiac arrest [facility])    ED Discharge Orders    None       Sherwood Gambler, MD May 09, 2019  1356  

## 2019-05-09 NOTE — ED Notes (Signed)
Pt belongings given to family, pt wallet was inside of his shoes, returned to family: 2 brown leather shoes with black bottoms and leather wallet

## 2019-05-09 NOTE — ED Notes (Signed)
Beth RN given pt's family member pt's shoes

## 2019-05-09 NOTE — ED Notes (Signed)
Have paged [facility] about bringing down death certificate.

## 2019-05-09 DEATH — deceased

## 2020-08-07 ENCOUNTER — Telehealth: Payer: Self-pay

## 2020-08-07 NOTE — Telephone Encounter (Signed)
Patient' daughter came in and had her executor of the estate of her father, She wanted medical records on her father. I printed them and gave them to her while she was here in the office.   [facility]

## 2020-08-16 ENCOUNTER — Telehealth: Payer: Self-pay | Admitting: Orthopedic Surgery

## 2020-08-16 NOTE — Telephone Encounter (Signed)
Received call from pts former wife checking on request for records. I advised her I need copy of death certificate and letter of testimony. She said she had those documents and will bring them in today. At that time I will process request.
